# Patient Record
Sex: Female | Born: 1935 | Race: White | Hispanic: No | Marital: Married | Smoking: Never smoker
Health system: Southern US, Community
[De-identification: ages and names within clinical notes are randomized; demographics above are authoritative.]

## PROBLEM LIST (undated history)

## (undated) DIAGNOSIS — IMO0001 Reserved for inherently not codable concepts without codable children: Secondary | ICD-10-CM

## (undated) DIAGNOSIS — I10 Essential (primary) hypertension: Secondary | ICD-10-CM

## (undated) DIAGNOSIS — Z972 Presence of dental prosthetic device (complete) (partial): Secondary | ICD-10-CM

## (undated) DIAGNOSIS — E039 Hypothyroidism, unspecified: Secondary | ICD-10-CM

## (undated) DIAGNOSIS — Z8639 Personal history of other endocrine, nutritional and metabolic disease: Secondary | ICD-10-CM

## (undated) DIAGNOSIS — Z974 Presence of external hearing-aid: Secondary | ICD-10-CM

## (undated) DIAGNOSIS — E079 Disorder of thyroid, unspecified: Secondary | ICD-10-CM

## (undated) DIAGNOSIS — K2270 Barrett's esophagus without dysplasia: Secondary | ICD-10-CM

## (undated) DIAGNOSIS — L814 Other melanin hyperpigmentation: Secondary | ICD-10-CM

## (undated) DIAGNOSIS — K219 Gastro-esophageal reflux disease without esophagitis: Secondary | ICD-10-CM

## (undated) DIAGNOSIS — I499 Cardiac arrhythmia, unspecified: Secondary | ICD-10-CM

## (undated) DIAGNOSIS — G459 Transient cerebral ischemic attack, unspecified: Secondary | ICD-10-CM

## (undated) DIAGNOSIS — I4891 Unspecified atrial fibrillation: Secondary | ICD-10-CM

## (undated) HISTORY — DX: Transient cerebral ischemic attack, unspecified: G45.9

## (undated) HISTORY — DX: Barrett's esophagus without dysplasia: K22.70

## (undated) HISTORY — DX: Disorder of thyroid, unspecified: E07.9

## (undated) HISTORY — PX: THYROID SURGERY: SHX805

## (undated) HISTORY — DX: Gastro-esophageal reflux disease without esophagitis: K21.9

## (undated) HISTORY — DX: Reserved for inherently not codable concepts without codable children: IMO0001

## (undated) HISTORY — DX: Personal history of other endocrine, nutritional and metabolic disease: Z86.39

## (undated) HISTORY — DX: Other melanin hyperpigmentation: L81.4

## (undated) HISTORY — PX: ABDOMINAL HYSTERECTOMY: SHX81

---

## 2004-10-12 ENCOUNTER — Emergency Department: Payer: Self-pay | Admitting: Emergency Medicine

## 2005-03-18 ENCOUNTER — Ambulatory Visit: Payer: Self-pay | Admitting: Internal Medicine

## 2006-03-26 ENCOUNTER — Ambulatory Visit: Payer: Self-pay | Admitting: Infectious Diseases

## 2006-04-18 ENCOUNTER — Other Ambulatory Visit: Payer: Self-pay

## 2006-04-18 ENCOUNTER — Ambulatory Visit: Payer: Self-pay | Admitting: Infectious Diseases

## 2006-07-22 HISTORY — PX: HERNIA REPAIR: SHX51

## 2006-12-15 ENCOUNTER — Ambulatory Visit: Payer: Self-pay | Admitting: Surgery

## 2006-12-22 ENCOUNTER — Ambulatory Visit: Payer: Self-pay | Admitting: Surgery

## 2007-04-01 ENCOUNTER — Ambulatory Visit: Payer: Self-pay | Admitting: Internal Medicine

## 2008-04-22 ENCOUNTER — Ambulatory Visit: Payer: Self-pay | Admitting: Internal Medicine

## 2009-03-30 ENCOUNTER — Ambulatory Visit: Payer: Self-pay | Admitting: Family

## 2009-04-17 LAB — HM COLONOSCOPY: HM Colonoscopy: NORMAL

## 2009-04-20 ENCOUNTER — Ambulatory Visit: Payer: Self-pay | Admitting: Gastroenterology

## 2009-05-16 ENCOUNTER — Ambulatory Visit: Payer: Self-pay | Admitting: Internal Medicine

## 2009-10-11 ENCOUNTER — Ambulatory Visit: Payer: Self-pay | Admitting: Internal Medicine

## 2010-05-18 ENCOUNTER — Ambulatory Visit: Payer: Self-pay | Admitting: Internal Medicine

## 2010-05-28 ENCOUNTER — Ambulatory Visit: Payer: Self-pay | Admitting: Gastroenterology

## 2010-12-21 DIAGNOSIS — G459 Transient cerebral ischemic attack, unspecified: Secondary | ICD-10-CM

## 2010-12-21 HISTORY — DX: Transient cerebral ischemic attack, unspecified: G45.9

## 2011-01-08 ENCOUNTER — Ambulatory Visit: Payer: Self-pay | Admitting: Internal Medicine

## 2011-01-14 ENCOUNTER — Ambulatory Visit: Payer: Self-pay | Admitting: Vascular Surgery

## 2011-01-20 DIAGNOSIS — Z8639 Personal history of other endocrine, nutritional and metabolic disease: Secondary | ICD-10-CM

## 2011-01-20 HISTORY — DX: Personal history of other endocrine, nutritional and metabolic disease: Z86.39

## 2011-02-04 ENCOUNTER — Ambulatory Visit: Payer: Self-pay | Admitting: Internal Medicine

## 2011-02-19 ENCOUNTER — Ambulatory Visit: Payer: Self-pay | Admitting: Unknown Physician Specialty

## 2011-02-20 LAB — PATHOLOGY REPORT

## 2011-03-28 ENCOUNTER — Ambulatory Visit (INDEPENDENT_AMBULATORY_CARE_PROVIDER_SITE_OTHER): Payer: 59 | Admitting: Internal Medicine

## 2011-03-28 VITALS — BP 169/91 | HR 88

## 2011-03-28 DIAGNOSIS — I10 Essential (primary) hypertension: Secondary | ICD-10-CM

## 2011-03-28 NOTE — Progress Notes (Signed)
  Subjective:    Patient ID: Tiffany Grimes, female    DOB: [birth year], 77 y.o.   MRN: [id number]  HPI    Review of Systems     Objective:   Physical Exam        Assessment & Plan:

## 2011-03-29 DIAGNOSIS — I10 Essential (primary) hypertension: Secondary | ICD-10-CM | POA: Insufficient documentation

## 2011-04-01 ENCOUNTER — Encounter: Payer: Self-pay | Admitting: Internal Medicine

## 2011-04-01 ENCOUNTER — Ambulatory Visit (INDEPENDENT_AMBULATORY_CARE_PROVIDER_SITE_OTHER): Payer: 59 | Admitting: Internal Medicine

## 2011-04-01 DIAGNOSIS — E785 Hyperlipidemia, unspecified: Secondary | ICD-10-CM

## 2011-04-01 DIAGNOSIS — Z0000 Encounter for general adult medical examination without abnormal findings: Secondary | ICD-10-CM

## 2011-04-01 DIAGNOSIS — R5383 Other fatigue: Secondary | ICD-10-CM

## 2011-04-01 DIAGNOSIS — Z79899 Other long term (current) drug therapy: Secondary | ICD-10-CM

## 2011-04-01 LAB — COMPREHENSIVE METABOLIC PANEL
AST: 23 U/L (ref 0–37)
Alkaline Phosphatase: 48 U/L (ref 39–117)
BUN: 11 mg/dL (ref 6–23)
Creatinine, Ser: 0.9 mg/dL (ref 0.4–1.2)
Glucose, Bld: 107 mg/dL — ABNORMAL HIGH (ref 70–99)
Total Bilirubin: 0.8 mg/dL (ref 0.3–1.2)

## 2011-04-01 LAB — LIPID PANEL
Cholesterol: 172 mg/dL (ref 0–200)
HDL: 65.2 mg/dL (ref >39.00)
LDL Cholesterol: 88 mg/dL (ref 0–99)
Triglycerides: 92 mg/dL (ref 0.0–149.0)
VLDL: 18.4 mg/dL (ref 0.0–40.0)

## 2011-04-01 LAB — TSH: TSH: 6.53 u[IU]/mL — ABNORMAL HIGH (ref 0.35–5.50)

## 2011-04-01 NOTE — Patient Instructions (Addendum)
Consider a trial of compression knee highs to manage your swollen feet due to venous insufficiency.  They can be purchased at [place] or by a company [employer], clinical  .  They have an online store in [place].  [email or web address]  Your blood pressure is fine.  Continue all current medications.  We will call you with the results of your labs.

## 2011-04-01 NOTE — Progress Notes (Signed)
Subjective:    Patient ID: Tiffany Grimes, female    DOB: [birth year], 77 y.o.   MRN: [id number]  HPI    Review of Systems     Objective:   Physical Exam        Assessment & Plan:  [id number] Subjective:    Tiffany Grimes is a 77 y.o. female who presents for Medicare Annual/Subsequent preventive examination.  Preventive Screening-Counseling & Management  Tobacco History  Smoking status  . Never Smoker   Smokeless tobacco  . Never Used     Problems Prior to Visit 1. TIA May 2012  2.  Thyroid nodule, resected June 2012. 3. New onset Hypertension 4) Hyperlipidemia 5) Vaginal atrophy  Current Problems (verified) Patient Active Problem List  Diagnoses  . Hypertension    Medications Prior to Visit Current Outpatient Prescriptions on File Prior to Visit  Medication Sig Dispense Refill  . amLODipine-benazepril (LOTREL) 5-20 MG per capsule Take 1 capsule by mouth 2 (two) times daily.        Marland Kitchen aspirin 81 MG tablet Take 81 mg by mouth daily.        Marland Kitchen atenolol (TENORMIN) 25 MG tablet Take 25 mg by mouth daily.        Marland Kitchen atorvastatin (LIPITOR) 10 MG tablet Take 10 mg by mouth daily.        . brimonidine (ALPHAGAN P) 0.1 % SOLN Place 1 drop into both eyes 2 (two) times daily.        Marland Kitchen esomeprazole (NEXIUM) 40 MG capsule Take 40 mg by mouth daily before breakfast.        . latanoprost (XALATAN) 0.005 % ophthalmic solution Place 1 drop into both eyes 2 (two) times daily.        . sucralfate (CARAFATE) 1 G tablet Take 1 g by mouth 2 (two) times daily.          Current Medications (verified) Current Outpatient Prescriptions  Medication Sig Dispense Refill  . amLODipine-benazepril (LOTREL) 5-20 MG per capsule Take 1 capsule by mouth 2 (two) times daily.        Marland Kitchen aspirin 81 MG tablet Take 81 mg by mouth daily.        Marland Kitchen atenolol (TENORMIN) 25 MG tablet Take 25 mg by mouth daily.        Marland Kitchen atorvastatin (LIPITOR) 10 MG tablet Take 10 mg by mouth daily.        . brimonidine (ALPHAGAN P)  0.1 % SOLN Place 1 drop into both eyes 2 (two) times daily.        Marland Kitchen conjugated estrogens (PREMARIN) vaginal cream Place vaginally daily.        Marland Kitchen esomeprazole (NEXIUM) 40 MG capsule Take 40 mg by mouth daily before breakfast.        . latanoprost (XALATAN) 0.005 % ophthalmic solution Place 1 drop into both eyes 2 (two) times daily.        . sucralfate (CARAFATE) 1 G tablet Take 1 g by mouth 2 (two) times daily.           Allergies (verified) Neomycin   PAST HISTORY  Family History No family history on file.  Social History History  Substance Use Topics  . Smoking status: Never Smoker   . Smokeless tobacco: Never Used  . Alcohol Use: No     Are there smokers in your home (other than you)? No  Risk Factors Current exercise habits: Home exercise routine includes walking 1 hrs per day.  Dietary issues discussed: no deficiencies,  Balanced diet   Cardiac risk factors: advanced age (older than 29 for men, 49 for women), dyslipidemia and hypertension.  Depression Screen (Note: if answer to either of the following is "Yes", a more complete depression screening is indicated)   Over the past two weeks, have you felt down, depressed or hopeless? No  Over the past two weeks, have you felt little interest or pleasure in doing things? No  Have you lost interest or pleasure in daily life? No  Do you often feel hopeless? No  Do you cry easily over simple problems? No  Activities of Daily Living In your present state of health, do you have any difficulty performing the following activities?:  Driving? No Managing money?  No Feeding yourself? No Getting from bed to chair? No Climbing a flight of stairs? No Preparing food and eating?: No Bathing or showering? No Getting dressed: No Getting to the toilet? No Using the toilet:No Moving around from place to place: No In the past year have you fallen or had a near fall?:No   Are you sexually active?  No  Do you have more than one  partner?  No  Hearing Difficulties: Yes Do you often ask people to speak up or repeat themselves? No Do you experience ringing or noises in your ears? No Do you have difficulty understanding soft or whispered voices? Yes   Do you feel that you have a problem with memory? No  Do you often misplace items? No  Do you feel safe at home?  No  Cognitive Testing  Alert? Yes  Normal Appearance?Yes  Oriented to person? Yes  Place? Yes   Time? Yes  Recall of three objects?  Yes  Can perform simple calculations? Yes  Displays appropriate judgment?Yes  Can read the correct time from a watch face?Yes   Advanced Directives have been discussed with the patient? Yes  List the Names of Other Physician/Practitioners you currently use: 1.    Indicate any recent Medical Services you may have received from other than [facility] providers in the past year (date may be approximate).   There is no immunization history on file for this patient.  Screening Tests Health Maintenance  Topic Date Due  . Tetanus/tdap  11/01/1949  . Colonoscopy  11/01/1980  . Zostavax  11/02/1990  . Pneumococcal Polysaccharide Vaccine Age 85 And Over  11/02/1995  . Influenza Vaccine  04/22/2011    All answers were reviewed with the patient and necessary referrals were made:  Duncan Dull, MD   04/01/2011   History reviewed: allergies, current medications, past family history, past medical history, past social history, past surgical history and problem list  Review of Systems A comprehensive review of systems was negative.    Objective:     Vision by Snellen chart: right WGN:FAOZHYQ declines measurement, left eye:as she has an opthalmology appt tomorrow with Dr. Oren Bracket  Body mass index is 19.45 kg/(m^2). BP 135/77  Pulse 69  Temp(Src) 97.6 F (36.4 C) (Oral)  Resp 16  Ht 5\' 6"  (1.676 m)  Wt 120 lb 8 oz (54.658 kg)  BMI 19.45 kg/m2  SpO2 98%  BP 135/77  Pulse 69  Temp(Src) 97.6 F (36.4 C) (Oral)  Resp 16  Ht  5\' 6"  (1.676 m)  Wt 120 lb 8 oz (54.658 kg)  BMI 19.45 kg/m2  SpO2 98% General appearance: alert and cooperative Head: Normocephalic, without obvious abnormality, atraumatic Eyes: conjunctivae/corneas clear. PERRL, EOM's intact. Fundi benign. Ears:  normal TM's and external ear canals both ears Nose: Nares normal. Septum midline. Mucosa normal. No drainage or sinus tenderness. Throat: lips, mucosa, and tongue normal; teeth and gums normal Neck: no adenopathy, no carotid bruit, no JVD, supple, symmetrical, trachea midline and thyroid not enlarged, symmetric, no tenderness/mass/nodules Back: symmetric, no curvature. ROM normal. No CVA tenderness. Lungs: clear to auscultation bilaterally Breasts: normal appearance, no masses or tenderness Heart: regular rate and rhythm, S1, S2 normal, no murmur, click, rub or gallop Abdomen: soft, non-tender; bowel sounds normal; no masses,  no organomegaly Extremities: extremities normal, atraumatic, no cyanosis or edema Pulses: 2+ and symmetric Skin: Skin color, texture, turgor normal. No rashes or lesions Lymph nodes: Cervical, supraclavicular, and axillary nodes normal. Neurologic: Grossly normal     Assessment:     Screening for breast Ca:  Patient is up to date with annual mammograms. Screening for colon CA:  Patient is up to date with colonoscoopy.      Plan:     During the course of the visit the patient was educated and counseled about appropriate screening and preventive services including:    Pneumococcal vaccine   Influenza vaccine  Td vaccine  Bone densitometry screening  Colorectal cancer screening  Advanced directives: has NO advanced directive  - add't info requested. Referral to SW: no  Diet review for nutrition referral? Yes ____  Not Indicated _x___   Patient Instructions (the written plan) was given to the patient.  Medicare Attestation I have personally reviewed: The patient's medical and social history Their  use of alcohol, tobacco or illicit drugs Their current medications and supplements The patient's functional ability including ADLs,fall risks, home safety risks, cognitive, and hearing and visual impairment Diet and physical activities Evidence for depression or mood disorders  The patient's weight, height, BMI, and visual acuity have been recorded in the chart.  I have made referrals, counseling, and provided education to the patient based on review of the above and I have provided the patient with a written personalized care plan for preventive services.     Duncan Dull, MD   04/01/2011

## 2011-04-03 ENCOUNTER — Encounter: Payer: Self-pay | Admitting: Internal Medicine

## 2011-04-04 MED ORDER — LEVOTHYROXINE SODIUM 50 MCG PO TABS: 50.0000 ug | ORAL_TABLET | Freq: Every day | ORAL | Status: DC

## 2011-04-04 NOTE — Progress Notes (Signed)
Addended by: Darletta Moll A on: 04/04/2011 02:54 PM   Modules accepted: Orders

## 2011-04-05 ENCOUNTER — Other Ambulatory Visit: Payer: Self-pay | Admitting: Internal Medicine

## 2011-04-05 DIAGNOSIS — E039 Hypothyroidism, unspecified: Secondary | ICD-10-CM

## 2011-04-05 MED ORDER — LEVOTHYROXINE SODIUM 50 MCG PO TABS: 50.0000 ug | ORAL_TABLET | Freq: Every day | ORAL | Status: DC

## 2011-04-05 NOTE — Telephone Encounter (Signed)
Pt walked in  Asking to get a wrtitten thyroid rx .  She canceled her rx at [facility].  She wants to pick up the rx to mail.  Please call when ready

## 2011-04-05 NOTE — Telephone Encounter (Signed)
Patient wants to pick up rx so that she can mail it in for mail order pharmacy. She has cancelled her rx at [facility].

## 2011-04-09 ENCOUNTER — Other Ambulatory Visit: Payer: Self-pay | Admitting: Internal Medicine

## 2011-04-09 DIAGNOSIS — E039 Hypothyroidism, unspecified: Secondary | ICD-10-CM

## 2011-04-09 MED ORDER — LEVOTHYROXINE SODIUM 50 MCG PO TABS: 50.0000 ug | ORAL_TABLET | Freq: Every day | ORAL | Status: DC

## 2011-05-18 LAB — HM MAMMOGRAPHY: HM Mammogram: NORMAL

## 2011-05-20 ENCOUNTER — Ambulatory Visit: Payer: Self-pay | Admitting: Internal Medicine

## 2011-05-29 ENCOUNTER — Encounter: Payer: Self-pay | Admitting: Internal Medicine

## 2011-06-06 ENCOUNTER — Other Ambulatory Visit: Payer: Self-pay | Admitting: Internal Medicine

## 2011-06-06 MED ORDER — GLUCOSE BLOOD VI DISK: DISK | Status: DC

## 2011-07-05 ENCOUNTER — Telehealth: Payer: Self-pay | Admitting: *Deleted

## 2011-07-05 MED ORDER — ATORVASTATIN CALCIUM 10 MG PO TABS: 10.0000 mg | ORAL_TABLET | Freq: Every day | ORAL | Status: DC

## 2011-07-05 NOTE — Telephone Encounter (Signed)
Pt has ran out of her Lipitor, she states she normally uses mail order. [facility] [address] [facility] she gets her mail order prescription. Please Advise

## 2011-07-05 NOTE — Telephone Encounter (Signed)
Ok to refill #30lipitor locally  bc she will pay the same if i rx 10 or 30 and she might run out again.

## 2011-07-26 ENCOUNTER — Telehealth: Payer: Self-pay | Admitting: Internal Medicine

## 2011-07-26 DIAGNOSIS — Z79899 Other long term (current) drug therapy: Secondary | ICD-10-CM

## 2011-07-26 DIAGNOSIS — E039 Hypothyroidism, unspecified: Secondary | ICD-10-CM

## 2011-07-26 DIAGNOSIS — E785 Hyperlipidemia, unspecified: Secondary | ICD-10-CM

## 2011-07-26 MED ORDER — ATORVASTATIN CALCIUM 20 MG PO TABS: 20.0000 mg | ORAL_TABLET | Freq: Every day | ORAL | Status: DC

## 2011-07-26 NOTE — Telephone Encounter (Signed)
See note below.  I will add labs to EPIc

## 2011-07-26 NOTE — Telephone Encounter (Signed)
I spoke w/pt - She explained that she had TIA in the past and becomes concerned when she has any strange symptoms. One week ago pt was reading and describes to me that her heart "cut off" for a second. She took a deep breath and it "restarted". Last night she woke up w/her eye twitching and became very concerned due to previous TIA. Eye symptoms only lasted a brief moment and then stopped. She feels ok this am and has no complaints at the moment.   I explained the call a nurse service for after hours assistance/advice. I also advised her to monitor symptoms and if severe call 911, if mild to call office to discuss. She is scheduled for OV next week for eval from MD. She left office w/no further questions or concerns.

## 2011-07-26 NOTE — Telephone Encounter (Signed)
Pt came in today wanting to get a refill on her gernic liptor the last visit dr Darrick Huntsman to take 2 daily and her last rx said 1 day pt has about 15 days left.   She would like to pick up this rx so she can send her self.  She has questions of wheither she needs still take 2 daily or cut back to one.    She has an appointment on 08/07/11 but would like to come in sooner if possible  She said she is have heart flutters.  Had nurse talk to pt

## 2011-07-26 NOTE — Telephone Encounter (Signed)
Yes she needs to continue 20 mg lipitor ,  rx called to pharmacy,  Will need fasting lipids and CMET prior to next visit.

## 2011-07-26 NOTE — Telephone Encounter (Signed)
Addended by: Duncan Dull on: 07/26/2011 12:35 PM   Modules accepted: Orders

## 2011-07-29 ENCOUNTER — Other Ambulatory Visit: Payer: Self-pay | Admitting: *Deleted

## 2011-07-29 DIAGNOSIS — E785 Hyperlipidemia, unspecified: Secondary | ICD-10-CM

## 2011-07-29 NOTE — Telephone Encounter (Signed)
Patient notified. Lab appt scheduled for tomorrow

## 2011-07-29 NOTE — Telephone Encounter (Signed)
Pt would like to pick up written 90 days script for atorvastatin to mail to her pharmacy.  This was to have already been done, but it looks [facility].

## 2011-07-30 ENCOUNTER — Other Ambulatory Visit (INDEPENDENT_AMBULATORY_CARE_PROVIDER_SITE_OTHER): Payer: 59 | Admitting: *Deleted

## 2011-07-30 DIAGNOSIS — E785 Hyperlipidemia, unspecified: Secondary | ICD-10-CM

## 2011-07-30 DIAGNOSIS — Z79899 Other long term (current) drug therapy: Secondary | ICD-10-CM

## 2011-07-30 DIAGNOSIS — E039 Hypothyroidism, unspecified: Secondary | ICD-10-CM

## 2011-07-30 LAB — COMPREHENSIVE METABOLIC PANEL
ALT: 17 U/L (ref 0–35)
CO2: 31 mEq/L (ref 19–32)
Calcium: 9 mg/dL (ref 8.4–10.5)
Chloride: 103 mEq/L (ref 96–112)
GFR: 57.26 mL/min — ABNORMAL LOW (ref >60.00)
Potassium: 3.9 mEq/L (ref 3.5–5.1)
Sodium: 141 mEq/L (ref 135–145)
Total Protein: 6.5 g/dL (ref 6.0–8.3)

## 2011-07-30 LAB — LIPID PANEL: Total CHOL/HDL Ratio: 3

## 2011-07-30 MED ORDER — ATORVASTATIN CALCIUM 20 MG PO TABS: 20.0000 mg | ORAL_TABLET | Freq: Every day | ORAL | Status: DC

## 2011-07-31 ENCOUNTER — Ambulatory Visit (INDEPENDENT_AMBULATORY_CARE_PROVIDER_SITE_OTHER): Payer: 59 | Admitting: Internal Medicine

## 2011-07-31 ENCOUNTER — Encounter: Payer: Self-pay | Admitting: Internal Medicine

## 2011-07-31 ENCOUNTER — Ambulatory Visit: Payer: 59 | Admitting: Internal Medicine

## 2011-07-31 VITALS — BP 122/60 | HR 74 | Temp 98.3°F | Wt 124.0 lb

## 2011-07-31 DIAGNOSIS — I499 Cardiac arrhythmia, unspecified: Secondary | ICD-10-CM

## 2011-07-31 DIAGNOSIS — G459 Transient cerebral ischemic attack, unspecified: Secondary | ICD-10-CM | POA: Insufficient documentation

## 2011-07-31 DIAGNOSIS — R0789 Other chest pain: Secondary | ICD-10-CM

## 2011-07-31 DIAGNOSIS — I10 Essential (primary) hypertension: Secondary | ICD-10-CM

## 2011-07-31 DIAGNOSIS — E785 Hyperlipidemia, unspecified: Secondary | ICD-10-CM

## 2011-07-31 DIAGNOSIS — K2270 Barrett's esophagus without dysplasia: Secondary | ICD-10-CM | POA: Insufficient documentation

## 2011-07-31 MED ORDER — ATORVASTATIN CALCIUM 20 MG PO TABS: 20.0000 mg | ORAL_TABLET | Freq: Every day | ORAL | Status: DC

## 2011-07-31 NOTE — Patient Instructions (Signed)
Look in your insurance formulary for a cheaper alternative to Nexium, such as Aciphex, Protonix, Dexilzant or Prevacid   Labs today  EKG today.

## 2011-07-31 NOTE — Progress Notes (Signed)
Subjective:    Patient ID: Tiffany Grimes, female    DOB: 1929/02/26, 78 y.o.   MRN: [id number]  HPI  Tiffany Grimes is a healthy 78 yr old white female with a history of hypothyrodiism, hypetension, menopausal syndreome on HRT, and TIA in June who presents with several issues.  Since her TIA she has felt increased fatigue, which is slowly improving. She has been taking atenolol twice daily since her TIA.   She has no trouble sleeping, and is independent of all ADLs.  One month ago had an episode of her entire body feeling like she was wrapped in ice, and read about this symptom on some on line website that caused her to be concerned that she was having an AMI.  Epiodse occurred while standing at the kitchen counter.  No prior exercise or strain.  Feeling lasted less than 30 secons.  No reucrrence.  Not accompanied by chest or jaw  pain, dyspnea or nausea.  3rd issue is an episode  Last week of feeling that her heart stopped for a few seconds. She was checking her pulse at the tie.  No dizziness or presyncope.  Occurred while at rest.  No recurrence.   Past Medical History  Diagnosis Date  . Thyroid disease   . TIA (transient ischemic attack) June 2012  . TIA (transient ischemic attack) June 2012  . Barrett esophagus     due for EGD July 2012, [place]  . Reflux    Past Surgical History  Procedure Date  . Thyroid surgery june 18    nodule removed   Current Outpatient Prescriptions on File Prior to Visit  Medication Sig Dispense Refill  . amLODipine-benazepril (LOTREL) 5-20 MG per capsule Take 1 capsule by mouth 2 (two) times daily.        Marland Kitchen aspirin 81 MG tablet Take 81 mg by mouth daily.        Marland Kitchen atenolol (TENORMIN) 25 MG tablet Take 25 mg by mouth daily.        . brimonidine (ALPHAGAN P) 0.1 % SOLN Place 1 drop into both eyes 2 (two) times daily.        Marland Kitchen esomeprazole (NEXIUM) 40 MG capsule Take 40 mg by mouth daily before breakfast.        . Estrogens, Conjugated (PREMARIN VA) Place 1 g  vaginally 2 (two) times a week.      . Glucose Blood (BAYER BREEZE 2 TEST) DISK Patient test blood sugar daily.  100 each  3  . latanoprost (XALATAN) 0.005 % ophthalmic solution Place 1 drop into both eyes 2 (two) times daily.        Marland Kitchen levothyroxine (SYNTHROID) 50 MCG tablet Take 1 tablet (50 mcg total) by mouth daily.  90 tablet  3  . sucralfate (CARAFATE) 1 G tablet Take 1 g by mouth 2 (two) times daily.          Review of Systems  Constitutional: Negative for fever, chills, appetite change, fatigue and unexpected weight change.  HENT: Negative for ear pain, congestion, sore throat, trouble swallowing, neck pain, voice change and sinus pressure.   Eyes: Negative for visual disturbance.  Respiratory: Negative for cough, shortness of breath, wheezing and stridor.   Cardiovascular: Negative for chest pain, palpitations and leg swelling.  Gastrointestinal: Negative for nausea, vomiting, abdominal pain, diarrhea, constipation, blood in stool, abdominal distention and anal bleeding.  Genitourinary: Negative for dysuria and flank pain.  Musculoskeletal: Negative for myalgias, arthralgias and gait problem.  Skin: Negative for color change and rash.  Neurological: Negative for dizziness and headaches.  Hematological: Negative for adenopathy. Does not bruise/bleed easily.  Psychiatric/Behavioral: Negative for suicidal ideas, sleep disturbance and dysphoric mood. The patient is not nervous/anxious.    BP 122/60  Pulse 74  Temp(Src) 98.3 F (36.8 C) (Oral)  Wt 124 lb (56.246 kg)  SpO2 98%     Objective:   Physical Exam  Constitutional: She is oriented to person, place, and time. She appears well-developed and well-nourished.  HENT:  Mouth/Throat: Oropharynx is clear and moist.  Eyes: EOM are normal. Pupils are equal, round, and reactive to light. No scleral icterus.  Neck: Normal range of motion. Neck supple. No JVD present. No thyromegaly present.  Cardiovascular: Normal rate, regular  rhythm, normal heart sounds and intact distal pulses.   Pulmonary/Chest: Effort normal and breath sounds normal.  Abdominal: Soft. Bowel sounds are normal. She exhibits no mass. There is no tenderness.  Musculoskeletal: Normal range of motion. She exhibits no edema.  Lymphadenopathy:    She has no cervical adenopathy.  Neurological: She is alert and oriented to person, place, and time.  Skin: Skin is warm and dry.  Psychiatric: She has a normal mood and affect.       Assessment & Plan:

## 2011-08-01 ENCOUNTER — Telehealth: Payer: Self-pay | Admitting: *Deleted

## 2011-08-01 NOTE — Telephone Encounter (Signed)
Pt is asking if you want her to go back to taking atenolol once a day, she has been taking it twice a day since her stroke about 6 months ago.

## 2011-08-01 NOTE — Telephone Encounter (Signed)
If we are using it for blood pressure control, it needs to be twice daily.  So she should stay on it twice daily

## 2011-08-01 NOTE — Telephone Encounter (Signed)
Patient notified

## 2011-08-02 DIAGNOSIS — I4891 Unspecified atrial fibrillation: Secondary | ICD-10-CM | POA: Insufficient documentation

## 2011-08-02 DIAGNOSIS — R0789 Other chest pain: Secondary | ICD-10-CM | POA: Insufficient documentation

## 2011-08-02 NOTE — Assessment & Plan Note (Signed)
Well controlled on current regimen on Lotensin and atenolol which she is taking twice daily.  If she has another episode of sinus pause will need to stop her beta blocker.

## 2011-08-02 NOTE — Assessment & Plan Note (Addendum)
She is describing a sinus pause which I was unable to capture on EKG. The only abnormality was a slightly prolonged PR interval.  Her electrolytes are normal and thyroid function is now  normal.  She does not want to be see by a cardiologist, which I have recommended but has agreed to a Holter monitor or Cardionet if the symptoms occurs again.  Continue atenolol for now since it is low dose and she is not bradycardic/

## 2011-08-02 NOTE — Assessment & Plan Note (Signed)
Her episode of extreme coldness occurred while at rest and should be worked up for angina. However has has refused cardiology evaluation at this time.  Continue betablocer and ACE inhibitor and ASA. Lipids are not uncontrolled, LDL is 99 and HDL is 60

## 2011-08-07 ENCOUNTER — Other Ambulatory Visit: Payer: Self-pay | Admitting: *Deleted

## 2011-08-07 ENCOUNTER — Ambulatory Visit: Payer: 59 | Admitting: Internal Medicine

## 2011-08-07 MED ORDER — ESTROGENS, CONJUGATED 0.625 MG/GM VA CREA: TOPICAL_CREAM | Freq: Every day | VAGINAL | Status: DC

## 2011-08-07 NOTE — Telephone Encounter (Signed)
Patient is requesting refill on her premarin

## 2011-08-08 ENCOUNTER — Telehealth: Payer: Self-pay | Admitting: Internal Medicine

## 2011-08-08 MED ORDER — ESTROGENS, CONJUGATED 0.625 MG/GM VA CREA: TOPICAL_CREAM | VAGINAL | Status: DC

## 2011-08-08 MED ORDER — ESTROGENS, CONJUGATED 0.625 MG/GM VA CREA: TOPICAL_CREAM | VAGINAL | Status: AC

## 2011-08-08 MED ORDER — ESTROGENS, CONJUGATED 0.625 MG/GM VA CREA: TOPICAL_CREAM | Freq: Every day | VAGINAL | Status: DC

## 2011-08-08 NOTE — Telephone Encounter (Signed)
Will call patient when rx is ready.

## 2011-08-08 NOTE — Telephone Encounter (Signed)
PT CAME IN TO GET REFILL ON PREMARIN SHE NEEDS TO SEND TO MAIL ORDER SHE NEEDS 90 SUPPLY PLEASE CALL WHEN READY

## 2011-08-08 NOTE — Telephone Encounter (Signed)
Tried printing x 2, would not print. Jacki Cones reprinted it. Will call patient after Dr. Darrick Huntsman signs it.

## 2011-08-09 NOTE — Telephone Encounter (Signed)
Patient notified that rx is ready ° °

## 2011-08-13 ENCOUNTER — Ambulatory Visit: Payer: 59 | Admitting: Internal Medicine

## 2011-08-21 ENCOUNTER — Other Ambulatory Visit: Payer: Self-pay | Admitting: Internal Medicine

## 2011-09-03 ENCOUNTER — Telehealth: Payer: Self-pay | Admitting: Internal Medicine

## 2011-09-03 DIAGNOSIS — E785 Hyperlipidemia, unspecified: Secondary | ICD-10-CM

## 2011-09-03 MED ORDER — ATORVASTATIN CALCIUM 20 MG PO TABS: 20.0000 mg | ORAL_TABLET | Freq: Every day | ORAL | Status: DC

## 2011-09-03 NOTE — Telephone Encounter (Signed)
[phone] Pt came in and wants to get refill on atorvastatun tabs 10mg   Take 1 tablet daily Pt wants 90 supply [facility] Please advise pt when this is called in

## 2011-10-01 ENCOUNTER — Telehealth: Payer: Self-pay | Admitting: Internal Medicine

## 2011-10-01 NOTE — Telephone Encounter (Signed)
Patient has a sinus infection would like to be seen [facility] [address]

## 2011-10-01 NOTE — Telephone Encounter (Signed)
I called call a nurse to triage the call.

## 2011-10-01 NOTE — Telephone Encounter (Signed)
This kind of call is supposed to be triaged to call a nurse,  Please have patient speak to Call  A Nurse

## 2011-10-02 ENCOUNTER — Ambulatory Visit: Payer: 59 | Admitting: Internal Medicine

## 2011-10-02 ENCOUNTER — Telehealth: Payer: Self-pay | Admitting: *Deleted

## 2011-10-02 NOTE — Telephone Encounter (Signed)
Triage Record Num: [id number] Operator: Aundra Millet Patient Name: Tiffany Grimes Call Date & Time: 10/01/2011 2:41:06PM Patient Phone: [phone] PCP: Duncan Dull Patient Gender: Female PCP Fax : [phone] Patient DOB: 1929-02-14 Practice Name: [facility] Reason for Call: Caller: Alvina/Patient; PCP: Duncan Dull; CB#: [phone]; ; ; Call regarding Sinus Problem ... (dr. Darrick Huntsman s Office, [place] To Ask [place] To Triage, Pt . They Have No More Appt.s Today Unless You Think Pt. Needs To Be Seen Today); Pt started with sore throat 09/23/2011 and then since 09/26/2011 has had sinus drainage and blowing nose. No fever. Cough occasional and coughs white/ yellow colored sputum. Pt is requesting a Z pak . RN reached See in 24 hrs for productive cough per Upper Respiratory Infection and adv OV. No appts available today nor tomorrow. RN called office and spoke to "Robin" and appt opened for 1015 with Dr Darrick Huntsman 10/02/2011 . Pt declines scheduling appt b/c church conflict and wants to wait and will call back tomorrow if not better with continued care advice. Protocol(s) Used: Upper Respiratory Infection (URI) Recommended Outcome per Protocol: See Provider within 24 hours Reason for Outcome: Productive cough with colored sputum (other than clear or white sputum) Care Advice: ~ Use a cool mist humidifier to moisten air. Be sure to clean according to manufacturer's instructions. ~ May inhale steam from hot shower or heated water. Be careful to avoid burns. Increase fluids to 8-12 eight oz (1.6 to 2.4 liters) glasses per day, half of them to be water. Soups, popsicles, fruit juices, non-caffeinated sodas (unless restricting sodium intake), jello, broths, decaf teas, etc. are all okay. Warm fluids can be soothing. ~ ~ Warm fluids may help, or try a mixture of honey and lemon juice in warm tea. ~ SYMPTOM / CONDITION MANAGEMENT Coughing up mucus or phlegm helps to get rid of an  infection. A productive cough should not be stopped. A cough medicine with guaifenesin (Robitussin, Mucinex) can help loosen the mucus. Cough medicine with dextromethorphan (DM) should be avoided. Drinking lots of fluids can help loosen the mucus too, especially warm fluids. ~ ~ Go to the ED if having chest pain with breathing or breathing is becoming more difficult. Call provider if has a fever over 101.5 F (38.6 C) that has not responded to home care measures, having shaking chills or any fever in someone immunocompromised/frail elderly. ~ 10/01/2011 3:08:35PM Page 1 of 1 [email or web address]

## 2011-10-03 NOTE — Telephone Encounter (Signed)
Patient had appt scheduled to see Dr. Darrick Huntsman, but she called and canceled.

## 2011-10-07 ENCOUNTER — Encounter: Payer: Self-pay | Admitting: Internal Medicine

## 2011-10-07 ENCOUNTER — Ambulatory Visit (INDEPENDENT_AMBULATORY_CARE_PROVIDER_SITE_OTHER): Payer: 59 | Admitting: Internal Medicine

## 2011-10-07 VITALS — BP 116/62 | HR 75 | Temp 97.5°F | Resp 16 | Wt 127.0 lb

## 2011-10-07 DIAGNOSIS — H6690 Otitis media, unspecified, unspecified ear: Secondary | ICD-10-CM

## 2011-10-07 DIAGNOSIS — H6691 Otitis media, unspecified, right ear: Secondary | ICD-10-CM

## 2011-10-07 MED ORDER — AMOXICILLIN-POT CLAVULANATE 875-125 MG PO TABS: 1.0000 | ORAL_TABLET | Freq: Two times a day (BID) | ORAL | Status: AC

## 2011-10-07 NOTE — Assessment & Plan Note (Signed)
Symptoms and exam consistent with right otitis media and early sinusitis. Will treat with augmentin x 10 days. Pt will continue nasal saline washes.  She will avoid sudafed because of HTN. Follow up if symptoms not improving over next 48hr.

## 2011-10-07 NOTE — Progress Notes (Signed)
Subjective:    Patient ID: Tiffany Grimes, female    DOB: 1928-12-09, 78 y.o.   MRN: [id number]  HPI 78YO female with h/o sinusitis and hearing loss presents for acute visit c/o 1.5 week h/o sinus drainage, pressure and bilateral ear pain.  Also notes non-productive cough, no shortness of breath, no chest pain.  Denies fever or chills. Notes some sinus pressure.  Has been using nasal saline washes with minimal improvement.  Outpatient Encounter Prescriptions as of 10/07/2011  Medication Sig Dispense Refill  . amLODipine-benazepril (LOTREL) 5-20 MG per capsule Take 1 capsule by mouth 2 (two) times daily.        Marland Kitchen aspirin 81 MG tablet Take 81 mg by mouth daily.        Marland Kitchen atenolol (TENORMIN) 25 MG tablet TAKE 1 TABLET TWICE A DAY  180 tablet  2  . atorvastatin (LIPITOR) 20 MG tablet Take 1 tablet (20 mg total) by mouth daily.  90 tablet  3  . brimonidine (ALPHAGAN P) 0.1 % SOLN Place 1 drop into both eyes 2 (two) times daily.        Marland Kitchen conjugated estrogens (PREMARIN) vaginal cream Place vaginally 2 (two) times a week. Place vaginally, 1 gm 2 times weekly  127.5 g  3  . esomeprazole (NEXIUM) 40 MG capsule Take 40 mg by mouth daily before breakfast.        . Glucose Blood (BAYER BREEZE 2 TEST) DISK Patient test blood sugar daily.  100 each  3  . latanoprost (XALATAN) 0.005 % ophthalmic solution Place 1 drop into both eyes 2 (two) times daily.        Marland Kitchen levothyroxine (SYNTHROID) 50 MCG tablet Take 1 tablet (50 mcg total) by mouth daily.  90 tablet  3  . sucralfate (CARAFATE) 1 G tablet Take 1 g by mouth 2 (two) times daily.        Marland Kitchen amoxicillin-clavulanate (AUGMENTIN) 875-125 MG per tablet Take 1 tablet by mouth 2 (two) times daily.  20 tablet  0  . DISCONTD: conjugated estrogens (PREMARIN) vaginal cream Place vaginally daily. 1 gm 2 times weekly  128 g  3  . DISCONTD: Estrogens, Conjugated (PREMARIN VA) Place 1 g vaginally 2 (two) times a week.        Review of Systems  Constitutional: Negative for  fever, chills and unexpected weight change.  HENT: Positive for ear pain, congestion and sinus pressure. Negative for hearing loss, nosebleeds, sore throat, facial swelling, rhinorrhea, sneezing, mouth sores, trouble swallowing, neck pain, neck stiffness, voice change, postnasal drip, tinnitus and ear discharge.   Eyes: Negative for pain, discharge, redness and visual disturbance.  Respiratory: Positive for cough. Negative for chest tightness, shortness of breath, wheezing and stridor.   Cardiovascular: Negative for chest pain, palpitations and leg swelling.  Musculoskeletal: Negative for myalgias and arthralgias.  Skin: Negative for color change and rash.  Neurological: Negative for dizziness, weakness, light-headedness and headaches.  Hematological: Negative for adenopathy.   BP 116/62  Pulse 75  Temp(Src) 97.5 F (36.4 C) (Oral)  Resp 16  Wt 127 lb (57.607 kg)  SpO2 100%     Objective:   Physical Exam  Constitutional: She is oriented to person, place, and time. She appears well-developed and well-nourished. No distress.  HENT:  Head: Normocephalic and atraumatic.  Right Ear: External ear normal. Tympanic membrane is scarred and retracted. A middle ear effusion is present.  Left Ear: External ear normal. Tympanic membrane is not retracted and not bulging.  A middle ear effusion is present.  Nose: Nose normal.  Mouth/Throat: Oropharynx is clear and moist. No oropharyngeal exudate.  Eyes: Conjunctivae are normal. Pupils are equal, round, and reactive to light. Right eye exhibits no discharge. Left eye exhibits no discharge. No scleral icterus.  Neck: Normal range of motion. Neck supple. No tracheal deviation present. No thyromegaly present.  Cardiovascular: Normal rate, regular rhythm, normal heart sounds and intact distal pulses.  Exam reveals no gallop and no friction rub.   No murmur heard. Pulmonary/Chest: Effort normal and breath sounds normal. No respiratory distress. She has no  wheezes. She has no rales. She exhibits no tenderness.  Musculoskeletal: Normal range of motion. She exhibits no edema and no tenderness.  Lymphadenopathy:    She has no cervical adenopathy.  Neurological: She is alert and oriented to person, place, and time. No cranial nerve deficit. She exhibits normal muscle tone. Coordination normal.  Skin: Skin is warm and dry. No rash noted. She is not diaphoretic. No erythema. No pallor.  Psychiatric: She has a normal mood and affect. Her behavior is normal. Judgment and thought content normal.          Assessment & Plan:

## 2011-10-24 ENCOUNTER — Other Ambulatory Visit: Payer: Self-pay | Admitting: Internal Medicine

## 2011-10-24 MED ORDER — ESOMEPRAZOLE MAGNESIUM 40 MG PO CPDR: 40.0000 mg | DELAYED_RELEASE_CAPSULE | Freq: Every day | ORAL | Status: DC

## 2011-12-12 ENCOUNTER — Ambulatory Visit (INDEPENDENT_AMBULATORY_CARE_PROVIDER_SITE_OTHER): Payer: 59 | Admitting: Internal Medicine

## 2011-12-12 ENCOUNTER — Telehealth: Payer: Self-pay | Admitting: Internal Medicine

## 2011-12-12 ENCOUNTER — Encounter: Payer: Self-pay | Admitting: Internal Medicine

## 2011-12-12 VITALS — BP 130/66 | HR 69 | Temp 98.1°F | Resp 14 | Wt 125.5 lb

## 2011-12-12 DIAGNOSIS — K2270 Barrett's esophagus without dysplasia: Secondary | ICD-10-CM

## 2011-12-12 DIAGNOSIS — I10 Essential (primary) hypertension: Secondary | ICD-10-CM

## 2011-12-12 DIAGNOSIS — E785 Hyperlipidemia, unspecified: Secondary | ICD-10-CM

## 2011-12-12 DIAGNOSIS — K219 Gastro-esophageal reflux disease without esophagitis: Secondary | ICD-10-CM

## 2011-12-12 MED ORDER — ATORVASTATIN CALCIUM 20 MG PO TABS: 20.0000 mg | ORAL_TABLET | Freq: Every day | ORAL | Status: DC

## 2011-12-12 NOTE — Assessment & Plan Note (Signed)
With barretts Esopjhagus,  continue Nexium

## 2011-12-12 NOTE — Patient Instructions (Signed)
Increase your water to to 3 16 ounce servings daily  We wil call Dr. Orpah Cobb office to see you.

## 2011-12-12 NOTE — Telephone Encounter (Signed)
Pt checking on chol resutls

## 2011-12-12 NOTE — Progress Notes (Signed)
Patient ID: Tiffany Grimes, female   DOB: 1929-03-03, 78 y.o.   MRN: [id number]  Patient Active Problem List  Diagnoses  . Hypertension  . TIA (transient ischemic attack)  . Barrett esophagus  . Reflux  . Arrhythmia  . Chest pain, atypical  . Right otitis media  . Hx of thyroid nodule    Subjective:  CC:   Chief Complaint  Patient presents with  . Follow-up    HPI:   Tiffany Grimes a 78 y.o. female who presents  Past Medical History  Diagnosis Date  . Thyroid disease   . TIA (transient ischemic attack) June 2012  . TIA (transient ischemic attack) June 2012  . Barrett esophagus     due for EGD July 2012, [place]  . Reflux   . Hx of thyroid nodule July 2012    benign biopsy     Past Surgical History  Procedure Date  . Thyroid surgery june 18    nodule removed  . Hernia repair 2008    left inguinal         The following portions of the patient's history were reviewed and updated as appropriate: Allergies, current medications, and problem list.    Review of Systems:   12 Pt  review of systems was negative except those addressed in the HPI,     History   Social History  . Marital Status: Married    Spouse Name: N/A    Number of Children: N/A  . Years of Education: N/A   Occupational History  . Not on file.   Social History Main Topics  . Smoking status: Never Smoker   . Smokeless tobacco: Never Used  . Alcohol Use: No  . Drug Use: No  . Sexually Active: Not on file   Other Topics Concern  . Not on file   Social History Narrative  . No narrative on file    Objective:  BP 130/66  Pulse 69  Temp(Src) 98.1 F (36.7 C) (Oral)  Resp 14  Wt 125 lb 8 oz (56.926 kg)  SpO2 98%  General appearance: alert, cooperative and appears stated age Ears: normal TM's and external ear canals both ears Throat: lips, mucosa, and tongue normal; teeth and gums normal Neck: no adenopathy, no carotid bruit, supple, symmetrical, trachea midline and  thyroid not enlarged, symmetric, no tenderness/mass/nodules Back: symmetric, no curvature. ROM normal. No CVA tenderness. Lungs: clear to auscultation bilaterally Heart: regular rate and rhythm, S1, S2 normal, no murmur, click, rub or gallop Abdomen: soft, non-tender; bowel sounds normal; no masses,  no organomegaly Pulses: 2+ and symmetric Skin: Skin color, texture, turgor normal. No rashes or lesions Lymph nodes: Cervical, supraclavicular, and axillary nodes normal.  Assessment and Plan: Barrett esophagus By 2011 EGD by Marva Panda.  Continue Nexium daily,  Repeat  EGD 5 years  Reflux With barretts Esopjhagus,  continue Nexium   Hypertension Well controlled on current medications.  No changes today.  Other and unspecified hyperlipidemia Managed with statin therapy , gaol LDL70 given prior TIA.  Carotids were imaged by CT angiography last June and were normal.     Updated Medication List Outpatient Encounter Prescriptions as of 12/12/2011  Medication Sig Dispense Refill  . amLODipine-benazepril (LOTREL) 5-20 MG per capsule Take 1 capsule by mouth 2 (two) times daily.        Marland Kitchen aspirin 81 MG tablet Take 81 mg by mouth daily.        Marland Kitchen atenolol (TENORMIN) 25 MG tablet  TAKE 1 TABLET TWICE A DAY  180 tablet  2  . atorvastatin (LIPITOR) 20 MG tablet Take 1 tablet (20 mg total) by mouth daily.  90 tablet  3  . brimonidine (ALPHAGAN P) 0.1 % SOLN Place 1 drop into both eyes 2 (two) times daily.        Marland Kitchen conjugated estrogens (PREMARIN) vaginal cream Place vaginally 2 (two) times a week. Place vaginally, 1 gm 2 times weekly  127.5 g  3  . esomeprazole (NEXIUM) 40 MG capsule Take 1 capsule (40 mg total) by mouth daily before breakfast.  90 capsule  3  . Glucose Blood (BAYER BREEZE 2 TEST) DISK Patient test blood sugar daily.  100 each  3  . latanoprost (XALATAN) 0.005 % ophthalmic solution Place 1 drop into both eyes 2 (two) times daily.        Marland Kitchen levothyroxine (SYNTHROID) 50 MCG tablet Take 1  tablet (50 mcg total) by mouth daily.  90 tablet  3  . sucralfate (CARAFATE) 1 G tablet Take 1 g by mouth 2 (two) times daily.        Marland Kitchen DISCONTD: atorvastatin (LIPITOR) 20 MG tablet Take 1 tablet (20 mg total) by mouth daily.  90 tablet  3

## 2011-12-12 NOTE — Telephone Encounter (Signed)
Patient was seen in office today.  

## 2011-12-12 NOTE — Assessment & Plan Note (Addendum)
By 2011 EGD by Marva Panda.  Continue Nexium daily,  Repeat  EGD 5 years

## 2011-12-16 ENCOUNTER — Encounter: Payer: Self-pay | Admitting: Internal Medicine

## 2011-12-16 DIAGNOSIS — Z8639 Personal history of other endocrine, nutritional and metabolic disease: Secondary | ICD-10-CM | POA: Insufficient documentation

## 2011-12-16 DIAGNOSIS — E785 Hyperlipidemia, unspecified: Secondary | ICD-10-CM | POA: Insufficient documentation

## 2011-12-16 NOTE — Assessment & Plan Note (Signed)
Managed with statin therapy , gaol LDL70 given prior TIA.  Carotids were imaged by CT angiography last June and were normal.

## 2011-12-16 NOTE — Assessment & Plan Note (Signed)
Well controlled on current medications.  No changes today. 

## 2012-01-17 ENCOUNTER — Other Ambulatory Visit: Payer: Self-pay | Admitting: Internal Medicine

## 2012-01-17 MED ORDER — AMLODIPINE BESY-BENAZEPRIL HCL 5-20 MG PO CAPS: 1.0000 | ORAL_CAPSULE | Freq: Two times a day (BID) | ORAL | Status: DC

## 2012-02-11 ENCOUNTER — Ambulatory Visit (INDEPENDENT_AMBULATORY_CARE_PROVIDER_SITE_OTHER): Payer: 59 | Admitting: Internal Medicine

## 2012-02-11 ENCOUNTER — Encounter: Payer: Self-pay | Admitting: Internal Medicine

## 2012-02-11 VITALS — BP 112/68 | HR 72 | Temp 97.9°F | Resp 14 | Wt 121.5 lb

## 2012-02-11 DIAGNOSIS — M25551 Pain in right hip: Secondary | ICD-10-CM

## 2012-02-11 DIAGNOSIS — M79659 Pain in unspecified thigh: Secondary | ICD-10-CM

## 2012-02-11 DIAGNOSIS — M810 Age-related osteoporosis without current pathological fracture: Secondary | ICD-10-CM | POA: Insufficient documentation

## 2012-02-11 DIAGNOSIS — M5441 Lumbago with sciatica, right side: Secondary | ICD-10-CM | POA: Insufficient documentation

## 2012-02-11 DIAGNOSIS — K625 Hemorrhage of anus and rectum: Secondary | ICD-10-CM

## 2012-02-11 DIAGNOSIS — M79609 Pain in unspecified limb: Secondary | ICD-10-CM

## 2012-02-11 DIAGNOSIS — M542 Cervicalgia: Secondary | ICD-10-CM

## 2012-02-11 DIAGNOSIS — M25559 Pain in unspecified hip: Secondary | ICD-10-CM

## 2012-02-11 NOTE — Patient Instructions (Addendum)
Please try increasing the ibuprofen to 400 mg (2 capsules) every 8 hours,  And add one tylenol every 8 hours. For your neck pain    Use a heating  pad for 15 minutes twice a day on your neck   Consider getting a pillow with neck support ([employer] and [employer] have them )  Please go to the [facility] office  for xrays of hip and right thigh

## 2012-02-11 NOTE — Progress Notes (Signed)
Patient ID: RAELEE Grimes, female   DOB: 26-May-1929, 78 y.o.   MRN: [id number]  Patient Active Problem List  Diagnosis  . Hypertension  . TIA (transient ischemic attack)  . Barrett esophagus  . Reflux  . Arrhythmia  . Chest pain, atypical  . Right otitis media  . Hx of thyroid nodule  . Other and unspecified hyperlipidemia  . Osteoporosis, post-menopausal  . Hip pain, right  . Posterior neck pain  . Rectal bleeding    Subjective:  CC:   Chief Complaint  Patient presents with  . Leg Pain    HPI:   Tiffany Grimes a 78 y.o. female who presents Multiple complaints.  1) Right leg pain .  She has four episodes of sudden onset of excruciating right lateral femur/thigh pain, each episode lasted seconds .  Has occurred only while sitting on the commode. No residual pain.   Has severe osteoporosis but has deferred treatment due to Barrett's esophagus and no interest in using Prolia.  .  Hip has been hurting too,  No low back pain, numbness or tinging ,  2) right posterior neck pain with waking,  Stiff neck,  Decreased ROM.  X 4 or 5 days,  Has tried motrin 200 mg once daily,  With radiation to right shoulder and right upper arm.  But no numbness or tingling and no distal weakness or pain .  3) Chronic hemorrhoids bothering her from recent episodes of constipation,  recent bleeding of her large stool. She does not take a stool softener or fiber supplement on a regular basis. She has an appointment to see Dr. Marva Panda on the seventh.  Past Medical History  Diagnosis Date  . Thyroid disease   . TIA (transient ischemic attack) June 2012  . Barrett esophagus     due for EGD July 2012, [place]  . Reflux   . Hx of thyroid nodule July 2012    benign biopsy     Past Surgical History  Procedure Date  . Thyroid surgery june 18    nodule removed  . Hernia repair 2008    left inguinal     The following portions of the patient's history were reviewed and updated as appropriate: Allergies,  current medications, and problem list.    Review of Systems:   Review of Systems  Constitutional: Negative for weight loss and malaise/fatigue.  HENT: Positive for neck pain. Negative for nosebleeds.   Eyes: Negative.   Respiratory: Negative for hemoptysis.   Cardiovascular: Negative for chest pain.  Gastrointestinal: Positive for constipation and blood in stool. Negative for nausea.  Genitourinary: Negative.   Musculoskeletal: Positive for joint pain and falls.  Skin: Negative.   Neurological: Negative for dizziness, focal weakness and headaches.  Endo/Heme/Allergies: Does not bruise/bleed easily.  Psychiatric/Behavioral: Negative for depression. The patient does not have insomnia.        History   Social History  . Marital Status: Married    Spouse Name: N/A    Number of Children: N/A  . Years of Education: N/A   Occupational History  . Not on file.   Social History Main Topics  . Smoking status: Never Smoker   . Smokeless tobacco: Never Used  . Alcohol Use: No  . Drug Use: No  . Sexually Active: Not on file   Other Topics Concern  . Not on file   Social History Narrative  . No narrative on file    Objective:  BP 112/68  Pulse  72  Temp 97.9 F (36.6 C) (Oral)  Resp 14  Wt 121 lb 8 oz (55.112 kg)  SpO2 97%  General appearance: alert, cooperative and appears stated age Ears: normal TM's and external ear canals both ears Throat: lips, mucosa, and tongue normal; teeth and gums normal Neck: no adenopathy, no carotid bruit, supple, symmetrical, trachea midline and thyroid not enlarged, symmetric, no tenderness/mass/nodules Back: symmetric, no curvature. ROM normal. No CVA tenderness. Lungs: clear to auscultation bilaterally Heart: regular rate and rhythm, S1, S2 normal, no murmur, click, rub or gallop Abdomen: soft, non-tender; bowel sounds normal; no masses,  no organomegaly Pulses: 2+ and symmetric Skin: Skin color, texture, turgor normal. No rashes  or lesions Lymph nodes: Cervical, supraclavicular, and axillary nodes normal.  Assessment and Plan:  Hip pain, right I sent her for x-rays of the right hip and right femur to rule out severe degenerative joint disease and stress fracture versus an impending stress fracture. She has severe osteoporosis with no recent treatment.  Posterior neck pain Symptoms suggestive of simple arthritis, but no signs of radiation or weakness in the ipsilateral arm. Suggested that she try using a pillow with cervical support and trial of ibuprofen and Tylenol.  Rectal bleeding Secondary to irritated hemorrhoids. She does not want hemorrhoid surgery at this point in her life. Bleeding is minimal. I have recommended she start using a fiber supplement on a daily basis to prevent constipation and passage of of dry stools. She scheduled to see Dr. Filbert Schilder on the seventh.   Updated Medication List Outpatient Encounter Prescriptions as of 02/11/2012  Medication Sig Dispense Refill  . amLODipine-benazepril (LOTREL) 5-20 MG per capsule Take 1 capsule by mouth 2 (two) times daily.  180 capsule  3  . aspirin 81 MG tablet Take 81 mg by mouth daily.        Marland Kitchen atenolol (TENORMIN) 25 MG tablet TAKE 1 TABLET TWICE A DAY  180 tablet  2  . atorvastatin (LIPITOR) 20 MG tablet Take 1 tablet (20 mg total) by mouth daily.  90 tablet  3  . brimonidine (ALPHAGAN P) 0.1 % SOLN Place 1 drop into both eyes 2 (two) times daily.        Marland Kitchen conjugated estrogens (PREMARIN) vaginal cream Place vaginally 2 (two) times a week. Place vaginally, 1 gm 2 times weekly  127.5 g  3  . esomeprazole (NEXIUM) 40 MG capsule Take 1 capsule (40 mg total) by mouth daily before breakfast.  90 capsule  3  . latanoprost (XALATAN) 0.005 % ophthalmic solution Place 1 drop into both eyes 2 (two) times daily.        Marland Kitchen levothyroxine (SYNTHROID) 50 MCG tablet Take 1 tablet (50 mcg total) by mouth daily.  90 tablet  3  . sucralfate (CARAFATE) 1 G tablet Take 1 g by  mouth 2 (two) times daily.        Marland Kitchen DISCONTD: Glucose Blood (BAYER BREEZE 2 TEST) DISK Patient test blood sugar daily.  100 each  3     Orders Placed This Encounter  Procedures  . DG Hip Complete Right  . DG Femur Right    No Follow-up on file.

## 2012-02-11 NOTE — Assessment & Plan Note (Signed)
I sent her for x-rays of the right hip and right femur to rule out severe degenerative joint disease and stress fracture versus an impending stress fracture. She has severe osteoporosis with no recent treatment.

## 2012-02-11 NOTE — Assessment & Plan Note (Signed)
Symptoms suggestive of simple arthritis, but no signs of radiation or weakness in the ipsilateral arm. Suggested that she try using a pillow with cervical support and trial of ibuprofen and Tylenol.

## 2012-02-11 NOTE — Assessment & Plan Note (Signed)
Secondary to irritated hemorrhoids. She does not want hemorrhoid surgery at this point in her life. Bleeding is minimal. I have recommended she start using a fiber supplement on a daily basis to prevent constipation and passage of of dry stools. She scheduled to see Dr. Filbert Schilder on the seventh.

## 2012-02-12 ENCOUNTER — Ambulatory Visit (INDEPENDENT_AMBULATORY_CARE_PROVIDER_SITE_OTHER)
Admission: RE | Admit: 2012-02-12 | Discharge: 2012-02-12 | Disposition: A | Discharge status: Home / Self Care | Payer: 59 | Source: Ambulatory Visit | Attending: Internal Medicine | Admitting: Internal Medicine

## 2012-02-12 DIAGNOSIS — M25551 Pain in right hip: Secondary | ICD-10-CM

## 2012-02-12 DIAGNOSIS — M79609 Pain in unspecified limb: Secondary | ICD-10-CM

## 2012-02-12 DIAGNOSIS — M25559 Pain in unspecified hip: Secondary | ICD-10-CM

## 2012-02-12 DIAGNOSIS — M79659 Pain in unspecified thigh: Secondary | ICD-10-CM

## 2012-02-14 ENCOUNTER — Other Ambulatory Visit: Payer: Self-pay | Admitting: Internal Medicine

## 2012-02-14 MED ORDER — SUCRALFATE 1 G PO TABS: 1.0000 g | ORAL_TABLET | Freq: Two times a day (BID) | ORAL | Status: DC

## 2012-02-21 ENCOUNTER — Other Ambulatory Visit: Payer: Self-pay | Admitting: Internal Medicine

## 2012-04-17 ENCOUNTER — Telehealth: Payer: Self-pay | Admitting: Internal Medicine

## 2012-04-17 DIAGNOSIS — Z1239 Encounter for other screening for malignant neoplasm of breast: Secondary | ICD-10-CM

## 2012-04-17 NOTE — Telephone Encounter (Signed)
Last mammo was 05/13/2011. Pt is needing referral for Mammo.

## 2012-04-22 NOTE — Telephone Encounter (Signed)
Order on printer

## 2012-04-24 ENCOUNTER — Other Ambulatory Visit: Payer: Self-pay | Admitting: Internal Medicine

## 2012-04-29 ENCOUNTER — Encounter: Payer: Self-pay | Admitting: Internal Medicine

## 2012-04-29 ENCOUNTER — Ambulatory Visit (INDEPENDENT_AMBULATORY_CARE_PROVIDER_SITE_OTHER): Payer: 59 | Admitting: Internal Medicine

## 2012-04-29 ENCOUNTER — Ambulatory Visit (INDEPENDENT_AMBULATORY_CARE_PROVIDER_SITE_OTHER)
Admission: RE | Admit: 2012-04-29 | Discharge: 2012-04-29 | Disposition: A | Discharge status: Home / Self Care | Payer: 59 | Source: Ambulatory Visit | Attending: Internal Medicine | Admitting: Internal Medicine

## 2012-04-29 VITALS — BP 124/62 | HR 87 | Temp 98.0°F | Ht 65.5 in | Wt 122.2 lb

## 2012-04-29 DIAGNOSIS — M25551 Pain in right hip: Secondary | ICD-10-CM

## 2012-04-29 DIAGNOSIS — M25559 Pain in unspecified hip: Secondary | ICD-10-CM

## 2012-04-29 DIAGNOSIS — M5430 Sciatica, unspecified side: Secondary | ICD-10-CM

## 2012-04-29 DIAGNOSIS — M5431 Sciatica, right side: Secondary | ICD-10-CM

## 2012-04-29 DIAGNOSIS — M810 Age-related osteoporosis without current pathological fracture: Secondary | ICD-10-CM

## 2012-04-29 NOTE — Patient Instructions (Addendum)
You can combine 2 ibuprofen and 1 tylenol   Every 8 hours if needed for pain   Plain lumbar spine films at [facility]  As soon as convenient.    Depending on the x rays,  We may need an MRI

## 2012-04-29 NOTE — Progress Notes (Signed)
Patient ID: Tiffany Grimes, female   DOB: [birth year], 78 y.o.   MRN: [id number]  Patient Active Problem List  Diagnosis  . Hypertension  . TIA (transient ischemic attack)  . Barrett esophagus  . Reflux  . Arrhythmia  . Chest pain, atypical  . Right otitis media  . Hx of thyroid nodule  . Other and unspecified hyperlipidemia  . Osteoporosis, post-menopausal  . Hip pain, right  . Posterior neck pain  . Rectal bleeding    Subjective:  CC:   Chief Complaint  Patient presents with  . Leg Pain    HPI:   Tiffany Grimes a 78 y.o. female who presents Persistent right sided hip pain which radiates to lateral and anterior thigh and occasionally anterior shin. The pain is getting worse.  She has  Had at least 4 episode of severe "lightening" style pain .  Aggravated by walking, prolonged standing and some positions of stooping.  Has trouble straightening leg out when she first lies down.  No numbness or tingling of foot,  No loss of bladder control. No prior trauma and no history of falls. Previous hip films normal.    Past Medical History  Diagnosis Date  . Thyroid disease   . TIA (transient ischemic attack) June 2012  . TIA (transient ischemic attack) June 2012  . Barrett esophagus     due for EGD July 2012, [place]  . Reflux   . Hx of thyroid nodule July 2012    benign biopsy     Past Surgical History  Procedure Date  . Thyroid surgery june 18    nodule removed  . Hernia repair 2008    left inguinal         The following portions of the patient's history were reviewed and updated as appropriate: Allergies, current medications, and problem list.    Review of Systems:   12 Pt  review of systems was negative except those addressed in the HPI,     History   Social History  . Marital Status: Married    Spouse Name: N/A    Number of Children: N/A  . Years of Education: N/A   Occupational History  . Not on file.   Social History Main Topics  . Smoking  status: Never Smoker   . Smokeless tobacco: Never Used  . Alcohol Use: No  . Drug Use: No  . Sexually Active: Not on file   Other Topics Concern  . Not on file   Social History Narrative  . No narrative on file    Objective:  BP 124/62  Pulse 87  Temp 98 F (36.7 C) (Oral)  Ht 5' 5.5" (1.664 m)  Wt 122 lb 4 oz (55.452 kg)  BMI 20.03 kg/m2  SpO2 98%  General appearance: alert, cooperative and appears stated age Ears: normal TM's and external ear canals both ears Throat: lips, mucosa, and tongue normal; teeth and gums normal Neck: no adenopathy, no carotid bruit, supple, symmetrical, trachea midline and thyroid not enlarged, symmetric, no tenderness/mass/nodules Back: symmetric, no curvature. ROM normal. No CVA tenderness. Lungs: clear to auscultation bilaterally Heart: regular rate and rhythm, S1, S2 normal, no murmur, click, rub or gallop Abdomen: soft, non-tender; bowel sounds normal; no masses,  no organomegaly Pulses: 2+ and symmetric Skin: Skin color, texture, turgor normal. No rashes or lesions Lymph nodes: Cervical, supraclavicular, and axillary nodes normal. MSK: normal hip ROM normal strength .,  DTRS  Assessment and Plan:  Hip pain, right  Persistent, suggestive now of sciatica. Exam is normal.   Plain films showed mild degenerative changes more on the right. PT recommended, prior to MRI  Osteoporosis, post-menopausal Severe, with no history of fractures.  Discussed and recommended Prolia.    Updated Medication List Outpatient Encounter Prescriptions as of 04/29/2012  Medication Sig Dispense Refill  . amLODipine-benazepril (LOTREL) 5-20 MG per capsule Take 1 capsule by mouth 2 (two) times daily.  180 capsule  3  . aspirin 81 MG tablet Take 81 mg by mouth daily.        Marland Kitchen atenolol (TENORMIN) 25 MG tablet TAKE 1 TABLET TWICE A DAY  180 tablet  1  . atorvastatin (LIPITOR) 20 MG tablet Take 1 tablet (20 mg total) by mouth daily.  90 tablet  3  . brimonidine  (ALPHAGAN P) 0.1 % SOLN Place 1 drop into both eyes 2 (two) times daily.        Marland Kitchen conjugated estrogens (PREMARIN) vaginal cream Place vaginally 2 (two) times a week. Place vaginally, 1 gm 2 times weekly  127.5 g  3  . esomeprazole (NEXIUM) 40 MG capsule Take 1 capsule (40 mg total) by mouth daily before breakfast.  90 capsule  3  . latanoprost (XALATAN) 0.005 % ophthalmic solution Place 1 drop into both eyes 2 (two) times daily.        Marland Kitchen levothyroxine (SYNTHROID, LEVOTHROID) 50 MCG tablet TAKE 1 TABLET DAILY  90 tablet  2  . sucralfate (CARAFATE) 1 G tablet Take 1 tablet (1 g total) by mouth 2 (two) times daily.  180 tablet  3     Orders Placed This Encounter  Procedures  . DG Lumbar Spine Complete    No Follow-up on file.

## 2012-04-30 ENCOUNTER — Encounter: Payer: Self-pay | Admitting: Internal Medicine

## 2012-04-30 NOTE — Assessment & Plan Note (Signed)
Severe, with no history of fractures.  Discussed and recommended Prolia.    

## 2012-04-30 NOTE — Assessment & Plan Note (Signed)
Persistent, suggestive now of sciatica. Exam is normal.   Plain films showed mild degenerative changes more on the right. PT recommended, prior to MRI

## 2012-05-01 ENCOUNTER — Telehealth: Payer: Self-pay | Admitting: Internal Medicine

## 2012-05-01 NOTE — Telephone Encounter (Signed)
Received fax from [facility] with the benefits for patient and Prolia. Her insurance company will pay all but 20% which is roughly $[phone]. I tried to call patient to set up an appointment and had to leave a message.

## 2012-05-06 ENCOUNTER — Telehealth: Payer: Self-pay | Admitting: Internal Medicine

## 2012-05-06 NOTE — Telephone Encounter (Signed)
Pt wants to schedule her physical therapy Please call pt with appointment pt would like [place] area

## 2012-06-04 ENCOUNTER — Telehealth: Payer: Self-pay | Admitting: Internal Medicine

## 2012-06-04 DIAGNOSIS — M5431 Sciatica, right side: Secondary | ICD-10-CM

## 2012-06-04 NOTE — Telephone Encounter (Signed)
Pt left message she decided that she wants to have physical therapy Please call her with appointment

## 2012-06-05 NOTE — Telephone Encounter (Signed)
Patient informed. 

## 2012-06-05 NOTE — Telephone Encounter (Signed)
Pt ordered for [facility] location.

## 2012-06-15 ENCOUNTER — Telehealth: Payer: Self-pay | Admitting: Internal Medicine

## 2012-06-15 ENCOUNTER — Ambulatory Visit (INDEPENDENT_AMBULATORY_CARE_PROVIDER_SITE_OTHER): Payer: 59 | Admitting: Internal Medicine

## 2012-06-15 ENCOUNTER — Encounter: Payer: Self-pay | Admitting: Internal Medicine

## 2012-06-15 VITALS — BP 140/76 | HR 77 | Temp 97.6°F | Resp 12 | Ht 66.0 in | Wt 122.8 lb

## 2012-06-15 DIAGNOSIS — R413 Other amnesia: Secondary | ICD-10-CM

## 2012-06-15 DIAGNOSIS — Z79899 Other long term (current) drug therapy: Secondary | ICD-10-CM

## 2012-06-15 DIAGNOSIS — M5430 Sciatica, unspecified side: Secondary | ICD-10-CM

## 2012-06-15 DIAGNOSIS — Z0000 Encounter for general adult medical examination without abnormal findings: Secondary | ICD-10-CM | POA: Insufficient documentation

## 2012-06-15 DIAGNOSIS — R4182 Altered mental status, unspecified: Secondary | ICD-10-CM

## 2012-06-15 DIAGNOSIS — F0390 Unspecified dementia without behavioral disturbance: Secondary | ICD-10-CM | POA: Insufficient documentation

## 2012-06-15 DIAGNOSIS — E785 Hyperlipidemia, unspecified: Secondary | ICD-10-CM

## 2012-06-15 DIAGNOSIS — M5441 Lumbago with sciatica, right side: Secondary | ICD-10-CM

## 2012-06-15 DIAGNOSIS — I10 Essential (primary) hypertension: Secondary | ICD-10-CM

## 2012-06-15 DIAGNOSIS — F0392 Unspecified dementia, unspecified severity, with psychotic disturbance: Secondary | ICD-10-CM | POA: Insufficient documentation

## 2012-06-15 DIAGNOSIS — Z23 Encounter for immunization: Secondary | ICD-10-CM

## 2012-06-15 DIAGNOSIS — E039 Hypothyroidism, unspecified: Secondary | ICD-10-CM

## 2012-06-15 DIAGNOSIS — F22 Delusional disorders: Secondary | ICD-10-CM

## 2012-06-15 LAB — COMPREHENSIVE METABOLIC PANEL
AST: 23 U/L (ref 0–37)
Albumin: 4.2 g/dL (ref 3.5–5.2)
BUN: 15 mg/dL (ref 6–23)
Calcium: 9.1 mg/dL (ref 8.4–10.5)
Chloride: 100 mEq/L (ref 96–112)
Glucose, Bld: 107 mg/dL — ABNORMAL HIGH (ref 70–99)
Potassium: 3.9 mEq/L (ref 3.5–5.1)

## 2012-06-15 LAB — LIPID PANEL
Cholesterol: 160 mg/dL (ref 0–200)
LDL Cholesterol: 84 mg/dL (ref 0–99)

## 2012-06-15 NOTE — Assessment & Plan Note (Signed)
She has reduced her dose of statin by 50% due to concerns about side effects of statins. I reassured her that as long as she sitting regular liver function monitoring by me there is manageable risk with this medication. Fasting lipids will be done today.

## 2012-06-15 NOTE — Assessment & Plan Note (Signed)
Suspected, after discussing patient's bizarre behavior with her husband. He will return with her next week for 30 minute visit which time we will assess her mental attitude more closely and confront her with the observations that her husband is made. She will need a MRI of the brain., TSH B12 and RPR

## 2012-06-15 NOTE — Assessment & Plan Note (Signed)
She has degenerative changes and mild disc herniation suggested by lumbar spine films and recently. She is requesting PT referral and this has been done. She is having 4 episodes of back pain only one of which resulted with sciatica symptoms. Currently her symptoms are not radiculopathic.

## 2012-06-15 NOTE — Telephone Encounter (Signed)
I do not know anything about this nor have I talked to Mr. Tarte.
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
## 2012-06-15 NOTE — Assessment & Plan Note (Signed)
well-controlled on current medications. Renal function to be done today.

## 2012-06-15 NOTE — Assessment & Plan Note (Signed)
Breast and pelvic were done today

## 2012-06-15 NOTE — Patient Instructions (Addendum)
You received the pneumonia vaccine today  I will call you with your lab results.   I do recommend the TDaP vaccine.  Medicare will not pay for it but recommends it to all persons,  It is available at local pharmacies for around $65.00  Please return in 6 months for labs and OV

## 2012-06-17 ENCOUNTER — Encounter: Payer: Self-pay | Admitting: Internal Medicine

## 2012-06-17 ENCOUNTER — Ambulatory Visit: Payer: 59 | Admitting: Internal Medicine

## 2012-06-17 ENCOUNTER — Ambulatory Visit (INDEPENDENT_AMBULATORY_CARE_PROVIDER_SITE_OTHER): Payer: 59 | Admitting: Internal Medicine

## 2012-06-17 VITALS — BP 154/70 | HR 114 | Temp 97.8°F | Ht 66.0 in | Wt 122.8 lb

## 2012-06-17 DIAGNOSIS — R4182 Altered mental status, unspecified: Secondary | ICD-10-CM

## 2012-06-17 NOTE — Addendum Note (Signed)
Addended by: Montine Circle D on: 06/17/2012 03:16 PM   Modules accepted: Orders
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
## 2012-06-17 NOTE — Patient Instructions (Addendum)
I am ordering an MRI of your brain to make sure there is nothing affecting your ability to process information  If you change your mind about accepting medication to help manage your stress, let me know.    We will call you with the appt for the MRI.  I am also checking a few other labs to evaluate your memory.

## 2012-06-19 ENCOUNTER — Telehealth: Payer: Self-pay | Admitting: Internal Medicine

## 2012-06-19 DIAGNOSIS — R4182 Altered mental status, unspecified: Secondary | ICD-10-CM | POA: Insufficient documentation

## 2012-06-19 NOTE — Telephone Encounter (Signed)
Pt called and agrees to have MRI to keep peace in her family. She wanted me to let you know.
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
## 2012-06-23 NOTE — Telephone Encounter (Signed)
Tried calling patient however line was busy. She has an appointment for Dec. 9, 2013 at 12:45 she needs to arrive at the [facility] at [facility] at 12:15.

## 2012-06-23 NOTE — Telephone Encounter (Signed)
Patient is aware of the appointment. 

## 2012-06-25 ENCOUNTER — Telehealth: Payer: Self-pay | Admitting: Internal Medicine

## 2012-06-25 NOTE — Telephone Encounter (Signed)
Tiffany Grimes called stating she was going to call and cancel her mri  Appointment on 12/9 @ [facility]

## 2012-07-07 ENCOUNTER — Ambulatory Visit: Payer: Self-pay | Admitting: Internal Medicine

## 2012-07-10 ENCOUNTER — Ambulatory Visit: Payer: Self-pay | Admitting: Gastroenterology

## 2012-07-14 LAB — PATHOLOGY REPORT

## 2012-07-21 ENCOUNTER — Encounter: Payer: Self-pay | Admitting: Internal Medicine

## 2012-07-21 ENCOUNTER — Ambulatory Visit (INDEPENDENT_AMBULATORY_CARE_PROVIDER_SITE_OTHER): Payer: 59 | Admitting: Internal Medicine

## 2012-07-21 VITALS — BP 134/74 | HR 61 | Temp 97.5°F | Resp 12 | Ht 65.0 in | Wt 121.0 lb

## 2012-07-21 DIAGNOSIS — R51 Headache: Secondary | ICD-10-CM

## 2012-07-21 DIAGNOSIS — F0390 Unspecified dementia without behavioral disturbance: Secondary | ICD-10-CM

## 2012-07-21 MED ORDER — LEVOFLOXACIN 500 MG PO TABS: 500.0000 mg | ORAL_TABLET | Freq: Every day | ORAL | Status: DC

## 2012-07-21 MED ORDER — PREDNISONE (PAK) 10 MG PO TABS: ORAL_TABLET | ORAL | Status: DC
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
## 2012-07-21 NOTE — Patient Instructions (Addendum)
I am prescribing a prednisone taper for your sinus congestion and neck pain  Please also flush your sinuses twice daily with simply saline   If you develop green, bloody or brown nasal discharge,  Start the antibiotic.

## 2012-07-23 ENCOUNTER — Encounter: Payer: Self-pay | Admitting: Internal Medicine

## 2012-07-23 NOTE — Assessment & Plan Note (Signed)
HEENT exam is normal.  Trial of prednisone taper and Flonase nasal spray.  No indication for antibiotics.  If pain persists, will suggest the MRI again given the behavioral issue that is still unsolved.
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
## 2012-07-28 ENCOUNTER — Telehealth: Payer: Self-pay | Admitting: Internal Medicine

## 2012-07-28 DIAGNOSIS — Z630 Problems in relationship with spouse or partner: Secondary | ICD-10-CM

## 2012-07-28 NOTE — Telephone Encounter (Signed)
Pt came by and is wondering about a therapist they should go to. She said you recommended a name but she couldn't remember the name. She says she was going to try it out and see if she could get her husband to go.

## 2012-07-29 NOTE — Telephone Encounter (Signed)
Tiffany Grimes,  Will put referral in epic

## 2012-07-29 NOTE — Telephone Encounter (Signed)
Pt.notified

## 2012-07-30 ENCOUNTER — Encounter: Payer: Self-pay | Admitting: Internal Medicine

## 2012-07-30 ENCOUNTER — Telehealth: Payer: Self-pay | Admitting: Internal Medicine

## 2012-07-30 NOTE — Telephone Encounter (Signed)
FYI

## 2012-07-30 NOTE — Telephone Encounter (Signed)
Cindy from [facility] called to inform us that the patient did not come for her MRI appointment . The patient called Arline Asp to inform her that she could not reschedule her MRI per this office. Arline Asp called me this morning and I informed her that the patient had not spoken to any one in this office about her MRI being rescheduled . Arline Asp came to the conclusion that it was an old message that she had left the patient. She will inform the patient to call this office to reschedule her MRI.

## 2012-09-02 ENCOUNTER — Other Ambulatory Visit: Payer: Self-pay | Admitting: Internal Medicine

## 2012-09-16 ENCOUNTER — Ambulatory Visit: Payer: 59 | Admitting: Psychology

## 2012-10-06 ENCOUNTER — Ambulatory Visit (INDEPENDENT_AMBULATORY_CARE_PROVIDER_SITE_OTHER): Payer: 59 | Admitting: Psychology

## 2012-10-06 DIAGNOSIS — F4323 Adjustment disorder with mixed anxiety and depressed mood: Secondary | ICD-10-CM

## 2012-10-15 ENCOUNTER — Emergency Department: Payer: Self-pay | Admitting: Emergency Medicine

## 2012-10-15 ENCOUNTER — Telehealth: Payer: Self-pay | Admitting: Internal Medicine

## 2012-10-15 LAB — CBC
HCT: 38.7 % (ref 35.0–47.0)
HGB: 13.1 g/dL (ref 12.0–16.0)
MCH: 29.4 pg (ref 26.0–34.0)
MCHC: 33.7 g/dL (ref 32.0–36.0)
MCV: 87 fL (ref 80–100)
Platelet: 207 10*3/uL (ref 150–440)
RBC: 4.44 10*6/uL (ref 3.80–5.20)
RDW: 13.4 % (ref 11.5–14.5)
WBC: 4.1 10*3/uL (ref 3.6–11.0)

## 2012-10-15 LAB — BASIC METABOLIC PANEL
Anion Gap: 6 — ABNORMAL LOW (ref 7–16)
Chloride: 105 mmol/L (ref 98–107)
Co2: 28 mmol/L (ref 21–32)
Creatinine: 0.88 mg/dL (ref 0.60–1.30)
Glucose: 116 mg/dL — ABNORMAL HIGH (ref 65–99)
Sodium: 139 mmol/L (ref 136–145)

## 2012-10-15 LAB — URINALYSIS, COMPLETE
Bilirubin,UR: NEGATIVE
Glucose,UR: NEGATIVE mg/dL (ref 0–75)
Ketone: NEGATIVE
Leukocyte Esterase: NEGATIVE
Nitrite: NEGATIVE
Ph: 6 (ref 4.5–8.0)
Squamous Epithelial: 2
WBC UR: 4 /HPF (ref 0–5)

## 2012-10-15 NOTE — Telephone Encounter (Addendum)
Patient has a follow up appointment set for 4.9.14 from ER visit on 3.27.14.

## 2012-10-15 NOTE — Telephone Encounter (Signed)
Pt was called back stated she had been in the hospital due to a syncopal episode today. They ran some tests on pt and stated that they all were normal. Pt states she feels weak and has been laying down all day today.

## 2012-10-20 ENCOUNTER — Ambulatory Visit (INDEPENDENT_AMBULATORY_CARE_PROVIDER_SITE_OTHER): Payer: 59 | Admitting: Psychology

## 2012-10-20 DIAGNOSIS — F4323 Adjustment disorder with mixed anxiety and depressed mood: Secondary | ICD-10-CM

## 2012-10-22 ENCOUNTER — Telehealth: Payer: Self-pay | Admitting: Internal Medicine

## 2012-10-22 MED ORDER — ESCITALOPRAM OXALATE 10 MG PO TABS: 10.0000 mg | ORAL_TABLET | Freq: Every day | ORAL | Status: DC

## 2012-10-22 NOTE — Telephone Encounter (Signed)
Patient wanting medication for anxiety called into [facility] [address].

## 2012-10-22 NOTE — Telephone Encounter (Signed)
Pt.notified

## 2012-10-22 NOTE — Telephone Encounter (Signed)
Pt states that she has been seeing the counselor recommended by Dr. Darrick Huntsman. The counselor and pt's family agreed for pt to try medication to help calm her down due to issues in the home.

## 2012-10-22 NOTE — Telephone Encounter (Signed)
Ok. Will call in lexapro.  It is NOT  A sedative but helps people manage anxiety and stress ,  Over time.  Takes about 2 weeks before she will notice an improvement.  Ver low side effect profile and very effective.  Start with 1/2 tablet daily with a meal for the first 4 days,  Then full tablet daily.  Follow up with me in a month.

## 2012-10-25 ENCOUNTER — Other Ambulatory Visit: Payer: Self-pay | Admitting: Internal Medicine

## 2012-10-28 ENCOUNTER — Encounter: Payer: Self-pay | Admitting: Internal Medicine

## 2012-10-28 ENCOUNTER — Ambulatory Visit (INDEPENDENT_AMBULATORY_CARE_PROVIDER_SITE_OTHER): Payer: 59 | Admitting: Internal Medicine

## 2012-10-28 VITALS — BP 104/60 | HR 102 | Temp 97.8°F | Resp 16 | Wt 124.8 lb

## 2012-10-28 DIAGNOSIS — F411 Generalized anxiety disorder: Secondary | ICD-10-CM

## 2012-10-28 DIAGNOSIS — I4891 Unspecified atrial fibrillation: Secondary | ICD-10-CM

## 2012-10-28 DIAGNOSIS — I499 Cardiac arrhythmia, unspecified: Secondary | ICD-10-CM

## 2012-10-28 MED ORDER — ATENOLOL 50 MG PO TABS: ORAL_TABLET | ORAL | Status: DC

## 2012-10-28 NOTE — Patient Instructions (Addendum)
You heart rate is irregular and fast (atrial fibrillation).  This is why you fainted .  I am increasing your atenolol to 50 mg twice daily and stopping your lotrel so we can control your heart rate better and not drop your blood pressure too much  I am referring you to [facility] for a cardiology evaluation .

## 2012-10-28 NOTE — Assessment & Plan Note (Signed)
She has entered into counselling at my advice due to persistent suspicions centering around her husband and his presumed infidelity .  She has grudgingly agreed to therapy and I have prescribed lexapro at low dose.  Discussion of the medication , its MOA and the expected effect.  Recommended a trial of at least a month before deciding whether it is effective.

## 2012-10-28 NOTE — Progress Notes (Signed)
Patient ID: Tiffany Grimes, female   DOB: 1929-04-18, 79 y.o.   MRN: [id number]  Patient Active Problem List  Diagnosis  . Hypertension  . TIA (transient ischemic attack)  . Barrett esophagus  . Reflux  . Atrial fibrillation, new onset  . Chest pain, atypical  . Right otitis media  . Hx of thyroid nodule  . Other and unspecified hyperlipidemia  . Osteoporosis, post-menopausal  . Lumbago with sciatica of right side  . Posterior neck pain  . Rectal bleeding  . Routine general medical examination at a health care facility  . Presenile dementia with paranoia  . Altered mental status  . Sinus headache    Subjective:  CC:   Chief Complaint  Patient presents with  . Follow-up    Hospital    HPI:   Tiffany Grimes a 79 y.o. female who presents for follow up on syncope.  Patient had an unwitnessed syncopal episode on March 27 while combing her hair.  Episode occurred in the bathroom. She had not recently voided and had not had any Gi illness,  Headache or chest pain.  She reports having had some warning and was able to sit down on the closed toilet seat and brace herself before losing consciousness.  When she woke up she called 911 .  EMT records not available..  In ED BP was 156/77,  No orthostatics done. Lytes normal, Troponin negative,  CT head showed atrophy, no change compared to prior,  EKG showed  pac's.    Has not had any syncopal episodes since release.  In office today her pulse is irregular and rapid (rate low 100's) .  She has noticed an irregular pulse  before at home and recently  Attributes it to "stress"  due to ongoing conflict with husband (See prior notes).  She recently started Lexapro at 5 mg daily for management of anxiety.     Past Medical History  Diagnosis Date  . Thyroid disease   . TIA (transient ischemic attack) June 2012  . TIA (transient ischemic attack) June 2012  . Barrett esophagus     due for EGD July 2012, [place]  . Reflux   . Hx of thyroid  nodule July 2012    benign biopsy     Past Surgical History  Procedure Laterality Date  . Thyroid surgery  june 18    nodule removed  . Hernia repair  2008    left inguinal     The following portions of the patient's history were reviewed and updated as appropriate: Allergies, current medications, and problem list.    Review of Systems:  Patient denies headache, fevers, malaise, unintentional weight loss, skin rash, eye pain, sinus congestion and sinus pain, sore throat, dysphagia,  hemoptysis , cough, dyspnea, wheezing, chest pain, palpitations, orthopnea, edema, abdominal pain, nausea, melena, diarrhea, constipation, flank pain, dysuria, hematuria, urinary  Frequency, nocturia, numbness, tingling, seizures,  Focal weakness, Loss of consciousness,  Tremor, insomnia, depression, anxiety, and suicidal ideation.     History   Social History  . Marital Status: Married    Spouse Name: N/A    Number of Children: N/A  . Years of Education: N/A   Occupational History  . Not on file.   Social History Main Topics  . Smoking status: Never Smoker   . Smokeless tobacco: Never Used  . Alcohol Use: No  . Drug Use: No  . Sexually Active: Not on file   Other Topics Concern  . Not  on file   Social History Narrative  . No narrative on file    Objective:  BP 104/60  Pulse 102  Temp(Src) 97.8 F (36.6 C) (Oral)  Resp 16  Wt 124 lb 12 oz (56.586 kg)  BMI 20.76 kg/m2  SpO2 96%  General appearance: calm, alert, cooperative and appears stated age Ears: normal TM's and external ear canals both ears Throat: lips, mucosa, and tongue normal; teeth and gums normal Neck: no adenopathy, no carotid bruit, supple, symmetrical, trachea midline and thyroid not enlarged, symmetric, no tenderness/mass/nodules Back: symmetric, no curvature. ROM normal. No CVA tenderness. Lungs: clear to auscultation bilaterally Heart: irregularly irregular, tachycardic , S1 and  S2 normal, no murmur, click,  rub or gallop Abdomen: soft, non-tender; bowel sounds normal; no masses,  no organomegaly Pulses: 2+ and symmetric Skin: Skin color, texture, turgor normal. No rashes or lesions Lymph nodes: Cervical, supraclavicular, and axillary nodes normal. Psych:  Flat affect, answers appropriately  Assessment and Plan:  Atrial fibrillation, new onset She has had prior episodes which sounded like a sinus pause which i was unable to capture on EKG and she had deferred cardiology evaluation but agreed to have Holter monitor or Cardionet if symptoms recurred. Today her rhythm appears to be atrial fibrillation; therefore  I am referring to [facility] for evaluaiton with ECHO.  Will need anticoagulation as her CHADS2 score is over 2.  I am increasing her atenolol to 50 mg bid for rate control and have suspended her lotrel to prevent hypotension   Anxiety state, unspecified She has entered into counselling at my advice due to persistent suspicions centering around her husband and his presumed infidelity .  She has grudgingly agreed to therapy and I have prescribed lexapro at low dose.  Discussion of the medication , its MOA and the expected effect.  Recommended a trial of at least a month before deciding whether it is effective.    A total of 40 minutes was spent with patient more than half of which was spent in counseling, reviewing records from other prviders and coordination of care.  Updated Medication List Outpatient Encounter Prescriptions as of 10/28/2012  Medication Sig Dispense Refill  . aspirin 81 MG tablet Take 81 mg by mouth daily.        Marland Kitchen atenolol (TENORMIN) 50 MG tablet TAKE 1 TABLET TWICE A DAY  180 tablet  1  . atorvastatin (LIPITOR) 20 MG tablet Take 1 tablet (20 mg total) by mouth daily.  90 tablet  3  . brimonidine (ALPHAGAN P) 0.1 % SOLN Place 1 drop into both eyes 2 (two) times daily.        Marland Kitchen latanoprost (XALATAN) 0.005 % ophthalmic solution Place 1 drop into both eyes 2 (two) times  daily.        Marland Kitchen levothyroxine (SYNTHROID, LEVOTHROID) 50 MCG tablet TAKE 1 TABLET DAILY  90 tablet  1  . NEXIUM 40 MG capsule TAKE 1 CAPSULE DAILY BEFORE BREAKFAST  90 capsule  1  . sucralfate (CARAFATE) 1 G tablet Take 1 tablet (1 g total) by mouth 2 (two) times daily.  180 tablet  3  . [DISCONTINUED] amLODipine-benazepril (LOTREL) 5-20 MG per capsule Take 1 capsule by mouth 2 (two) times daily.  180 capsule  3  . [DISCONTINUED] atenolol (TENORMIN) 25 MG tablet TAKE 1 TABLET TWICE A DAY  180 tablet  1  . escitalopram (LEXAPRO) 10 MG tablet Take 1 tablet (10 mg total) by mouth daily.  30 tablet  1  . levofloxacin (LEVAQUIN) 500 MG tablet Take 1 tablet (500 mg total) by mouth daily.  7 tablet  0  . predniSONE (STERAPRED UNI-PAK) 10 MG tablet 6 tablets on Day 1 , then reduce by 1 tablet daily until gone  21 tablet  0   No facility-administered encounter medications on file as of 10/28/2012.

## 2012-10-28 NOTE — Assessment & Plan Note (Addendum)
She has had prior episodes which sounded like a sinus pause which i was unable to capture on EKG and she had deferred cardiology evaluation but agreed to have Holter monitor or Cardionet if symptoms recurred. Today her rhythm appears to be atrial fibrillation; therefore  I am referring to [facility] for evaluaiton with ECHO.  Will need anticoagulation as her CHADS2 score is over 2.  I am increasing her atenolol to 50 mg bid for rate control and have suspended her lotrel to prevent hypotension

## 2012-11-02 ENCOUNTER — Telehealth: Payer: Self-pay | Admitting: *Deleted

## 2012-11-02 NOTE — Telephone Encounter (Signed)
Patient called to verify dosage Atenolol 50 MG ! Twice aday verified as on MAR.

## 2012-11-02 NOTE — Telephone Encounter (Signed)
Yes twice daily 50 mg  ,  Until seen by cardiology

## 2012-11-26 ENCOUNTER — Encounter: Payer: Self-pay | Admitting: Internal Medicine

## 2012-12-28 ENCOUNTER — Other Ambulatory Visit: Payer: Self-pay | Admitting: Internal Medicine

## 2013-01-20 ENCOUNTER — Observation Stay: Payer: Self-pay | Admitting: Internal Medicine

## 2013-01-20 LAB — CBC WITH DIFFERENTIAL/PLATELET
Basophil %: 0.7 %
Eosinophil %: 0.6 %
HCT: 42.6 % (ref 35.0–47.0)
HGB: 14.1 g/dL (ref 12.0–16.0)
Lymphocyte #: 0.9 10*3/uL — ABNORMAL LOW (ref 1.0–3.6)
Lymphocyte %: 13.1 %
MCH: 27.8 pg (ref 26.0–34.0)
MCHC: 33 g/dL (ref 32.0–36.0)
MCV: 84 fL (ref 80–100)
Monocyte #: 0.5 x10 3/mm (ref 0.2–0.9)
Platelet: 208 10*3/uL (ref 150–440)
RBC: 5.06 10*6/uL (ref 3.80–5.20)

## 2013-01-20 LAB — COMPREHENSIVE METABOLIC PANEL
Albumin: 3.9 g/dL (ref 3.4–5.0)
Alkaline Phosphatase: 82 U/L (ref 50–136)
Bilirubin,Total: 0.5 mg/dL (ref 0.2–1.0)
Calcium, Total: 9.3 mg/dL (ref 8.5–10.1)
Chloride: 106 mmol/L (ref 98–107)
EGFR (African American): >60
EGFR (Non-African Amer.): 58 — ABNORMAL LOW
Osmolality: 280 (ref 275–301)
Potassium: 4 mmol/L (ref 3.5–5.1)
SGOT(AST): 27 U/L (ref 15–37)
Sodium: 139 mmol/L (ref 136–145)
Total Protein: 6.8 g/dL (ref 6.4–8.2)

## 2013-01-20 LAB — TROPONIN I
Troponin-I: <0.02 ng/mL
Troponin-I: <0.02 ng/mL

## 2013-01-20 LAB — URINALYSIS, COMPLETE
Bacteria: NONE SEEN
Bilirubin,UR: NEGATIVE
Glucose,UR: NEGATIVE mg/dL (ref 0–75)
Ketone: NEGATIVE
Ph: 7 (ref 4.5–8.0)
RBC,UR: 12 /HPF (ref 0–5)
Specific Gravity: 1.014 (ref 1.003–1.030)
Squamous Epithelial: 1

## 2013-01-20 LAB — CK TOTAL AND CKMB (NOT AT ARMC)
CK, Total: 275 U/L — ABNORMAL HIGH (ref 21–215)
CK, Total: 220 U/L — ABNORMAL HIGH (ref 21–215)

## 2013-01-20 LAB — LIPASE, BLOOD: Lipase: 88 U/L (ref 73–393)

## 2013-01-20 LAB — TSH: Thyroid Stimulating Horm: 1.94 u[IU]/mL

## 2013-01-21 ENCOUNTER — Telehealth: Payer: Self-pay | Admitting: Internal Medicine

## 2013-01-21 LAB — LIPID PANEL
Cholesterol: 139 mg/dL (ref 0–200)
HDL Cholesterol: 60 mg/dL (ref 40–60)
Ldl Cholesterol, Calc: 64 mg/dL (ref 0–100)

## 2013-01-21 LAB — CK TOTAL AND CKMB (NOT AT ARMC)
CK, Total: 186 U/L (ref 21–215)
CK, Total: 187 U/L (ref 21–215)

## 2013-01-21 LAB — TROPONIN I: Troponin-I: <0.02 ng/mL

## 2013-01-21 NOTE — Telephone Encounter (Signed)
Hospital follow for 7/10 pt discharged 7/3

## 2013-01-25 NOTE — Telephone Encounter (Signed)
Faxed for discharge summary sent, FYI

## 2013-01-28 ENCOUNTER — Encounter: Payer: Self-pay | Admitting: Internal Medicine

## 2013-01-28 ENCOUNTER — Ambulatory Visit (INDEPENDENT_AMBULATORY_CARE_PROVIDER_SITE_OTHER): Payer: 59 | Admitting: Internal Medicine

## 2013-01-28 VITALS — BP 140/84 | HR 70 | Temp 97.4°F | Resp 14 | Wt 118.8 lb

## 2013-01-28 DIAGNOSIS — R0789 Other chest pain: Secondary | ICD-10-CM

## 2013-01-28 DIAGNOSIS — I10 Essential (primary) hypertension: Secondary | ICD-10-CM

## 2013-01-28 DIAGNOSIS — K2270 Barrett's esophagus without dysplasia: Secondary | ICD-10-CM

## 2013-01-28 DIAGNOSIS — I4891 Unspecified atrial fibrillation: Secondary | ICD-10-CM

## 2013-01-28 DIAGNOSIS — Z79899 Other long term (current) drug therapy: Secondary | ICD-10-CM

## 2013-01-28 LAB — BASIC METABOLIC PANEL
BUN: 13 mg/dL (ref 6–23)
Calcium: 8.7 mg/dL (ref 8.4–10.5)
Creatinine, Ser: 1 mg/dL (ref 0.4–1.2)
GFR: 57.04 mL/min — ABNORMAL LOW (ref >60.00)
Glucose, Bld: 106 mg/dL — ABNORMAL HIGH (ref 70–99)
Potassium: 4.4 mEq/L (ref 3.5–5.1)

## 2013-01-28 NOTE — Patient Instructions (Addendum)
Your recent episode of abdominal pain may have been due to constipation  Constipation is best relieved by drinking ample amounts of water  ( 3 16 ounce servings daily) and increasing your dietary fiber (or taking a fiber laxative like metamucil, citrucel,  Or miralax)  You need 25 to 35 g fiber daily according to GI doctors   You can get this by eating All Bran combined with  Pumpkin Raisin Crunch ([employer]'s Path)   OR  Try using [employer] low carb whole wheat tortilla for bread replacement ( 26 g fiber)    Continue the lisinopril for now,  For blood pressure .  You need blood work today   The Xarelto is taken daily to prevent strokes   You will need it forever.

## 2013-01-31 ENCOUNTER — Encounter: Payer: Self-pay | Admitting: Internal Medicine

## 2013-01-31 NOTE — Assessment & Plan Note (Signed)
Recent admission ruled out ischemia, cholelithiasis and PE.  Workup reviewed with patient today  She is being treated for gastritis and constipation.

## 2013-01-31 NOTE — Assessment & Plan Note (Signed)
Managed with atenolol for rate control.  Xarelto for risk of stroke mitigation .  Use reviewed with patient.

## 2013-01-31 NOTE — Progress Notes (Signed)
Patient ID: Tiffany Grimes, female   DOB: 29-May-1929, 79 y.o.   MRN: [id number]  Patient Active Problem List   Diagnosis Date Noted  . Anxiety state, unspecified 10/28/2012  . Routine general medical examination at a health care facility 06/15/2012  . 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 Osteoporosis, post-menopausal 02/11/2012  . Lumbago with sciatica of right side 02/11/2012  . Posterior neck pain 02/11/2012  . Other and unspecified hyperlipidemia 12/16/2011  . Hx of thyroid nodule   . Atrial fibrillation, new onset 08/02/2011  . Chest pain, atypical 08/02/2011  . TIA (transient ischemic attack)   . Barrett esophagus   . Hypertension 03/29/2011    Subjective:  CC:   Chief Complaint  Patient presents with  . Follow-up    hospital follow up    HPI:   Tiffany Grimes a 79 y.o. female who presents for hospital follow up .  She was admitted to [facility] on July 2nd and discharged on July 3rd.  Her cc was chest pain.  She ruled out for AMI  With serial cardiac enzymes and a negative myoview stress test.  She underwent a CT angiogram of chest which was negative for PE, and an abdominal ultrasoudn negative for gallstones.  She has not had chest pain since discharge.  She has a history of Barrett's esophagus and recurrent constipation that may have been the cause of the "chest pain" that during hospital follow up visit today she describes as epigastric and accompanied by mild nausea.    Past Medical History  Diagnosis Date  . Thyroid disease   . TIA (transient ischemic attack) June 2012  . TIA (transient ischemic attack) June 2012  . Barrett esophagus     due for EGD July 2012, [place]  . Reflux   . Hx of thyroid nodule July 2012    benign biopsy     Past Surgical History  Procedure Laterality Date  . Thyroid surgery  june 18    nodule removed  . Hernia repair  2008    left inguinal       The following portions of the patient's history were reviewed and updated as  appropriate: Allergies, current medications, and problem list.    Review of Systems:   12 Pt  review of systems was negative except those addressed in the HPI,     History   Social History  . Marital Status: Married    Spouse Name: N/A    Number of Children: N/A  . Years of Education: N/A   Occupational History  . Not on file.   Social History Main Topics  . Smoking status: Never Smoker   . Smokeless tobacco: Never Used  . Alcohol Use: No  . Drug Use: No  . Sexually Active: Not on file   Other Topics Concern  . Not on file   Social History Narrative  . No narrative on file    Objective:  BP 140/84  Pulse 70  Temp(Src) 97.4 F (36.3 C) (Oral)  Resp 14  Wt 118 lb 12 oz (53.865 kg)  BMI 19.76 kg/m2  SpO2 98%  General appearance: alert, cooperative and appears stated age Ears: normal TM's and external ear canals both ears Throat: lips, mucosa, and tongue normal; teeth and gums normal Neck: no adenopathy, no carotid bruit, supple, symmetrical, trachea midline and thyroid not enlarged, symmetric, no tenderness/mass/nodules Back: symmetric, no curvature. ROM normal. No CVA tenderness. Lungs: clear to auscultation bilaterally Heart: regular rate and  rhythm, S1, S2 normal, no murmur, click, rub or gallop Abdomen: soft, non-tender; bowel sounds normal; no masses,  no organomegaly Pulses: 2+ and symmetric Skin: Skin color, texture, turgor normal. No rashes or lesions Lymph nodes: Cervical, supraclavicular, and axillary nodes normal.  Assessment and Plan:  Chest pain, atypical Recent admission ruled out ischemia, cholelithiasis and PE.  Workup reviewed with patient today  She is being treated for gastritis and constipation.  Atrial fibrillation, new onset Managed with atenolol for rate control.  Xarelto for risk of stroke mitigation .  Use reviewed with patient.   Barrett esophagus reminded to continue PPI   Hypertension Uncontrolled during admission and  lisinopril was added.  Well controlled on current regimen. Renal function stable, no changes today.   Updated Medication List Outpatient Encounter Prescriptions as of 01/28/2013  Medication Sig Dispense Refill  . aspirin 325 MG tablet Take 325 mg by mouth daily.      Marland Kitchen atenolol (TENORMIN) 50 MG tablet TAKE 1 TABLET TWICE A DAY  180 tablet  1  . atorvastatin (LIPITOR) 20 MG tablet TAKE 1 TABLET DAILY  90 tablet  2  . brimonidine (ALPHAGAN P) 0.1 % SOLN Place 1 drop into both eyes 2 (two) times daily.        Marland Kitchen latanoprost (XALATAN) 0.005 % ophthalmic solution Place 1 drop into both eyes 2 (two) times daily.        Marland Kitchen levothyroxine (SYNTHROID, LEVOTHROID) 50 MCG tablet TAKE 1 TABLET DAILY  90 tablet  1  . lisinopril (PRINIVIL,ZESTRIL) 5 MG tablet Take 5 mg by mouth daily.      Marland Kitchen NEXIUM 40 MG capsule TAKE 1 CAPSULE DAILY BEFORE BREAKFAST  90 capsule  1  . Rivaroxaban (XARELTO) 15 MG TABS tablet Take 15 mg by mouth daily.      . sucralfate (CARAFATE) 1 G tablet Take 1 tablet (1 g total) by mouth 2 (two) times daily.  180 tablet  3  . escitalopram (LEXAPRO) 10 MG tablet Take 1 tablet (10 mg total) by mouth daily.  30 tablet  1  . [DISCONTINUED] aspirin 81 MG tablet Take 81 mg by mouth daily.        . [DISCONTINUED] levofloxacin (LEVAQUIN) 500 MG tablet Take 1 tablet (500 mg total) by mouth daily.  7 tablet  0  . [DISCONTINUED] predniSONE (STERAPRED UNI-PAK) 10 MG tablet 6 tablets on Day 1 , then reduce by 1 tablet daily until gone  21 tablet  0   No facility-administered encounter medications on file as of 01/28/2013.     Orders Placed This Encounter  Procedures  . Basic metabolic panel    Return in about 3 months (around 04/30/2013).

## 2013-01-31 NOTE — Assessment & Plan Note (Addendum)
Uncontrolled during admission and lisinopril was added.  Well controlled on current regimen. Renal function stable, no changes today.

## 2013-01-31 NOTE — Assessment & Plan Note (Signed)
reminded to continue PPI

## 2013-02-01 ENCOUNTER — Encounter: Payer: Self-pay | Admitting: Internal Medicine

## 2013-02-07 ENCOUNTER — Telehealth: Payer: Self-pay | Admitting: Internal Medicine

## 2013-02-07 DIAGNOSIS — G459 Transient cerebral ischemic attack, unspecified: Secondary | ICD-10-CM

## 2013-02-07 NOTE — Telephone Encounter (Signed)
Continue aspirin and atorvastatin ,  Carotid ultrasound showed no  Progression of mild stenosis seen previosuly

## 2013-02-07 NOTE — Assessment & Plan Note (Addendum)
Carotid ultrasounds were unchanged,  No significant stenosis,  Repeat in 2 years.  Continue asa and statin

## 2013-02-09 NOTE — Telephone Encounter (Signed)
Advised patient as instructed.  She asks if this is in agreement with cardiologist, per Dr Darrick Huntsman advised yes.

## 2013-02-15 ENCOUNTER — Telehealth: Payer: Self-pay | Admitting: Internal Medicine

## 2013-02-15 MED ORDER — LISINOPRIL 5 MG PO TABS: 5.0000 mg | ORAL_TABLET | Freq: Every day | ORAL | Status: DC

## 2013-02-15 NOTE — Telephone Encounter (Signed)
Pt came in today she needs a refill on lisinopril 5mg  [facility] [address] Pt has 5 pill left Please advise when this is called in

## 2013-02-15 NOTE — Telephone Encounter (Signed)
Refill sent patient notified  

## 2013-03-13 ENCOUNTER — Other Ambulatory Visit: Payer: Self-pay | Admitting: Internal Medicine

## 2013-03-29 ENCOUNTER — Other Ambulatory Visit: Payer: Self-pay | Admitting: Internal Medicine

## 2013-04-08 ENCOUNTER — Other Ambulatory Visit: Payer: Self-pay | Admitting: Internal Medicine

## 2013-04-08 ENCOUNTER — Telehealth: Payer: Self-pay | Admitting: Internal Medicine

## 2013-04-08 DIAGNOSIS — I499 Cardiac arrhythmia, unspecified: Secondary | ICD-10-CM

## 2013-04-08 MED ORDER — ATENOLOL 50 MG PO TABS: ORAL_TABLET | ORAL | Status: DC

## 2013-04-08 NOTE — Telephone Encounter (Signed)
Pt needing 10 day refill on Atenolol Tab 25 mg.

## 2013-04-08 NOTE — Telephone Encounter (Signed)
Refill sent.

## 2013-04-08 NOTE — Telephone Encounter (Signed)
Eprescribed.

## 2013-05-04 ENCOUNTER — Ambulatory Visit: Payer: Self-pay | Admitting: Internal Medicine

## 2013-05-10 ENCOUNTER — Encounter: Payer: Self-pay | Admitting: Adult Health

## 2013-05-10 ENCOUNTER — Ambulatory Visit (INDEPENDENT_AMBULATORY_CARE_PROVIDER_SITE_OTHER): Payer: 59 | Admitting: Adult Health

## 2013-05-10 VITALS — BP 146/78 | HR 67 | Temp 97.7°F | Resp 12 | Wt 117.0 lb

## 2013-05-10 DIAGNOSIS — R58 Hemorrhage, not elsewhere classified: Secondary | ICD-10-CM | POA: Insufficient documentation

## 2013-05-10 DIAGNOSIS — J3489 Other specified disorders of nose and nasal sinuses: Secondary | ICD-10-CM

## 2013-05-10 DIAGNOSIS — I998 Other disorder of circulatory system: Secondary | ICD-10-CM

## 2013-05-10 MED ORDER — FLUTICASONE PROPIONATE 50 MCG/ACT NA SUSP: NASAL | Status: DC

## 2013-05-10 NOTE — Progress Notes (Signed)
Subjective:    Patient ID: Tiffany Grimes, female    DOB: Dec 06, 1928, 79 y.o.   MRN: [id number]  HPI  Patient presents status post hitting the right side of her forehead against the corner of her kitchen cabinet as she leaned over to clean the counter. She did break the skin but notes that it was very little blood. She denies loss of consciousness, lightheadedness or dizziness, headache. She is on xarelto 15 mg which was started by Dr. Gwen Pounds for afib. She contacted his office the day after and spoke with Lupita Leash, NP. She was advised to keep an eye on it and if anything changed or if she developed symptoms to call back. She has not had any other symptoms. She has continued the xarelto daily.  She also would like to report that she recently saw a dermatologist and was diagnosed with lentigo. She would like this added to her medical history. She is concerned that these pigmented areas make her "appear dirty". She would not like anyone to think that she does not bathe. She is embarrassed about these area.  The patient is also reporting thick secretions draining from her sinuses mainly in the morning. She does not have any other symptoms - no fever, chills, cough, shortness of breath. Reports that this started several weeks ago. She has used a nasal spray in the past but does not remember the name.   Current Outpatient Prescriptions on File Prior to Visit  Medication Sig Dispense Refill  . aspirin 325 MG tablet Take 325 mg by mouth daily.      Marland Kitchen atenolol (TENORMIN) 25 MG tablet TAKE 1 TABLET TWICE A DAY  180 tablet  1  . atorvastatin (LIPITOR) 20 MG tablet TAKE 1 TABLET DAILY  90 tablet  2  . brimonidine (ALPHAGAN P) 0.1 % SOLN Place 1 drop into both eyes 2 (two) times daily.        Marland Kitchen escitalopram (LEXAPRO) 10 MG tablet Take 1 tablet (10 mg total) by mouth daily.  30 tablet  1  . latanoprost (XALATAN) 0.005 % ophthalmic solution Place 1 drop into both eyes 2 (two) times daily.        Marland Kitchen levothyroxine  (SYNTHROID, LEVOTHROID) 50 MCG tablet TAKE 1 TABLET DAILY  90 tablet  1  . lisinopril (PRINIVIL,ZESTRIL) 5 MG tablet TAKE 1 TABLET BY MOUTH EVERY DAY  30 tablet  0  . NEXIUM 40 MG capsule TAKE 1 CAPSULE DAILY BEFORE BREAKFAST  90 capsule  1  . Rivaroxaban (XARELTO) 15 MG TABS tablet Take 15 mg by mouth daily.      . sucralfate (CARAFATE) 1 G tablet TAKE 1 TABLET (1 GM TOTAL) TWICE A DAY  180 tablet  3   No current facility-administered medications on file prior to visit.    Review of Systems  Constitutional: Negative.   HENT: Positive for postnasal drip. Negative for rhinorrhea, sinus pressure and sore throat.   Eyes: Negative.   Respiratory: Negative.   Cardiovascular: Negative.   Neurological: Negative for dizziness, speech difficulty, light-headedness, numbness and headaches.  Hematological: Bruises/bleeds easily.       On xarelto  Psychiatric/Behavioral: Negative.   All other systems reviewed and are negative.       Objective:   Physical Exam  Constitutional: She is oriented to person, place, and time. She appears well-developed and well-nourished. No distress.  HENT:  Head: Normocephalic.  Ecchymosis right side of forehead  Eyes: Conjunctivae and EOM are normal. Pupils are equal,  round, and reactive to light.  Neck: Normal range of motion.  Cardiovascular: Normal rate and regular rhythm.   Pulmonary/Chest: Effort normal. No respiratory distress.  Musculoskeletal: Normal range of motion.  Lymphadenopathy:    She has no cervical adenopathy.  Neurological: She is alert and oriented to person, place, and time. No cranial nerve deficit. Coordination normal.  Skin:  Ecchymosis right side of forehead  Psychiatric: She has a normal mood and affect. Her behavior is normal. Judgment and thought content normal.       Assessment & Plan:

## 2013-05-10 NOTE — Patient Instructions (Signed)
  Please report any sudden onset headaches, dizziness or lightheadedness.  Use caution to avoid injury. Please report injury immediately since you are on Xarelto.  I have sent in a prescription for Flonase nasal spray. Use 2 sprays into each nostril daily. This is safe to use it year round.  Please call with any questions or concerns.

## 2013-05-10 NOTE — Assessment & Plan Note (Signed)
Start Flonase 2 sprays into each nostril daily. Prescription [facility]

## 2013-05-10 NOTE — Assessment & Plan Note (Signed)
Patient is 10 days s/p injury to the right side of forehead by hitting head against the corner of her kitchen cabinet. Ecchymosis subsiding. She does not have any neuro deficits. Did not report any at the time. She called her cardiologist office and was advised to "keep and eye on it". Continue with the xarelto. Patient to call with any questions or concerns. Advised to report injury immediately since she is on a blood thinner. Pt agreed.

## 2013-05-21 ENCOUNTER — Encounter: Payer: Self-pay | Admitting: Adult Health

## 2013-05-21 ENCOUNTER — Ambulatory Visit (INDEPENDENT_AMBULATORY_CARE_PROVIDER_SITE_OTHER): Payer: 59 | Admitting: Adult Health

## 2013-05-21 VITALS — BP 110/78 | HR 78 | Temp 97.7°F | Resp 12 | Wt 118.5 lb

## 2013-05-21 DIAGNOSIS — M79609 Pain in unspecified limb: Secondary | ICD-10-CM

## 2013-05-21 DIAGNOSIS — M79604 Pain in right leg: Secondary | ICD-10-CM | POA: Insufficient documentation

## 2013-05-21 NOTE — Assessment & Plan Note (Signed)
Pain in anterior lower portion of right leg. Only occurs in the evening after she has been on it all day. ? Circulation issues. Pedal pulse faint. Posterior tibial pulse not palpable. Right foot cool. Suggested doppler study. She refuses at this time. Suggested xray to see if possibility of injury to lower tibia. She refuses at this time. Not sure how she would want me to treat (?) She will call if symptoms do not improve within 2 weeks.

## 2013-05-21 NOTE — Progress Notes (Signed)
  Subjective:    Patient ID: Tiffany Grimes, female    DOB: 01-Apr-1929, 79 y.o.   MRN: [id number]  HPI  Patient is a pleasant 79 y/o female who presents to clinic with c/o right anterior lower extremity pain. Denies injury to the area. Denies swelling, redness or irritation. She is not sure if she may have bumped the dishwasher while the door was opened.  Current Outpatient Prescriptions on File Prior to Visit  Medication Sig Dispense Refill  . aspirin 325 MG tablet Take 325 mg by mouth daily.      Marland Kitchen atenolol (TENORMIN) 25 MG tablet TAKE 1 TABLET TWICE A DAY  180 tablet  1  . atorvastatin (LIPITOR) 20 MG tablet TAKE 1 TABLET DAILY  90 tablet  2  . brimonidine (ALPHAGAN P) 0.1 % SOLN Place 1 drop into both eyes 2 (two) times daily.        Marland Kitchen escitalopram (LEXAPRO) 10 MG tablet Take 1 tablet (10 mg total) by mouth daily.  30 tablet  1  . fluticasone (FLONASE) 50 MCG/ACT nasal spray 2 sprays into each nostril daily  16 g  0  . latanoprost (XALATAN) 0.005 % ophthalmic solution Place 1 drop into both eyes 2 (two) times daily.        Marland Kitchen levothyroxine (SYNTHROID, LEVOTHROID) 50 MCG tablet TAKE 1 TABLET DAILY  90 tablet  1  . lisinopril (PRINIVIL,ZESTRIL) 5 MG tablet TAKE 1 TABLET BY MOUTH EVERY DAY  30 tablet  0  . NEXIUM 40 MG capsule TAKE 1 CAPSULE DAILY BEFORE BREAKFAST  90 capsule  1  . Rivaroxaban (XARELTO) 15 MG TABS tablet Take 15 mg by mouth daily.      . sucralfate (CARAFATE) 1 G tablet TAKE 1 TABLET (1 GM TOTAL) TWICE A DAY  180 tablet  3   No current facility-administered medications on file prior to visit.     Review of Systems  Musculoskeletal:       Right leg pain mainly in the evening. Causes her to limp at times.       Objective:   Physical Exam  Constitutional: She is oriented to person, place, and time. She appears well-developed and well-nourished. No distress.  Cardiovascular:  Posterior tibial pulse right not palpable. Pedal pulse right foot faint.  Musculoskeletal:  Normal range of motion. She exhibits no edema and no tenderness.  RLE with no s/s injury. No bruising. No edema, erythema, broken skin.   Neurological: She is alert and oriented to person, place, and time.  Skin:  Right foot cool to touch  Psychiatric: She has a normal mood and affect. Her behavior is normal.          Assessment & Plan:

## 2013-06-03 ENCOUNTER — Emergency Department: Payer: Self-pay | Admitting: Emergency Medicine

## 2013-06-03 ENCOUNTER — Telehealth: Payer: Self-pay | Admitting: Internal Medicine

## 2013-06-03 LAB — URINALYSIS, COMPLETE
Bilirubin,UR: NEGATIVE
Leukocyte Esterase: NEGATIVE
Nitrite: NEGATIVE
RBC,UR: 12 /HPF (ref 0–5)
Squamous Epithelial: <1

## 2013-06-03 NOTE — Telephone Encounter (Signed)
Patient Information:  Caller Name: Vanisha  Phone: [phone]  Patient: Marlei, Glomski  Gender: Female  DOB: 03/17/1929  Age: 79 Years  PCP: Orville Govern  Office Follow Up:  Does the office need to follow up with this patient?: No  Instructions For The Office: N/A  RN Note:  Since office is now closed, advised patient to go to [facility] regarding her blood pressure and symptoms. Patient verbalized understanding.  Symptoms  Reason For Call & Symptoms: Mild dizziness/lightheadedness when walking, but went away quickly. BP 171/113  Reviewed Health History In EMR: Yes  Reviewed Medications In EMR: Yes  Reviewed Allergies In EMR: Yes  Reviewed Surgeries / Procedures: Yes  Date of Onset of Symptoms: 06/03/2013  Treatments Tried: Taking BP meds as prescribed.  Treatments Tried Worked: No  Guideline(s) Used:  High Blood Pressure  Disposition Per Guideline:   See Today in Office  Reason For Disposition Reached:   BP > 180/110  Advice Given:  N/A  Patient Will Follow Care Advice:  YES

## 2013-06-04 ENCOUNTER — Ambulatory Visit (INDEPENDENT_AMBULATORY_CARE_PROVIDER_SITE_OTHER): Payer: 59 | Admitting: Internal Medicine

## 2013-06-04 ENCOUNTER — Encounter: Payer: Self-pay | Admitting: Internal Medicine

## 2013-06-04 VITALS — BP 126/76 | HR 85 | Temp 97.5°F | Resp 12 | Ht 66.0 in | Wt 116.8 lb

## 2013-06-04 DIAGNOSIS — F0390 Unspecified dementia without behavioral disturbance: Secondary | ICD-10-CM

## 2013-06-04 DIAGNOSIS — I4891 Unspecified atrial fibrillation: Secondary | ICD-10-CM

## 2013-06-04 DIAGNOSIS — I10 Essential (primary) hypertension: Secondary | ICD-10-CM

## 2013-06-04 DIAGNOSIS — F0392 Unspecified dementia, unspecified severity, with psychotic disturbance: Secondary | ICD-10-CM

## 2013-06-04 MED ORDER — LISINOPRIL 20 MG PO TABS: ORAL_TABLET | ORAL | Status: DC

## 2013-06-04 NOTE — Assessment & Plan Note (Addendum)
yesterday's ER visit for uncontrolled symptomatic hypertensive urgency was treated with iv labetalol and clonidine. Today's bp is normal and I do not think she will tolerate the additon of daily clonidine, and amlodipine.  Therefore I am  increasing lisinopril to 20 mg daily.    return in one week for bp check.

## 2013-06-04 NOTE — Progress Notes (Signed)
Patient ID: Tiffany Grimes, female   DOB: 02-14-1929, 79 y.o.   MRN: [id number]  Patient Active Problem List   Diagnosis Date Noted  . Pain in inferior right lower extremity 05/21/2013  . Ecchymosis 05/10/2013  . Anxiety state, unspecified 10/28/2012  . Routine general medical examination at a health care facility 06/15/2012  . 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 Osteoporosis, post-menopausal 02/11/2012  . Lumbago with sciatica of right side 02/11/2012  . Posterior neck pain 02/11/2012  . Other and unspecified hyperlipidemia 12/16/2011  . Hx of thyroid nodule   . Atrial fibrillation, new onset 08/02/2011  . Chest pain, atypical 08/02/2011  . TIA (transient ischemic attack)   . Barrett esophagus   . Hypertension 03/29/2011    Subjective:  CC:   Chief Complaint  Patient presents with  . Acute Visit    Patient staed she went to ER for BP but does not remember how  high Bp was.    HPI:   Tiffany Grimes a 79 y.o. female who presents for follow up on ER visit yesterday for uncontrolled hypertension presenting with suddon onset of loss of balance, falling backwards.  Symptom occurred yesterday afternoon while grocery shopping.  She did not fall. She returned home and her BP was > 170 so [facility], was not treated but sen timmediately  to ER and was seen within 30 minutes. Review of ER records indicate that a head CT and EKG were done.  She was treated by Dr. Margarita Grizzle.  Was given labetalol iv,  lisinopril 20 mg  And clonidine 0.1 mg at 8 pm last evening . She was discharged with 3 rxs which she has not filled: lisinopril 20 mg,  clonidine 0.1 mg and amlodipine 5 mg   She feels fine today,  Recalls that there were no inciting events yesterday, including conflict with husband,  Use of OTC NSAIDS or decongestants,  Or missed medications.  She states that she takes her lisinopril 5 mg daily in the evenings,  And does not think she has  missed any doses.  Dementia:   After refusing to have an MRI this summer, she now states that "I think I have Alzheimers dementia. " She reports discussing this with her daughter who is spreading rumors about her dementia to others.  CT scan reviewed with patient today.     Past Medical History  Diagnosis Date  . Thyroid disease   . TIA (transient ischemic attack) June 2012  . TIA (transient ischemic attack) June 2012  . Barrett esophagus     due for EGD July 2012, [place]  . Reflux   . Hx of thyroid nodule July 2012    benign biopsy   . Lentigo     Past Surgical History  Procedure Laterality Date  . Thyroid surgery  june 18    nodule removed  . Hernia repair  2008    left inguinal       The following portions of the patient's history were reviewed and updated as appropriate: Allergies, current medications, and problem list.    Review of Systems:   12 Pt  review of systems was negative except those addressed in the HPI,     History   Social History  . Marital Status: Married    Spouse Name: N/A    Number of Children: N/A  . Years of Education: N/A   Occupational History  . Not on file.   Social  History Main Topics  . Smoking status: Never Smoker   . Smokeless tobacco: Never Used  . Alcohol Use: No  . Drug Use: No  . Sexual Activity: Not on file   Other Topics Concern  . Not on file   Social History Narrative  . No narrative on file    Objective:  Filed Vitals:   06/04/13 1336  BP: 126/76  Pulse:   Temp:   Resp:      General appearance: alert, cooperative and appears stated age Ears: normal TM's and external ear canals both ears Throat: lips, mucosa, and tongue normal; teeth and gums normal Neck: no adenopathy, no carotid bruit, supple, symmetrical, trachea midline and thyroid not enlarged, symmetric, no tenderness/mass/nodules Back: symmetric, no curvature. ROM normal. No CVA tenderness. Lungs: clear to auscultation bilaterally Heart: regular rate and rhythm, S1, S2  normal, no murmur, click, rub or gallop Abdomen: soft, non-tender; bowel sounds normal; no masses,  no organomegaly Pulses: 2+ and symmetric Skin: Skin color, texture, turgor normal. No rashes or lesions Lymph nodes: Cervical, supraclavicular, and axillary nodes normal.  Assessment and Plan:  Presenile dementia with paranoia Patient has accepted her diagnosis . Head CT was done in the ER and small vessel disease and corticol loss noted   Hypertension yesterday's ER visit for uncontrolled symptomatic hypertensive urgency was treated with iv labetalol and clonidine. Today's bp is normal and I do not think she will tolerate the additon of daily clonidine, and amlodipine.  Therefore I am  increasing lisinopril to 20 mg daily.    return in one week for bp check.   Atrial fibrillation, new onset On xarelto for reduction of embolic stroke risk given age and CHADS score. . Rate controlled on atenolol bid.   A total of 40 minutes was spent with patient more than half of which was spent in counseling, reviewing records from other prviders and coordination of care.   Updated Medication List Outpatient Encounter Prescriptions as of 06/04/2013  Medication Sig  . aspirin 325 MG tablet Take 325 mg by mouth daily.  Marland Kitchen atenolol (TENORMIN) 25 MG tablet TAKE 1 TABLET TWICE A DAY  . atorvastatin (LIPITOR) 20 MG tablet TAKE 1 TABLET DAILY  . brimonidine (ALPHAGAN P) 0.1 % SOLN Place 1 drop into both eyes 2 (two) times daily.    Marland Kitchen escitalopram (LEXAPRO) 10 MG tablet Take 1 tablet (10 mg total) by mouth daily.  . fluticasone (FLONASE) 50 MCG/ACT nasal spray 2 sprays into each nostril daily  . latanoprost (XALATAN) 0.005 % ophthalmic solution Place 1 drop into both eyes 2 (two) times daily.    Marland Kitchen levothyroxine (SYNTHROID, LEVOTHROID) 50 MCG tablet TAKE 1 TABLET DAILY  . lisinopril (PRINIVIL,ZESTRIL) 20 MG tablet TAKE 1 TABLET BY MOUTH EVERY DAY  . NEXIUM 40 MG capsule TAKE 1 CAPSULE DAILY BEFORE BREAKFAST  .  Rivaroxaban (XARELTO) 15 MG TABS tablet Take 15 mg by mouth daily.  . sucralfate (CARAFATE) 1 G tablet TAKE 1 TABLET (1 GM TOTAL) TWICE A DAY  . [DISCONTINUED] lisinopril (PRINIVIL,ZESTRIL) 5 MG tablet TAKE 1 TABLET BY MOUTH EVERY DAY     No orders of the defined types were placed in this encounter.    No Follow-up on file.

## 2013-06-04 NOTE — Progress Notes (Signed)
Pre-visit discussion using our clinic review tool. No additional management support is needed unless otherwise documented below in the visit note.  

## 2013-06-04 NOTE — Assessment & Plan Note (Addendum)
Patient has accepted her diagnosis . Head CT was done in the ER and small vessel disease and corticol loss noted

## 2013-06-04 NOTE — Assessment & Plan Note (Addendum)
On xarelto for reduction of embolic stroke risk given age and CHADS score. . Rate controlled on atenolol bid.

## 2013-06-04 NOTE — Patient Instructions (Addendum)
Your blood pressure is well controlled today on the higher dose of lisinopril  I  am increasing your dose of lisinopril to 20 mg daily .     You can return  in one week  for a bp check here   PLEASE BRING YOUR BLOOD PRESSURE READINGS WITH YOU

## 2013-06-04 NOTE — Telephone Encounter (Signed)
FYI

## 2013-06-08 ENCOUNTER — Telehealth: Payer: Self-pay | Admitting: Internal Medicine

## 2013-06-08 NOTE — Telephone Encounter (Signed)
The patient is aware of her appointment at [facility] on 12.18.14 @ 10:30.

## 2013-06-18 ENCOUNTER — Other Ambulatory Visit: Payer: Self-pay | Admitting: Internal Medicine

## 2013-06-30 ENCOUNTER — Other Ambulatory Visit: Payer: Self-pay | Admitting: Internal Medicine

## 2013-06-30 ENCOUNTER — Ambulatory Visit (INDEPENDENT_AMBULATORY_CARE_PROVIDER_SITE_OTHER): Payer: 59 | Admitting: Internal Medicine

## 2013-06-30 ENCOUNTER — Encounter: Payer: Self-pay | Admitting: Internal Medicine

## 2013-06-30 ENCOUNTER — Ambulatory Visit: Payer: 59

## 2013-06-30 VITALS — BP 136/66 | HR 72 | Temp 98.0°F | Resp 12 | Ht 66.0 in | Wt 118.5 lb

## 2013-06-30 DIAGNOSIS — I4891 Unspecified atrial fibrillation: Secondary | ICD-10-CM

## 2013-06-30 DIAGNOSIS — R238 Other skin changes: Secondary | ICD-10-CM

## 2013-06-30 DIAGNOSIS — I998 Other disorder of circulatory system: Secondary | ICD-10-CM

## 2013-06-30 DIAGNOSIS — R58 Hemorrhage, not elsewhere classified: Secondary | ICD-10-CM

## 2013-06-30 DIAGNOSIS — G459 Transient cerebral ischemic attack, unspecified: Secondary | ICD-10-CM

## 2013-06-30 DIAGNOSIS — Z79899 Other long term (current) drug therapy: Secondary | ICD-10-CM

## 2013-06-30 DIAGNOSIS — I10 Essential (primary) hypertension: Secondary | ICD-10-CM

## 2013-06-30 LAB — PROTIME-INR: Prothrombin Time: 23.4 seconds — ABNORMAL HIGH (ref 11.6–15.2)

## 2013-06-30 LAB — CBC WITH DIFFERENTIAL/PLATELET
Basophils Absolute: 0.1 10*3/uL (ref 0.0–0.1)
Eosinophils Relative: 2.2 % (ref 0.0–5.0)
HCT: 40.9 % (ref 36.0–46.0)
Hemoglobin: 13.5 g/dL (ref 12.0–15.0)
Lymphocytes Relative: 18.3 % (ref 12.0–46.0)
Monocytes Relative: 9.9 % (ref 3.0–12.0)
Neutro Abs: 4.9 10*3/uL (ref 1.4–7.7)
Platelets: 208 10*3/uL (ref 150.0–400.0)
RDW: 14.5 % (ref 11.5–14.6)
WBC: 7.1 10*3/uL (ref 4.5–10.5)

## 2013-06-30 LAB — BASIC METABOLIC PANEL
CO2: 27 mEq/L (ref 19–32)
Chloride: 103 mEq/L (ref 96–112)
Creatinine, Ser: 1 mg/dL (ref 0.4–1.2)
Glucose, Bld: 83 mg/dL (ref 70–99)
Sodium: 141 mEq/L (ref 135–145)

## 2013-06-30 MED ORDER — AMLODIPINE BESYLATE 5 MG PO TABS: 5.0000 mg | ORAL_TABLET | Freq: Every day | ORAL | Status: DC

## 2013-06-30 MED ORDER — LEVOTHYROXINE SODIUM 50 MCG PO TABS: ORAL_TABLET | ORAL | Status: DC

## 2013-06-30 MED ORDER — ATENOLOL 25 MG PO TABS: ORAL_TABLET | ORAL | Status: DC

## 2013-06-30 MED ORDER — RIVAROXABAN 15 MG PO TABS: 15.0000 mg | ORAL_TABLET | Freq: Every day | ORAL | Status: DC

## 2013-06-30 MED ORDER — LISINOPRIL 20 MG PO TABS: ORAL_TABLET | ORAL | Status: DC

## 2013-06-30 NOTE — Progress Notes (Signed)
Pre visit review using our clinic review tool, if applicable. No additional management support is needed unless otherwise documented below in the visit note. 

## 2013-06-30 NOTE — Patient Instructions (Signed)
i am prescribing a medicine called amlodipine to take IF your blood pressure is greater than 160/90  Please continue the lisinopril 20 mg daily and the atenolol 25 mg twice daily as you are doing   I am checking some labs because of your easy bruising

## 2013-06-30 NOTE — Progress Notes (Signed)
Patient ID: Tiffany Grimes, female   DOB: 1929-01-18, 79 y.o.   MRN: [id number]   Patient Active Problem List   Diagnosis Date Noted  . Pain in inferior right lower extremity 05/21/2013  . Ecchymosis 05/10/2013  . Anxiety state, unspecified 10/28/2012  . Routine general medical examination at a health care facility 06/15/2012  . 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 Osteoporosis, post-menopausal 02/11/2012  . Lumbago with sciatica of right side 02/11/2012  . Posterior neck pain 02/11/2012  . Other and unspecified hyperlipidemia 12/16/2011  . Hx of thyroid nodule   . Atrial fibrillation, new onset 08/02/2011  . Chest pain, atypical 08/02/2011  . TIA (transient ischemic attack)   . Barrett esophagus   . Hypertension 03/29/2011    Subjective:  CC:   Chief Complaint  Patient presents with  . Follow-up    HPI:   Tiffany Grimes a 79 y.o. female who presents for one month follow up on on labile hypertension. She continues to report labile blood pressures when she measures it  at home. However she did not bring her log with her. She states that she is taking her medications as directed. She has considered going to the ER when she finds her blood pressure above 160. She denies any recurrent headaches or visual changes in her blood pressure is elevated. She believes that her elevations occurring during times of emotional or physical stress.  She brings up a new Concern that she has some type of bleeding disorder because of concern raised by her hair dresser 4 months ago after seeing her with a black eye that occurred when she bumped her lower forehead on a cabinet at home. The Bruise has since resolved.  Denies gums bleeding,  But has a large resolving ecchymosis covering the entire dorsal surface of her right foot that occurred with no inciting trauma . She's taking Xarelto for newly discovered atrial fibrillation history of TIA.      Past Medical History  Diagnosis Date  .  Thyroid disease   . TIA (transient ischemic attack) June 2012  . TIA (transient ischemic attack) June 2012  . Barrett esophagus     due for EGD July 2012, [place]  . Reflux   . Hx of thyroid nodule July 2012    benign biopsy   . Lentigo     Past Surgical History  Procedure Laterality Date  . Thyroid surgery  june 18    nodule removed  . Hernia repair  2008    left inguinal       The following portions of the patient's history were reviewed and updated as appropriate: Allergies, current medications, and problem list.    Review of Systems:   12 Pt  review of systems was negative except those addressed in the HPI,     History   Social History  . Marital Status: Married    Spouse Name: N/A    Number of Children: N/A  . Years of Education: N/A   Occupational History  . Not on file.   Social History Main Topics  . Smoking status: Never Smoker   . Smokeless tobacco: Never Used  . Alcohol Use: No  . Drug Use: No  . Sexual Activity: Not on file   Other Topics Concern  . Not on file   Social History Narrative  . No narrative on file    Objective:  Filed Vitals:   06/30/13 0853  BP: 136/66  Pulse: 72  Temp:  98 F (36.7 C)  Resp: 12     General appearance: alert, cooperative and appears stated age Ears: normal TM's and external ear canals both ears Throat: lips, mucosa, and tongue normal; teeth and gums normal Neck: no adenopathy, no carotid bruit, supple, symmetrical, trachea midline and thyroid not enlarged, symmetric, no tenderness/mass/nodules Back: symmetric, no curvature. ROM normal. No CVA tenderness. Lungs: clear to auscultation bilaterally Heart: regular rate and rhythm, S1, S2 normal, no murmur, click, rub or gallop Abdomen: soft, non-tender; bowel sounds normal; no masses,  no organomegaly Pulses: 2+ and symmetric Skin: Skin color, texture, turgor normal. No rashes or lesions Lymph nodes: Cervical, supraclavicular, and axillary nodes  normal.  Assessment and Plan:  Hypertension Explained to her that I can adjust her blood pressure medications I do not know what her blood pressure is running at home given his lability. We will continue lisinopril 20 mg daily and atenolol twice daily 25 mg.. I have given her a when necessary dose of amlodipine to use since she has a inclination to go to the ER where her blood pressure is elevated. I asked her to use the amlodipine for blood pressures of 160 or greater systolic  TIA (transient ischemic attack) Patient has been taking aspirin and statin since her TIA in July 2014. Carotid ultrasounds were unchanged,  No significant stenosis,  Repeat in 2 years.     Atrial fibrillation, new onset On xarelto for reduction of embolic stroke risk given age and CHADS score. . Rate controlled on atenolol bid.     Ecchymosis Checking platelet count,  pro time and PTT given recurrent ecchymosis and concurrent use of Xarelto   Updated Medication List Outpatient Encounter Prescriptions as of 06/30/2013  Medication Sig  . aspirin 325 MG tablet Take 325 mg by mouth daily.  Marland Kitchen atenolol (TENORMIN) 25 MG tablet TAKE 1 TABLET TWICE A DAY  . atorvastatin (LIPITOR) 20 MG tablet TAKE 1 TABLET DAILY  . brimonidine (ALPHAGAN P) 0.1 % SOLN Place 1 drop into both eyes 2 (two) times daily.    . fluticasone (FLONASE) 50 MCG/ACT nasal spray 2 sprays into each nostril daily  . latanoprost (XALATAN) 0.005 % ophthalmic solution Place 1 drop into both eyes 2 (two) times daily.    Marland Kitchen levothyroxine (SYNTHROID, LEVOTHROID) 50 MCG tablet TAKE 1 TABLET DAILY  . lisinopril (PRINIVIL,ZESTRIL) 20 MG tablet TAKE 1 TABLET BY MOUTH EVERY DAY  . sucralfate (CARAFATE) 1 G tablet TAKE 1 TABLET (1 GM TOTAL) TWICE A DAY  . [DISCONTINUED] atenolol (TENORMIN) 25 MG tablet TAKE 1 TABLET TWICE A DAY  . [DISCONTINUED] levothyroxine (SYNTHROID, LEVOTHROID) 50 MCG tablet TAKE 1 TABLET DAILY  . [DISCONTINUED] lisinopril (PRINIVIL,ZESTRIL)  20 MG tablet TAKE 1 TABLET BY MOUTH EVERY DAY  . [DISCONTINUED] NEXIUM 40 MG capsule TAKE 1 CAPSULE DAILY BEFORE BREAKFAST  . [DISCONTINUED] Rivaroxaban (XARELTO) 15 MG TABS tablet Take 15 mg by mouth daily.  Marland Kitchen amLODipine (NORVASC) 5 MG tablet Take 1 tablet (5 mg total) by mouth daily. As needed for bp > 160  . [DISCONTINUED] escitalopram (LEXAPRO) 10 MG tablet Take 1 tablet (10 mg total) by mouth daily.     Orders Placed This Encounter  Procedures  . CBC with Differential  . Protime-INR  . PTT  . Basic metabolic panel    No Follow-up on file.

## 2013-07-01 ENCOUNTER — Encounter: Payer: Self-pay | Admitting: Internal Medicine

## 2013-07-01 NOTE — Assessment & Plan Note (Addendum)
Checking platelet count,  pro time and PTT given recurrent ecchymosis and concurrent use of Xarelto

## 2013-07-01 NOTE — Assessment & Plan Note (Addendum)
Patient has been taking aspirin and statin since her TIA in July 2014. Carotid ultrasounds were unchanged,  No significant stenosis,  Repeat in 2 years.

## 2013-07-01 NOTE — Assessment & Plan Note (Signed)
On xarelto for reduction of embolic stroke risk given age and CHADS score. . Rate controlled on atenolol bid.  

## 2013-07-01 NOTE — Assessment & Plan Note (Addendum)
Explained to her that I can adjust her blood pressure medications I do not know what her blood pressure is running at home given his lability. We will continue lisinopril 20 mg daily and atenolol twice daily 25 mg.. I have given her a when necessary dose of amlodipine to use since she has a inclination to go to the ER where her blood pressure is elevated. I asked her to use the amlodipine for blood pressures of 160 or greater systolic

## 2013-07-02 ENCOUNTER — Encounter: Payer: Self-pay | Admitting: *Deleted

## 2013-07-08 ENCOUNTER — Ambulatory Visit: Payer: Self-pay | Admitting: Internal Medicine

## 2013-07-26 ENCOUNTER — Ambulatory Visit (INDEPENDENT_AMBULATORY_CARE_PROVIDER_SITE_OTHER): Payer: 59 | Admitting: Adult Health

## 2013-07-26 ENCOUNTER — Encounter (INDEPENDENT_AMBULATORY_CARE_PROVIDER_SITE_OTHER): Payer: Self-pay

## 2013-07-26 ENCOUNTER — Encounter: Payer: Self-pay | Admitting: Adult Health

## 2013-07-26 VITALS — BP 108/68 | HR 81 | Resp 12 | Ht 66.0 in | Wt 120.5 lb

## 2013-07-26 DIAGNOSIS — M79604 Pain in right leg: Secondary | ICD-10-CM

## 2013-07-26 DIAGNOSIS — M79609 Pain in unspecified limb: Secondary | ICD-10-CM

## 2013-07-26 NOTE — Progress Notes (Signed)
Subjective:    Patient ID: Tiffany Grimes, female    DOB: 06-17-1929, 80 y.o.   MRN: [id number]  HPI  Patient is a pleasant 80 year old female who presents to clinic with complaints of right anterior lower extremity pain. She was seen 05/21/2013 for the same problem. She continues to deny injury or swelling in the area. During previous visit, recommendation for Doppler study was made; however, patient did not want to have this done. She now has some discoloration on right toes up toward mid foot. She is agreeable to have testing done at this time. Continues to report pain worse in the evening. She is also having mild symptoms of her left LE. She denies pain in the LLE but has coolness.   Current Outpatient Prescriptions on File Prior to Visit  Medication Sig Dispense Refill  . amLODipine (NORVASC) 5 MG tablet Take 1 tablet (5 mg total) by mouth daily. As needed for bp > 160  30 tablet  0  . aspirin 325 MG tablet Take 325 mg by mouth daily.      Marland Kitchen atenolol (TENORMIN) 25 MG tablet TAKE 1 TABLET TWICE A DAY  180 tablet  1  . atorvastatin (LIPITOR) 20 MG tablet TAKE 1 TABLET DAILY  90 tablet  2  . brimonidine (ALPHAGAN P) 0.1 % SOLN Place 1 drop into both eyes 2 (two) times daily.        . fluticasone (FLONASE) 50 MCG/ACT nasal spray 2 sprays into each nostril daily  16 g  0  . latanoprost (XALATAN) 0.005 % ophthalmic solution Place 1 drop into both eyes 2 (two) times daily.        Marland Kitchen levothyroxine (SYNTHROID, LEVOTHROID) 50 MCG tablet TAKE 1 TABLET DAILY  90 tablet  0  . lisinopril (PRINIVIL,ZESTRIL) 20 MG tablet TAKE 1 TABLET BY MOUTH EVERY DAY  90 tablet  3  . NEXIUM 40 MG capsule TAKE 1 CAPSULE DAILY BEFORE BREAKFAST  90 capsule  3  . Rivaroxaban (XARELTO) 15 MG TABS tablet Take 1 tablet (15 mg total) by mouth daily.  90 tablet  2  . sucralfate (CARAFATE) 1 G tablet TAKE 1 TABLET (1 GM TOTAL) TWICE A DAY  180 tablet  3   No current facility-administered medications on file prior to visit.     Review of Systems  Constitutional: Negative.   Respiratory: Negative.   Cardiovascular: Negative for leg swelling.  Musculoskeletal:       Pain right LE. No swelling, injury to area. She has noticed discoloration of her foot and toes.  Neurological: Negative for numbness.       Objective:   Physical Exam  Constitutional: She is oriented to person, place, and time. She appears well-developed and well-nourished. No distress.  Cardiovascular: Normal rate and regular rhythm.   Right posterior tibial pulse non palpable. Right pedal pulse faint.  Pulmonary/Chest: Effort normal. No respiratory distress.  Musculoskeletal: Normal range of motion. She exhibits no edema.  Right toes to mid foot with mild edema. There is a bluish discoloration noted in this area. There is no s/s of injury. No broken skin or puncture wounds. Skin in between toes are intact.  Neurological: She is alert and oriented to person, place, and time.  Skin:  Right toes to mid foot cool to touch.   Psychiatric: She has a normal mood and affect. Her behavior is normal. Judgment and thought content normal.    BP 108/68  Pulse 81  Resp 12  Ht  5\' 6"  (1.676 m)  Wt 120 lb 8 oz (54.658 kg)  BMI 19.46 kg/m2  SpO2 98%       Assessment & Plan:

## 2013-07-26 NOTE — Patient Instructions (Signed)
  I am sending you for a lower extremity doppler to check for circulation.  Amber will schedule this for you.  Once we have the results we will contact you.

## 2013-07-26 NOTE — Progress Notes (Signed)
Pre visit review using our clinic review tool, if applicable. No additional management support is needed unless otherwise documented below in the visit note. 

## 2013-07-26 NOTE — Assessment & Plan Note (Signed)
Pt was seen with same problem 05/21/13 with recommendation for doppler but she refused at that time. She is agreeable to have this done now. Discoloration of right toes up to mid foot. No injury. Suspect circulatory etiology. Send for arterial doppler bilateral LE. Pt also having coolness and mild symptoms of the LLE.

## 2013-07-30 ENCOUNTER — Ambulatory Visit: Payer: Self-pay | Admitting: Internal Medicine

## 2013-08-02 ENCOUNTER — Ambulatory Visit (INDEPENDENT_AMBULATORY_CARE_PROVIDER_SITE_OTHER): Payer: 59 | Admitting: Adult Health

## 2013-08-02 ENCOUNTER — Encounter: Payer: Self-pay | Admitting: Adult Health

## 2013-08-02 VITALS — BP 146/78 | HR 96 | Temp 97.7°F | Resp 12 | Wt 121.0 lb

## 2013-08-02 DIAGNOSIS — K644 Residual hemorrhoidal skin tags: Secondary | ICD-10-CM

## 2013-08-02 DIAGNOSIS — K602 Anal fissure, unspecified: Secondary | ICD-10-CM

## 2013-08-02 MED ORDER — POLYETHYLENE GLYCOL 3350 17 GM/SCOOP PO POWD: 17.0000 g | Freq: Every day | ORAL | Status: DC

## 2013-08-02 MED ORDER — HYDROCORTISONE 2.5 % RE CREA: 1.0000 "application " | TOPICAL_CREAM | Freq: Two times a day (BID) | RECTAL | Status: DC

## 2013-08-02 NOTE — Progress Notes (Signed)
   Subjective:    Patient ID: Tiffany Grimes, female    DOB: [birth year], 80 y.o.   MRN: [id number]  HPI  Pt is a pleasant 80 y/o female who presents to clinic with c/o rectal bleeding following bowel movements x 1 month. She reports hx of hemorrhoids x 33 years. She denies constipation but then describes her stool as "hard little balls". Pt is also on xarelto. CBC done 06/30/13 showed no anemia.    Current Outpatient Prescriptions on File Prior to Visit  Medication Sig Dispense Refill  . amLODipine (NORVASC) 5 MG tablet Take 1 tablet (5 mg total) by mouth daily. As needed for bp > 160  30 tablet  0  . aspirin 325 MG tablet Take 325 mg by mouth daily.      Marland Kitchen. atenolol (TENORMIN) 25 MG tablet TAKE 1 TABLET TWICE A DAY  180 tablet  1  . atorvastatin (LIPITOR) 20 MG tablet TAKE 1 TABLET DAILY  90 tablet  2  . brimonidine (ALPHAGAN P) 0.1 % SOLN Place 1 drop into both eyes 2 (two) times daily.        . fluticasone (FLONASE) 50 MCG/ACT nasal spray 2 sprays into each nostril daily  16 g  0  . latanoprost (XALATAN) 0.005 % ophthalmic solution Place 1 drop into both eyes 2 (two) times daily.        Marland Kitchen. levothyroxine (SYNTHROID, LEVOTHROID) 50 MCG tablet TAKE 1 TABLET DAILY  90 tablet  0  . lisinopril (PRINIVIL,ZESTRIL) 20 MG tablet TAKE 1 TABLET BY MOUTH EVERY DAY  90 tablet  3  . NEXIUM 40 MG capsule TAKE 1 CAPSULE DAILY BEFORE BREAKFAST  90 capsule  3  . Rivaroxaban (XARELTO) 15 MG TABS tablet Take 1 tablet (15 mg total) by mouth daily.  90 tablet  2  . sucralfate (CARAFATE) 1 G tablet TAKE 1 TABLET (1 GM TOTAL) TWICE A DAY  180 tablet  3   No current facility-administered medications on file prior to visit.     Review of Systems  Constitutional: Negative for fatigue.  Respiratory: Negative.   Cardiovascular: Negative.   Gastrointestinal: Positive for blood in stool. Negative for nausea, vomiting, abdominal pain, diarrhea and constipation.  Pain on rectum with BM     Objective:   Physical  Exam  Genitourinary: Guaiac negative stool.             Assessment & Plan:

## 2013-08-02 NOTE — Assessment & Plan Note (Addendum)
Very small, irritated annal fissure noted on rectum right beneath the external hemorrhoid. Start Miralax daily. Conservative treatment consisting of a high-fiber diet, adequate fluid intake, sitz baths, and topical analgesia. RTC in 4 weeks for follow up. Note greater than 30 minutes were spent in face-to-face communication with patient in the education, assessment, evaluation, planning and implementation of care pertaining to this problem

## 2013-08-02 NOTE — Progress Notes (Signed)
Pre visit review using our clinic review tool, if applicable. No additional management support is needed unless otherwise documented below in the visit note. 

## 2013-08-02 NOTE — Assessment & Plan Note (Signed)
Proctosol cream to the rectum twice a day. Start Miralax daily.

## 2013-08-02 NOTE — Patient Instructions (Addendum)
  You have a small external hemorrhoid and a small anal fissure.  Recommend high-fiber diet, adequate fluid intake, sitz baths.  Take Miralax daily as directed on the container. If your stools become too loose you can decrease the dose by taking it every other day or every 2 days.  I am also sending in a prescription for your hemorrhoid  Apply Proctosol external cream to your rectum twice daily.  Return for follow up in 1 month.

## 2013-08-06 ENCOUNTER — Encounter: Payer: Self-pay | Admitting: Internal Medicine

## 2013-08-08 ENCOUNTER — Telehealth: Payer: Self-pay | Admitting: Internal Medicine

## 2013-08-08 DIAGNOSIS — I739 Peripheral vascular disease, unspecified: Secondary | ICD-10-CM

## 2013-08-08 NOTE — Telephone Encounter (Signed)
And recent vascular studies of the  lower extremities showed atherosclerosis on both sides but not significant enough to cause a blockage.

## 2013-08-12 NOTE — Telephone Encounter (Signed)
Patient notified of results.

## 2013-08-25 ENCOUNTER — Other Ambulatory Visit: Payer: Self-pay | Admitting: *Deleted

## 2013-08-25 MED ORDER — AMLODIPINE BESYLATE 5 MG PO TABS
5.0000 mg | ORAL_TABLET | Freq: Every day | ORAL | Status: DC
Start: 2013-08-25 — End: 2013-11-30

## 2013-08-28 ENCOUNTER — Emergency Department: Payer: Self-pay | Admitting: Internal Medicine

## 2013-08-28 LAB — COMPREHENSIVE METABOLIC PANEL
ALBUMIN: 3.8 g/dL (ref 3.4–5.0)
ALK PHOS: 79 U/L
ALT: 37 U/L (ref 12–78)
ANION GAP: 2 — AB (ref 7–16)
BUN: 16 mg/dL (ref 7–18)
Bilirubin,Total: 0.6 mg/dL (ref 0.2–1.0)
Calcium, Total: 8.7 mg/dL (ref 8.5–10.1)
Chloride: 103 mmol/L (ref 98–107)
Co2: 31 mmol/L (ref 21–32)
Creatinine: 0.98 mg/dL (ref 0.60–1.30)
EGFR (African American): >60
EGFR (Non-African Amer.): 54 — ABNORMAL LOW
Glucose: 117 mg/dL — ABNORMAL HIGH (ref 65–99)
OSMOLALITY: 274 (ref 275–301)
Potassium: 4.4 mmol/L (ref 3.5–5.1)
SGOT(AST): 45 U/L — ABNORMAL HIGH (ref 15–37)
SODIUM: 136 mmol/L (ref 136–145)
Total Protein: 6.8 g/dL (ref 6.4–8.2)

## 2013-08-28 LAB — URINALYSIS, COMPLETE
Bilirubin,UR: NEGATIVE
GLUCOSE, UR: NEGATIVE mg/dL (ref 0–75)
KETONE: NEGATIVE
LEUKOCYTE ESTERASE: NEGATIVE
Nitrite: NEGATIVE
Ph: 6 (ref 4.5–8.0)
Protein: NEGATIVE
SPECIFIC GRAVITY: 1.013 (ref 1.003–1.030)

## 2013-08-28 LAB — CBC
HCT: 40.1 % (ref 35.0–47.0)
HGB: 13.2 g/dL (ref 12.0–16.0)
MCH: 28.1 pg (ref 26.0–34.0)
MCHC: 33 g/dL (ref 32.0–36.0)
MCV: 85 fL (ref 80–100)
Platelet: 182 10*3/uL (ref 150–440)
RBC: 4.71 10*6/uL (ref 3.80–5.20)
RDW: 15.2 % — ABNORMAL HIGH (ref 11.5–14.5)
WBC: 6.2 10*3/uL (ref 3.6–11.0)

## 2013-08-28 LAB — TROPONIN I: Troponin-I: <0.02 ng/mL

## 2013-08-30 ENCOUNTER — Ambulatory Visit: Payer: Medicare Other | Admitting: *Deleted

## 2013-08-30 VITALS — BP 154/88 | HR 69 | Temp 97.9°F | Resp 18

## 2013-08-30 DIAGNOSIS — I10 Essential (primary) hypertension: Secondary | ICD-10-CM

## 2013-08-30 LAB — MEASURE BLOOD PRESSURE

## 2013-08-30 NOTE — Progress Notes (Signed)
Patient seen 08/26/13 at [facility] ER for BP increase came in for BP check .

## 2013-08-31 ENCOUNTER — Telehealth: Payer: Self-pay | Admitting: Internal Medicine

## 2013-08-31 ENCOUNTER — Encounter: Payer: Self-pay | Admitting: Internal Medicine

## 2013-08-31 ENCOUNTER — Ambulatory Visit (INDEPENDENT_AMBULATORY_CARE_PROVIDER_SITE_OTHER): Payer: 59 | Admitting: Internal Medicine

## 2013-08-31 VITALS — BP 146/82 | HR 82 | Temp 97.4°F | Resp 16 | Wt 122.8 lb

## 2013-08-31 DIAGNOSIS — R0789 Other chest pain: Secondary | ICD-10-CM

## 2013-08-31 DIAGNOSIS — Z862 Personal history of diseases of the blood and blood-forming organs and certain disorders involving the immune mechanism: Secondary | ICD-10-CM

## 2013-08-31 DIAGNOSIS — I10 Essential (primary) hypertension: Secondary | ICD-10-CM

## 2013-08-31 DIAGNOSIS — E042 Nontoxic multinodular goiter: Secondary | ICD-10-CM

## 2013-08-31 DIAGNOSIS — Z8639 Personal history of other endocrine, nutritional and metabolic disease: Secondary | ICD-10-CM

## 2013-08-31 DIAGNOSIS — R918 Other nonspecific abnormal finding of lung field: Secondary | ICD-10-CM

## 2013-08-31 LAB — TSH: TSH: 2.33 u[IU]/mL (ref 0.35–5.50)

## 2013-08-31 NOTE — Assessment & Plan Note (Signed)
Concern about thyroid CA give thyroid nodules on CT.  Ultrasound ordered.

## 2013-08-31 NOTE — Assessment & Plan Note (Signed)
Recent episode and ER evaluation ruled out AMI.  Follow up with Dr Gwen Pounds next wee,

## 2013-08-31 NOTE — Progress Notes (Signed)
Pre-visit discussion using our clinic review tool. No additional management support is needed unless otherwise documented below in the visit note.  

## 2013-08-31 NOTE — Patient Instructions (Addendum)
Go ahead and start taking the amlodipine 5 mg daily instead of " as needed" Continue the atenolol and the lisinopril .  All can be taken in the morning ,  The atenolol must be taken twice daily  Your chest CT showed some spots on your lung and on your thyroid  We will start the evaluation with an ultrasound of your thyroid .  If there is a nodule on it,  We will need to get it biopsied.

## 2013-08-31 NOTE — Progress Notes (Signed)
Patient ID: Tiffany Grimes, female   DOB: 1929-06-27, 80 y.o.   MRN: [id number]  Patient Active Problem List   Diagnosis Date Noted  . Pulmonary nodules/lesions, multiple 08/31/2013  . PAD (peripheral artery disease) 08/08/2013  . External hemorrhoid 08/02/2013  . Anal fissure 08/02/2013  . Pain in inferior right lower extremity 05/21/2013  . Ecchymosis 05/10/2013  . Anxiety state, unspecified 10/28/2012  . Routine general medical examination at a health care facility 06/15/2012  . 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 Osteoporosis, post-menopausal 02/11/2012  . Lumbago with sciatica of right side 02/11/2012  . Posterior neck pain 02/11/2012  . Other and unspecified hyperlipidemia 12/16/2011  . Hx of thyroid nodule   . Atrial fibrillation, new onset 08/02/2011  . Chest pain, atypical 08/02/2011  . TIA (transient ischemic attack)   . Barrett esophagus   . Hypertension 03/29/2011    Subjective:  CC:   Chief Complaint  Patient presents with  . Follow-up    From ER visit for BP    HPI:   Tiffany Grimes is a 80 y.o. female who presents for  ER visit on Saturday for chest pressure which woke her  sleep.  Had  chest CT which ruled out  PE and aneurysm but found multiple subcentimeter.   pulmonary nodules and thyroid nodules.  She is a lifelong nonsmoker, but father and husband were both smokers.   no occupational exposure to solvents or particulate matter.  Has a history of thyroid nodule with prior biopsy done remotely  (over 10 years ago ) Careers adviser unknown.  HTN: patient was also given a patch to wear for blood pressure  "for a week"  but not sure of the name. NO rx given.   per RN  It was clonidine patch.    Sees Arnoldo Hooker next Friday for cardiac evaluation    Past Medical History  Diagnosis Date  . Thyroid disease   . TIA (transient ischemic attack) June 2012  . TIA (transient ischemic attack) June 2012  . Barrett esophagus     due for EGD July 2012, [place]  .  Reflux   . Hx of thyroid nodule July 2012    benign biopsy   . Lentigo     Past Surgical History  Procedure Laterality Date  . Thyroid surgery  june 18    nodule removed  . Hernia repair  2008    left inguinal       The following portions of the patient's history were reviewed and updated as appropriate: Allergies, current medications, and problem list.    Review of Systems:   Patient denies headache, fevers, malaise, unintentional weight loss, skin rash, eye pain, sinus congestion and sinus pain, sore throat, dysphagia,  hemoptysis , cough, dyspnea, wheezing, chest pain, palpitations, orthopnea, edema, abdominal pain, nausea, melena, diarrhea, constipation, flank pain, dysuria, hematuria, urinary  Frequency, nocturia, numbness, tingling, seizures,  Focal weakness, Loss of consciousness,  Tremor, insomnia, depression, anxiety, and suicidal ideation.     History   Social History  . Marital Status: Married    Spouse Name: N/A    Number of Children: N/A  . Years of Education: N/A   Occupational History  . Not on file.   Social History Main Topics  . Smoking status: Never Smoker   . Smokeless tobacco: Never Used  . Alcohol Use: No  . Drug Use: No  . Sexual Activity: Not on file   Other Topics Concern  . Not on  file   Social History Narrative  . No narrative on file    Objective:  Filed Vitals:   08/31/13 1356  BP: 146/82  Pulse: 82  Temp: 97.4 F (36.3 C)  Resp: 16     General appearance: alert, cooperative and appears stated age Ears: normal TM's and external ear canals both ears Throat: lips, mucosa, and tongue normal; teeth and gums normal Neck: no adenopathy, no carotid bruit, supple, symmetrical, trachea midline and thyroid not enlarged, symmetric, no tenderness/mass/nodules Back: symmetric, no curvature. ROM normal. No CVA tenderness. Lungs: clear to auscultation bilaterally Heart: regular rate and rhythm, S1, S2 normal, no murmur, click, rub or  gallop Abdomen: soft, non-tender; bowel sounds normal; no masses,  no organomegaly Pulses: 2+ and symmetric Skin: Skin color, texture, turgor normal. No rashes or lesions Lymph nodes: Cervical, supraclavicular, and axillary nodes normal.  Assessment and Plan:  Hypertension Uncontrolled.  Adding amlodipine 5 mg daily  Hx of thyroid nodule Needs thyroid ultrasound evaluation for recurrent pulmonary nodules.   Chest pain, atypical Recent episode and ER evaluation ruled out AMI.  Follow up with Dr Gwen PoundsKowalski next wee,  Pulmonary nodules/lesions, multiple Concern about thyroid CA give thyroid nodules on CT.  Ultrasound ordered.   Updated Medication List Outpatient Encounter Prescriptions as of 08/31/2013  Medication Sig  . amLODipine (NORVASC) 5 MG tablet Take 1 tablet (5 mg total) by mouth daily. As needed for bp > 160  . aspirin 325 MG tablet Take 325 mg by mouth daily.  Marland Kitchen. atenolol (TENORMIN) 25 MG tablet TAKE 1 TABLET TWICE A DAY  . atorvastatin (LIPITOR) 20 MG tablet TAKE 1 TABLET DAILY  . brimonidine (ALPHAGAN P) 0.1 % SOLN Place 1 drop into both eyes 2 (two) times daily.    . fluticasone (FLONASE) 50 MCG/ACT nasal spray 2 sprays into each nostril daily  . hydrocortisone (PROCTOSOL HC) 2.5 % rectal cream Place 1 application rectally 2 (two) times daily.  Marland Kitchen. latanoprost (XALATAN) 0.005 % ophthalmic solution Place 1 drop into both eyes 2 (two) times daily.    Marland Kitchen. levothyroxine (SYNTHROID, LEVOTHROID) 50 MCG tablet TAKE 1 TABLET DAILY  . lisinopril (PRINIVIL,ZESTRIL) 20 MG tablet TAKE 1 TABLET BY MOUTH EVERY DAY  . NEXIUM 40 MG capsule TAKE 1 CAPSULE DAILY BEFORE BREAKFAST  . polyethylene glycol powder (GLYCOLAX/MIRALAX) powder Take 17 g by mouth daily.  . Rivaroxaban (XARELTO) 15 MG TABS tablet Take 1 tablet (15 mg total) by mouth daily.  . sucralfate (CARAFATE) 1 G tablet TAKE 1 TABLET (1 GM TOTAL) TWICE A DAY     Orders Placed This Encounter  Procedures  . US Soft Tissue  Head/Neck  . TSH    Return in about 2 weeks (around 09/14/2013).

## 2013-08-31 NOTE — Telephone Encounter (Signed)
[name] is not new patient she is acute for neck stiffness not able to turn head, in pain. She is patterson slot, please advise if you would like changed, or advise as to [name]. I apologize with neck stiffness and pain I used slot.

## 2013-08-31 NOTE — Telephone Encounter (Signed)
She should be put in the 2:15 slot opened up by [name] today and the new patient Tiffany Grimes needs to be moved.

## 2013-08-31 NOTE — Assessment & Plan Note (Signed)
Uncontrolled.  Adding amlodipine 5 mg daily

## 2013-08-31 NOTE — Telephone Encounter (Signed)
Ct scan findings noted.  Make patient appt with me to discuss,  Regarding the blood pressure, at last visit she was given rx for amlodipie 5 mg to take as needed,  Have her start taking it daily and dc the clonidine patch

## 2013-08-31 NOTE — Telephone Encounter (Signed)
Quick question before calling patient I had patient schedule an appointment with you while here for nurse visit 09/14/13. But I also noticed patient has an appointment with Raquel on 09/06/13 does patient need to keep both appointments?

## 2013-08-31 NOTE — Assessment & Plan Note (Signed)
Needs thyroid ultrasound evaluation for recurrent pulmonary nodules.

## 2013-09-02 ENCOUNTER — Ambulatory Visit: Payer: Self-pay | Admitting: Adult Health

## 2013-09-03 ENCOUNTER — Other Ambulatory Visit: Payer: Self-pay | Admitting: Internal Medicine

## 2013-09-06 ENCOUNTER — Ambulatory Visit: Payer: Self-pay | Admitting: Adult Health

## 2013-09-06 ENCOUNTER — Ambulatory Visit: Payer: Self-pay | Admitting: Internal Medicine

## 2013-09-07 ENCOUNTER — Telehealth: Payer: Self-pay | Admitting: Internal Medicine

## 2013-09-07 DIAGNOSIS — Z8639 Personal history of other endocrine, nutritional and metabolic disease: Secondary | ICD-10-CM

## 2013-09-07 NOTE — Telephone Encounter (Signed)
Thyroid ultrasound showed several tiny nodules that do not meet criteria for needing biopsy,  Will repeat ultrasound in one year per recommendations from radiology

## 2013-09-08 NOTE — Telephone Encounter (Signed)
Left message for patient to call office.  

## 2013-09-08 NOTE — Telephone Encounter (Signed)
Patient returned your call.

## 2013-09-09 ENCOUNTER — Other Ambulatory Visit: Payer: Self-pay | Admitting: *Deleted

## 2013-09-10 NOTE — Telephone Encounter (Signed)
Patient notified as requested. 

## 2013-09-14 ENCOUNTER — Ambulatory Visit: Payer: Self-pay | Admitting: Internal Medicine

## 2013-09-16 ENCOUNTER — Ambulatory Visit: Payer: Self-pay | Admitting: Internal Medicine

## 2013-09-21 ENCOUNTER — Telehealth: Payer: Self-pay | Admitting: Internal Medicine

## 2013-09-21 NOTE — Telephone Encounter (Signed)
Dr. Gwen PoundsKowalski D/C Lisinopril and D/C aspirin: patient is wanting to know if you agree with his decision. Patient has an appointment to discuss on 3/16 please advise.

## 2013-09-21 NOTE — Telephone Encounter (Signed)
Pt called to see if she can move appt sooner than 3/16.  No 30 min available sooner than 3/16 at this time.  States she saw Dr. Gwen Pounds on Friday 2/27 and he made some medication changes.  States she is continuing on the meds Dr. Darrick Huntsman has prescribed as well.  Feels that she needs to talk with Dr. Darrick Huntsman about this.

## 2013-09-22 NOTE — Telephone Encounter (Signed)
Pt notified and verbalized understanding.

## 2013-09-22 NOTE — Telephone Encounter (Signed)
Dr Donna ChristenKowalksi's note has not been received yet.but I trust him so if he doesn't think you need the lisinopril or the aspirin that is ok with me. ,

## 2013-10-04 ENCOUNTER — Ambulatory Visit (INDEPENDENT_AMBULATORY_CARE_PROVIDER_SITE_OTHER): Payer: 59 | Admitting: Internal Medicine

## 2013-10-04 ENCOUNTER — Encounter: Payer: Self-pay | Admitting: Internal Medicine

## 2013-10-04 VITALS — BP 122/76 | HR 74 | Temp 97.9°F | Resp 18 | Wt 122.2 lb

## 2013-10-04 DIAGNOSIS — I4891 Unspecified atrial fibrillation: Secondary | ICD-10-CM

## 2013-10-04 DIAGNOSIS — R918 Other nonspecific abnormal finding of lung field: Secondary | ICD-10-CM

## 2013-10-04 DIAGNOSIS — Z23 Encounter for immunization: Secondary | ICD-10-CM

## 2013-10-04 DIAGNOSIS — M810 Age-related osteoporosis without current pathological fracture: Secondary | ICD-10-CM

## 2013-10-04 MED ORDER — TETANUS-DIPHTH-ACELL PERTUSSIS 5-2.5-18.5 LF-MCG/0.5 IM SUSP: 0.5000 mL | Freq: Once | INTRAMUSCULAR | Status: DC

## 2013-10-04 NOTE — Patient Instructions (Addendum)
Please ask Dr Gwen PoundsKowalski to clarify what medications he wants  You on.    You do not need to resume lisinopril because your blood pressure is perfect on amlodipine and atenolol.  I am setting you up with Dr Kendrick FriesMcQuaid, our pulmonologist, to decide what to do about the pulmlnary nodules that were seen on the CT   We will ask your insurance about Prolia,  The twice annual injection to strenghthen your bone from osteoporosis   Osteoporosis Throughout your life, your body breaks down old bone and replaces it with new bone. As you get older, your body does not replace bone as quickly as it breaks it down. By the age of 30 years, most people begin to gradually lose bone because of the imbalance between bone loss and replacement. Some people lose more bone than others. Bone loss beyond a specified normal degree is considered osteoporosis.  Osteoporosis affects the strength and durability of your bones. The inside of the ends of your bones and your flat bones, like the bones of your pelvis, look like honeycomb, filled with tiny open spaces. As bone loss occurs, your bones become less dense. This means that the open spaces inside your bones become bigger and the walls between these spaces become thinner. This makes your bones weaker. Bones of a person with osteoporosis can become so weak that they can break (fracture) during minor accidents, such as a simple fall. CAUSES  The following factors have been associated with the development of osteoporosis:  Smoking.  Drinking more than 2 alcoholic drinks several days per week.  Long-term use of certain medicines:  Corticosteroids.  Chemotherapy medicines.  Thyroid medicines.  Antiepileptic medicines.  Gonadal hormone suppression medicine.  Immunosuppression medicine.  Being underweight.  Lack of physical activity.  Lack of exposure to the sun. This can lead to vitamin D deficiency.  Certain medical conditions:  Certain inflammatory bowel  diseases, such as Crohn disease and ulcerative colitis.  Diabetes.  Hyperthyroidism.  Hyperparathyroidism. RISK FACTORS Anyone can develop osteoporosis. However, the following factors can increase your risk of developing osteoporosis:  Gender Women are at higher risk than men.  Age Being older than 50 years increases your risk.  Ethnicity White and Asian people have an increased risk.  Weight Being extremely underweight can increase your risk of osteoporosis.  Family history of osteoporosis Having a family member who has developed osteoporosis can increase your risk. SYMPTOMS  Usually, people with osteoporosis have no symptoms.  DIAGNOSIS  Signs during a physical exam that may prompt your caregiver to suspect osteoporosis include:  Decreased height. This is usually caused by the compression of the bones that form your spine (vertebrae) because they have weakened and become fractured.  A curving or rounding of the upper back (kyphosis). To confirm signs of osteoporosis, your caregiver may request a procedure that uses 2 low-dose X-ray beams with different levels of energy to measure your bone mineral density (dual-energy X-ray absorptiometry [DXA]). Also, your caregiver may check your level of vitamin D. TREATMENT  The goal of osteoporosis treatment is to strengthen bones in order to decrease the risk of bone fractures. There are different types of medicines available to help achieve this goal. Some of these medicines work by slowing the processes of bone loss. Some medicines work by increasing bone density. Treatment also involves making sure that your levels of calcium and vitamin D are adequate. PREVENTION  There are things you can do to help prevent osteoporosis. Adequate intake of calcium  and vitamin D can help you achieve optimal bone mineral density. Regular exercise can also help, especially resistance and weight-bearing activities. If you smoke, quitting smoking is an important  part of osteoporosis prevention. MAKE SURE YOU:  Understand these instructions.  Will watch your condition.  Will get help right away if you are not doing well or get worse. FOR MORE INFORMATION www.osteo.org and [employer] Document Released: 04/17/2005 Document Revised: 11/02/2012 Document Reviewed: 06/22/2011 [facility] Patient Information 2014 [place], [place].

## 2013-10-04 NOTE — Progress Notes (Signed)
Pre-visit discussion using our clinic review tool. No additional management support is needed unless otherwise documented below in the visit note.  

## 2013-10-05 ENCOUNTER — Encounter: Payer: Self-pay | Admitting: Internal Medicine

## 2013-10-05 DIAGNOSIS — R918 Other nonspecific abnormal finding of lung field: Secondary | ICD-10-CM | POA: Insufficient documentation

## 2013-10-05 NOTE — Assessment & Plan Note (Signed)
Severe, with no history of fractures.  Discussed and recommended Prolia.

## 2013-10-05 NOTE — Assessment & Plan Note (Signed)
Rate controlled.  On xarelto for anticoagulation

## 2013-10-05 NOTE — Progress Notes (Signed)
Patient ID: Tiffany Grimes, female   DOB: [birth year], 80 y.o.   MRN: [id number]  Patient Active Problem List   Diagnosis Date Noted  . Multiple pulmonary nodules 10/05/2013  . Pulmonary nodules/lesions, multiple 08/31/2013  . PAD (peripheral artery disease) 08/08/2013  . External hemorrhoid 08/02/2013  . Anal fissure 08/02/2013  . Pain in inferior right lower extremity 05/21/2013  . Ecchymosis 05/10/2013  . Anxiety state, unspecified 10/28/2012  . Routine general medical examination at a health care facility 06/15/2012  . 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 Osteoporosis, post-menopausal 02/11/2012  . Lumbago with sciatica of right side 02/11/2012  . Posterior neck pain 02/11/2012  . Other and unspecified hyperlipidemia 12/16/2011  . Hx of thyroid nodule   . Atrial fibrillation, new onset 08/02/2011  . Chest pain, atypical 08/02/2011  . TIA (transient ischemic attack)   . Barrett esophagus   . Hypertension 03/29/2011    Subjective:  CC:   Chief Complaint  Patient presents with  . Follow-up    HPI:   Tiffany Grimes is a 80 y.o. female who presents for  Follow up on recent incidental discovery of thyroid nodules and pulmonary nodules during recent admission for rapid atrial fibrillation presenting with chest pain.   Thyroid ultrasound showed several tiny nodules to small to biopsy.  One year follow up ultrasound recommended.   Pulmonary nodules on CT have not been worked up.  Gwen Pounds stopped her lisinopril because "he was concerned about my bleedin grisk."  Still taking Xarelto and aspirin.  .    Past Medical History  Diagnosis Date  . Thyroid disease   . TIA (transient ischemic attack) June 2012  . TIA (transient ischemic attack) June 2012  . Barrett esophagus     due for EGD July 2012, [place]  . Reflux   . Hx of thyroid nodule July 2012    benign biopsy   . Lentigo     Past Surgical History  Procedure Laterality Date  . Thyroid surgery  june 18   nodule removed  . Hernia repair  2008    left inguinal       The following portions of the patient's history were reviewed and updated as appropriate: Allergies, current medications, and problem list.    Review of Systems:   Patient denies headache, fevers, malaise, unintentional weight loss, skin rash, eye pain, sinus congestion and sinus pain, sore throat, dysphagia,  hemoptysis , cough, dyspnea, wheezing, chest pain, palpitations, orthopnea, edema, abdominal pain, nausea, melena, diarrhea, constipation, flank pain, dysuria, hematuria, urinary  Frequency, nocturia, numbness, tingling, seizures,  Focal weakness, Loss of consciousness,  Tremor, insomnia, depression, anxiety, and suicidal ideation.     History   Social History  . Marital Status: Married    Spouse Name: N/A    Number of Children: N/A  . Years of Education: N/A   Occupational History  . Not on file.   Social History Main Topics  . Smoking status: Never Smoker   . Smokeless tobacco: Never Used  . Alcohol Use: No  . Drug Use: No  . Sexual Activity: Not on file   Other Topics Concern  . Not on file   Social History Narrative  . No narrative on file    Objective:  Filed Vitals:   10/04/13 1412  BP: 122/76  Pulse: 74  Temp: 97.9 F (36.6 C)  Resp: 18     General appearance: alert, cooperative and appears stated age Ears: normal TM's and external  ear canals both ears Throat: lips, mucosa, and tongue normal; teeth and gums normal Neck: no adenopathy, no carotid bruit, supple, symmetrical, trachea midline and thyroid not enlarged, symmetric, no tenderness/mass/nodules Back: symmetric, no curvature. ROM normal. No CVA tenderness. Lungs: clear to auscultation bilaterally Heart: regular rate and rhythm, S1, S2 normal, no murmur, click, rub or gallop Abdomen: soft, non-tender; bowel sounds normal; no masses,  no organomegaly Pulses: 2+ and symmetric Skin: Skin color, texture, turgor normal. No rashes  or lesions Lymph nodes: Cervical, supraclavicular, and axillary nodes normal.  Assessment and Plan:  Atrial fibrillation, new onset Rate controlled.  On xarelto for anticoagulation  Multiple pulmonary nodules Etiology unclear,  Referral to Dr Kendrick FriesMcQuaid for evaluation  Osteoporosis, post-menopausal Severe, with no history of fractures.  Discussed and recommended Prolia.      Updated Medication List Outpatient Encounter Prescriptions as of 10/04/2013  Medication Sig  . amLODipine (NORVASC) 5 MG tablet Take 1 tablet (5 mg total) by mouth daily. As needed for bp > 160  . atenolol (TENORMIN) 25 MG tablet TAKE 1 TABLET TWICE A DAY  . atorvastatin (LIPITOR) 20 MG tablet TAKE 1 TABLET DAILY  . brimonidine (ALPHAGAN P) 0.1 % SOLN Place 1 drop into both eyes 2 (two) times daily.    . fluticasone (FLONASE) 50 MCG/ACT nasal spray 2 sprays into each nostril daily  . hydrocortisone (PROCTOSOL HC) 2.5 % rectal cream Place 1 application rectally 2 (two) times daily.  Marland Kitchen. latanoprost (XALATAN) 0.005 % ophthalmic solution Place 1 drop into both eyes 2 (two) times daily.    Marland Kitchen. levothyroxine (SYNTHROID, LEVOTHROID) 50 MCG tablet TAKE 1 TABLET DAILY  . NEXIUM 40 MG capsule TAKE 1 CAPSULE DAILY BEFORE BREAKFAST  . polyethylene glycol powder (GLYCOLAX/MIRALAX) powder Take 17 g by mouth daily.  . Rivaroxaban (XARELTO) 15 MG TABS tablet Take 1 tablet (15 mg total) by mouth daily.  . sucralfate (CARAFATE) 1 G tablet TAKE 1 TABLET (1 GM TOTAL) TWICE A DAY  . aspirin 325 MG tablet Take 325 mg by mouth daily.  Marland Kitchen. lisinopril (PRINIVIL,ZESTRIL) 20 MG tablet TAKE 1 TABLET BY MOUTH EVERY DAY  . Tdap (BOOSTRIX) 5-2.5-18.5 LF-MCG/0.5 injection Inject 0.5 mLs into the muscle once.     Orders Placed This Encounter  Procedures  . Pneumococcal conjugate vaccine 13-valent    No Follow-up on file.

## 2013-10-05 NOTE — Assessment & Plan Note (Signed)
Etiology unclear,  Referral to Dr Kendrick Fries for evaluation

## 2013-11-15 ENCOUNTER — Telehealth: Payer: Self-pay | Admitting: Internal Medicine

## 2013-11-15 MED ORDER — FLUTICASONE PROPIONATE 50 MCG/ACT NA SUSP: NASAL | Status: DC

## 2013-11-15 NOTE — Telephone Encounter (Signed)
Pt left vm.  Needs refill on nasal spray.  [facility] [address]

## 2013-11-15 NOTE — Telephone Encounter (Signed)
Refill sent as requested. 

## 2013-11-30 ENCOUNTER — Other Ambulatory Visit: Payer: Self-pay | Admitting: Internal Medicine

## 2013-12-18 ENCOUNTER — Emergency Department: Payer: Self-pay | Admitting: Emergency Medicine

## 2013-12-18 LAB — COMPREHENSIVE METABOLIC PANEL
ALBUMIN: 3.9 g/dL (ref 3.4–5.0)
ALK PHOS: 72 U/L
ANION GAP: 9 (ref 7–16)
BILIRUBIN TOTAL: 0.7 mg/dL (ref 0.2–1.0)
BUN: 17 mg/dL (ref 7–18)
CO2: 27 mmol/L (ref 21–32)
Calcium, Total: 8.7 mg/dL (ref 8.5–10.1)
Chloride: 103 mmol/L (ref 98–107)
Creatinine: 0.66 mg/dL (ref 0.60–1.30)
EGFR (Non-African Amer.): >60
Glucose: 120 mg/dL — ABNORMAL HIGH (ref 65–99)
OSMOLALITY: 280 (ref 275–301)
Potassium: 4.2 mmol/L (ref 3.5–5.1)
SGOT(AST): 28 U/L (ref 15–37)
SGPT (ALT): 27 U/L (ref 12–78)
Sodium: 139 mmol/L (ref 136–145)
Total Protein: 6.5 g/dL (ref 6.4–8.2)

## 2013-12-18 LAB — CBC
HCT: 40 % (ref 35.0–47.0)
HGB: 13.2 g/dL (ref 12.0–16.0)
MCH: 28.7 pg (ref 26.0–34.0)
MCHC: 33.1 g/dL (ref 32.0–36.0)
MCV: 87 fL (ref 80–100)
Platelet: 162 10*3/uL (ref 150–440)
RBC: 4.62 10*6/uL (ref 3.80–5.20)
RDW: 13.6 % (ref 11.5–14.5)
WBC: 5.5 10*3/uL (ref 3.6–11.0)

## 2013-12-18 LAB — PRO B NATRIURETIC PEPTIDE: B-Type Natriuretic Peptide: 1027 pg/mL — ABNORMAL HIGH (ref 0–450)

## 2013-12-18 LAB — PROTIME-INR
INR: 2.5
PROTHROMBIN TIME: 26.2 s — AB (ref 11.5–14.7)

## 2013-12-18 LAB — TROPONIN I: Troponin-I: <0.02 ng/mL

## 2013-12-22 IMAGING — CR DG FEMUR 2+V*R*
4 series · 4 of 4 positions shown · non-contrast
Comparison: None.

CLINICAL DATA: Acute thigh pain, episodic, history of osteoporosis.

RIGHT FEMUR - 2 VIEW

[view not recorded (1 of 4)]
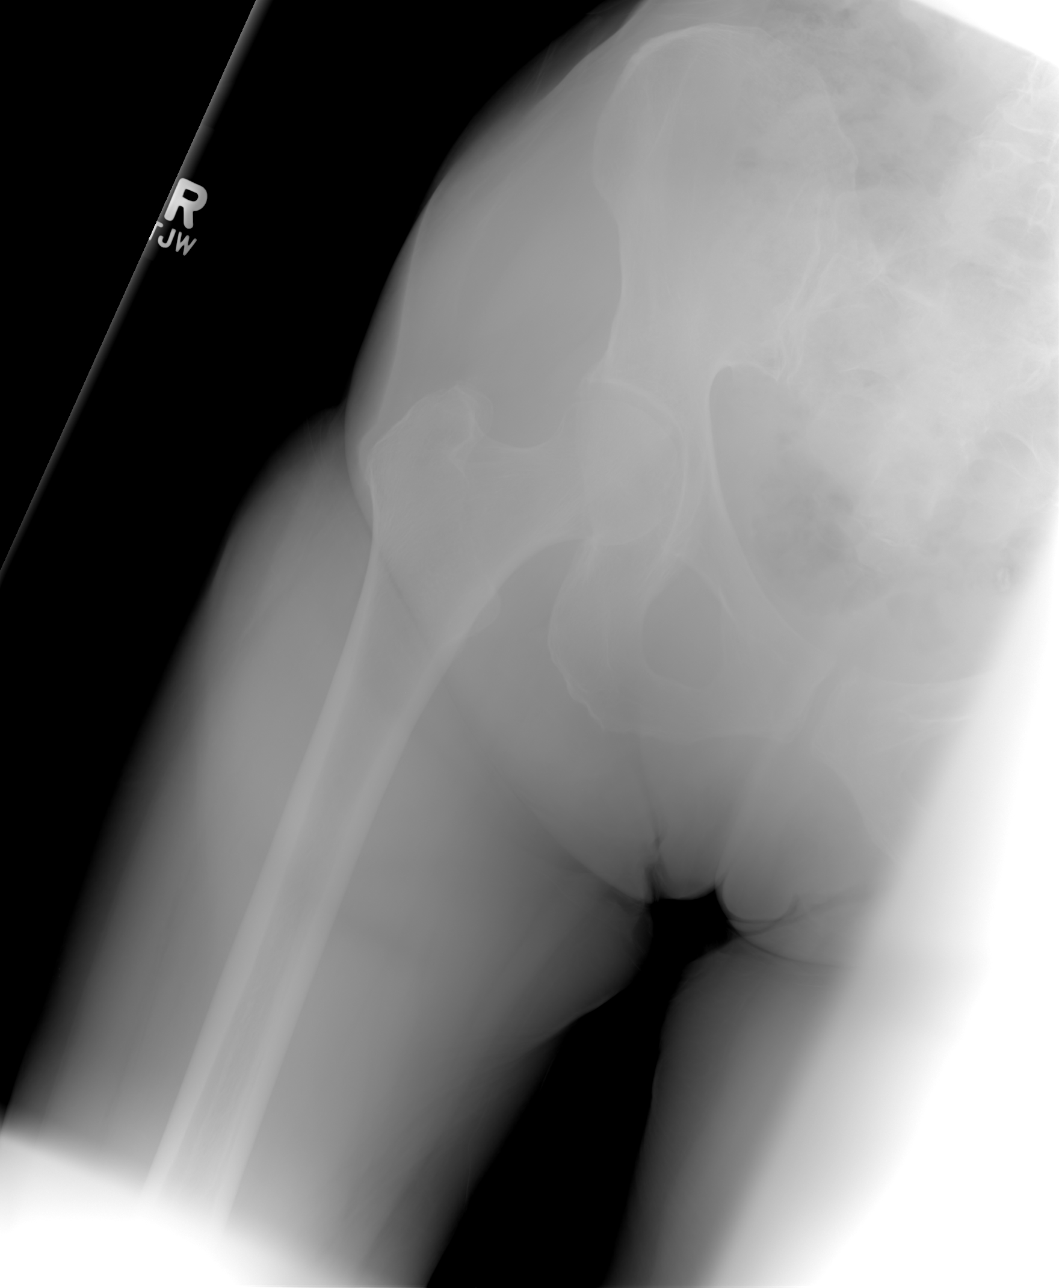

[view not recorded (2 of 4)]
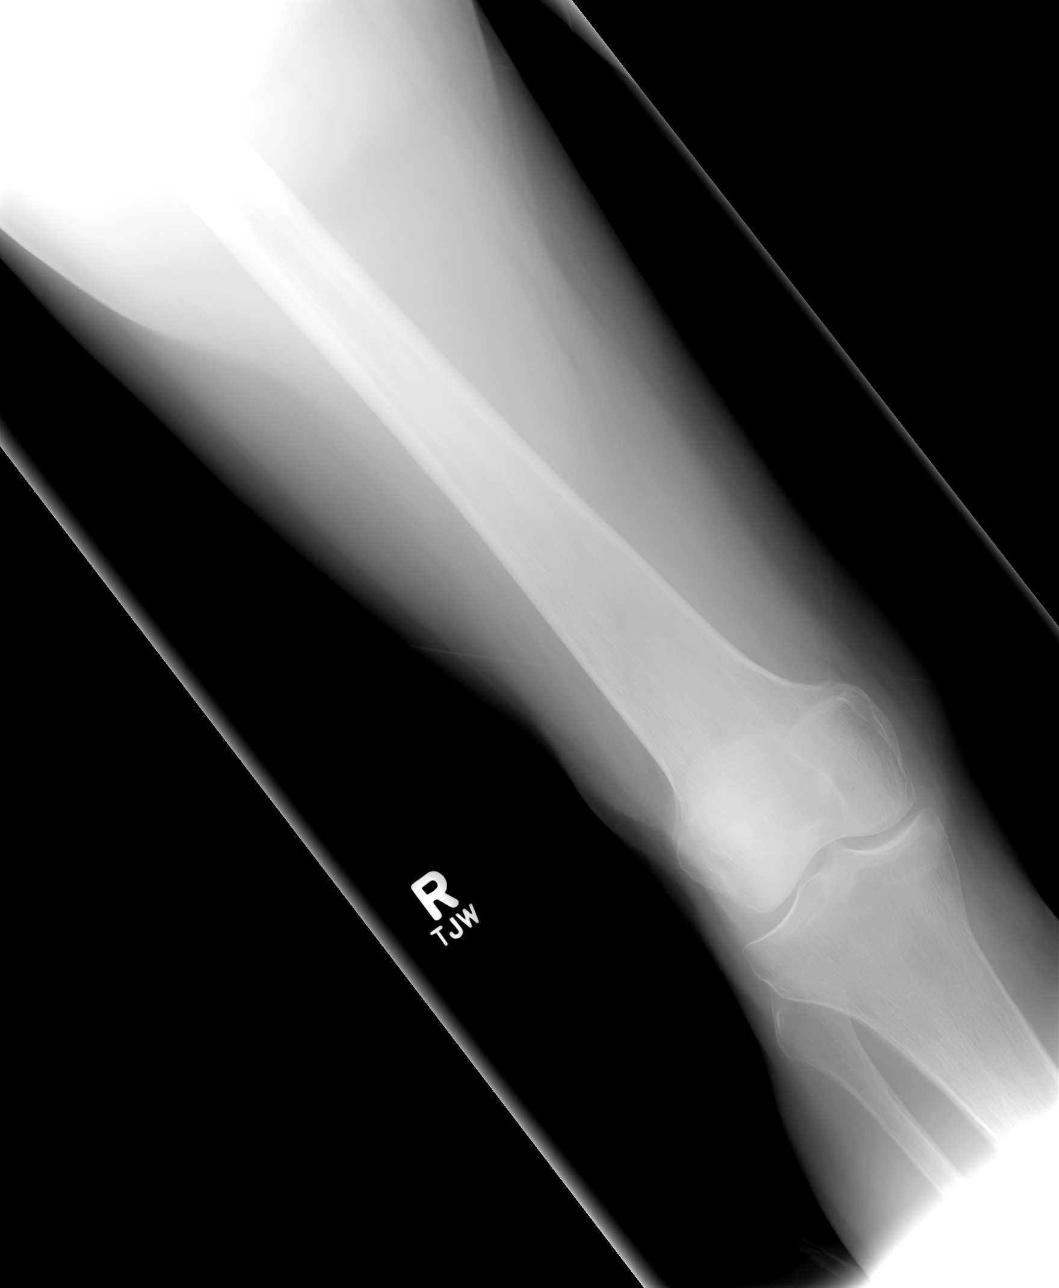

[view not recorded (3 of 4)]
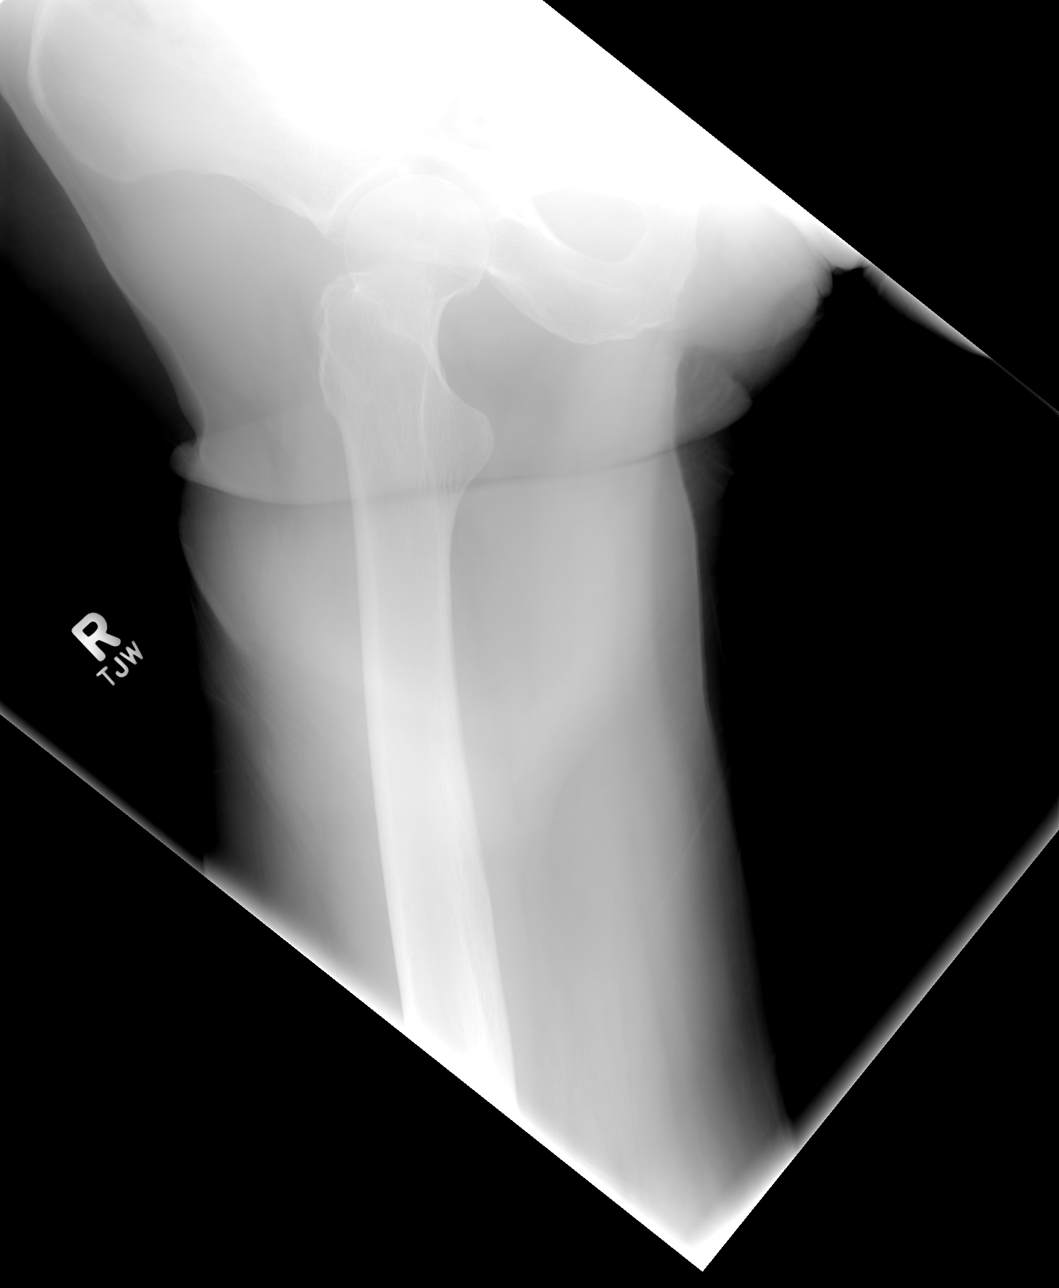

[view not recorded (4 of 4)]
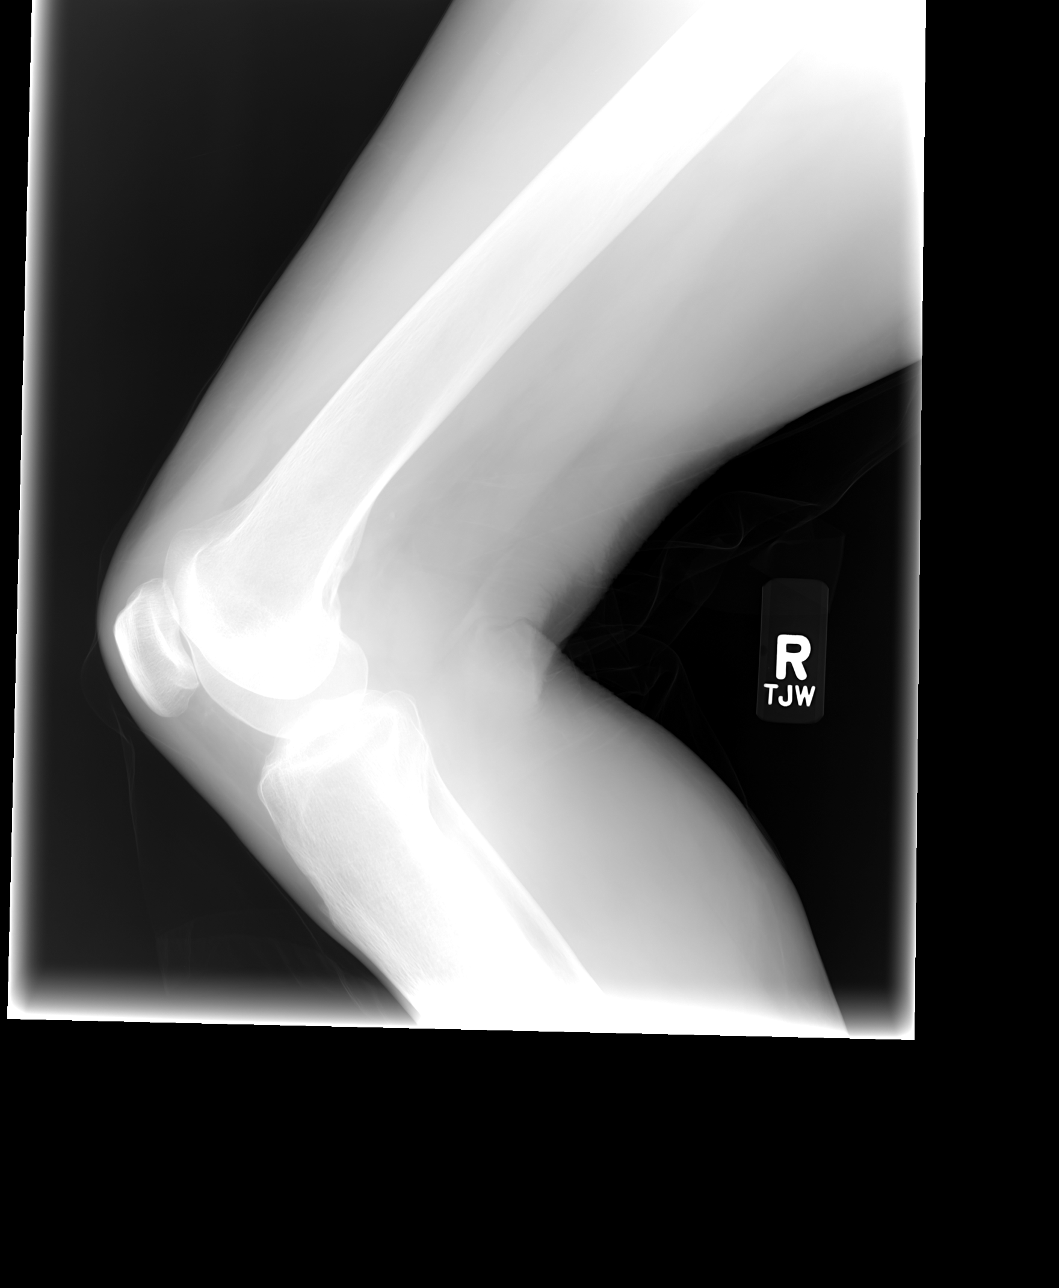

[4 of 4 positions shown; findings below may reference images not displayed]

FINDINGS: There is no evidence of fracture or other focal bone
lesions.  Soft tissues are unremarkable.
IMPRESSION: Negative.

## 2013-12-22 IMAGING — CR DG HIP COMPLETE 2+V*R*
3 series · 3 of 3 positions shown · non-contrast
Comparison: None.

CLINICAL DATA: Right thigh pain.  Hip pain.  Osteoporosis.

RIGHT HIP - COMPLETE 2+ VIEW

[view not recorded (1 of 3)]
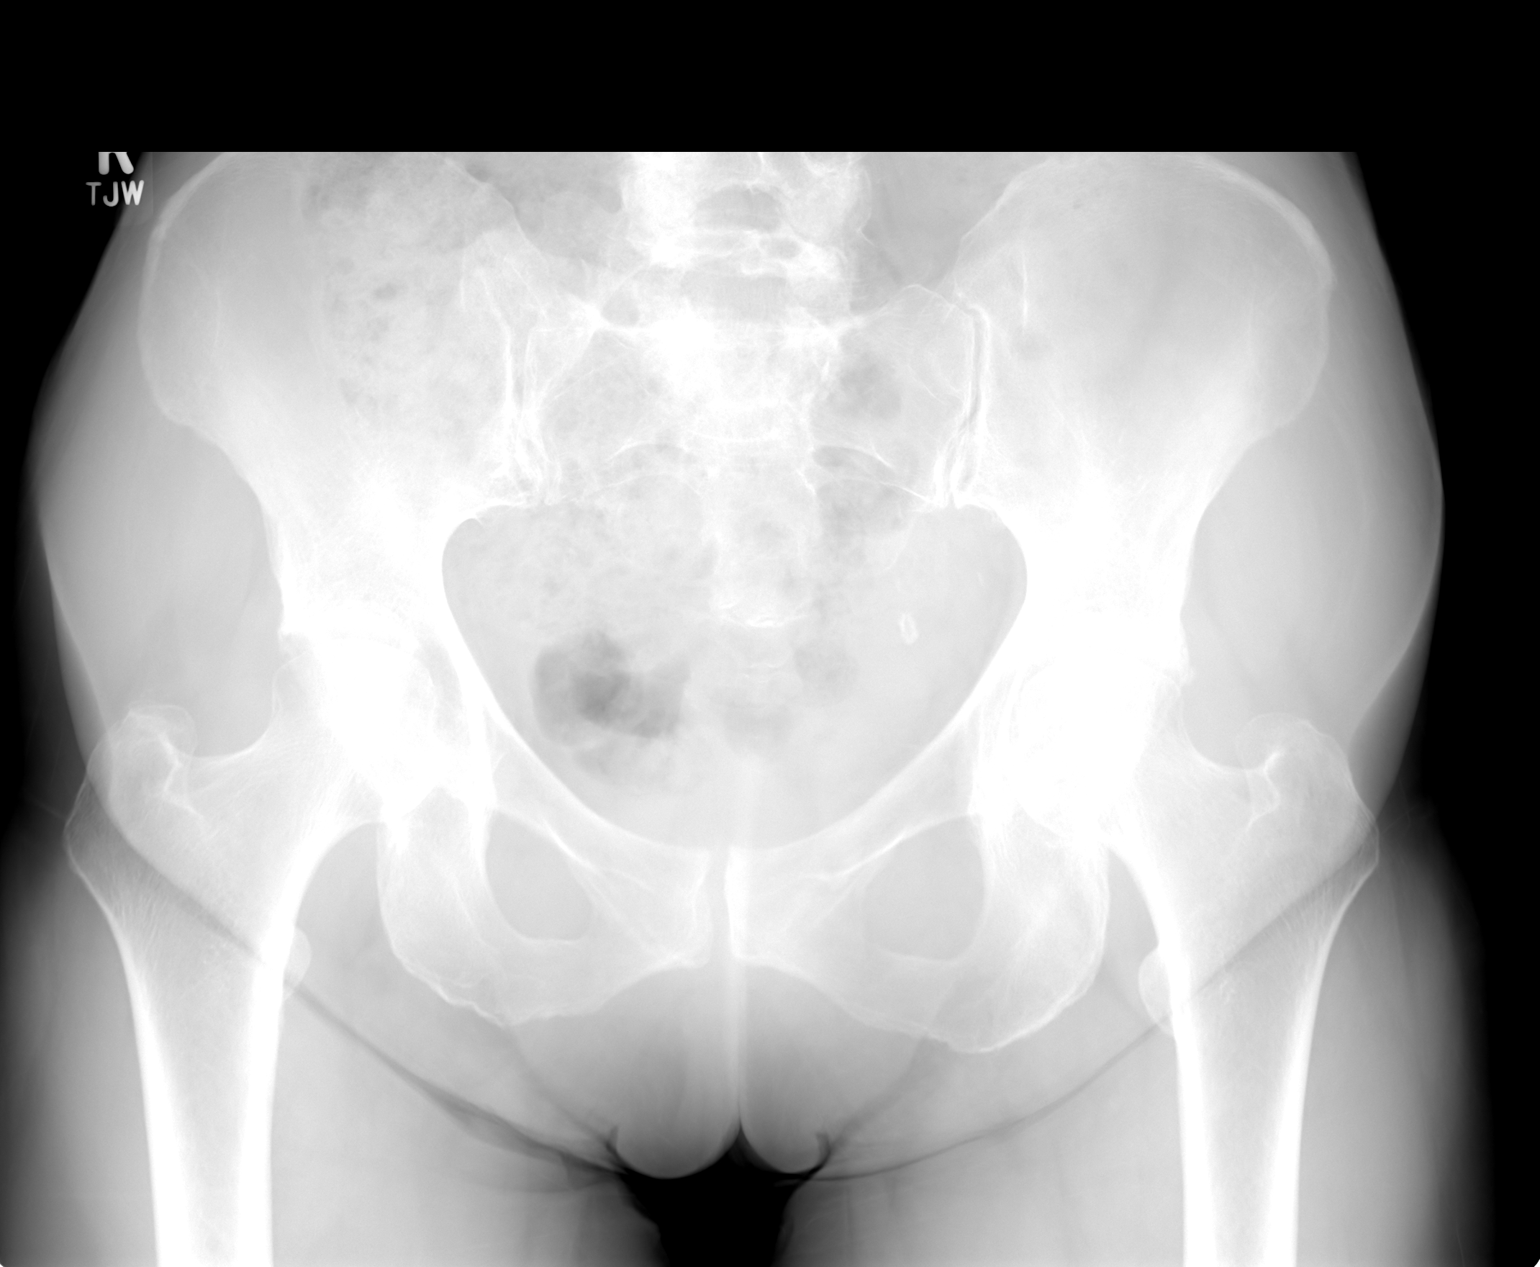

[view not recorded (2 of 3)]
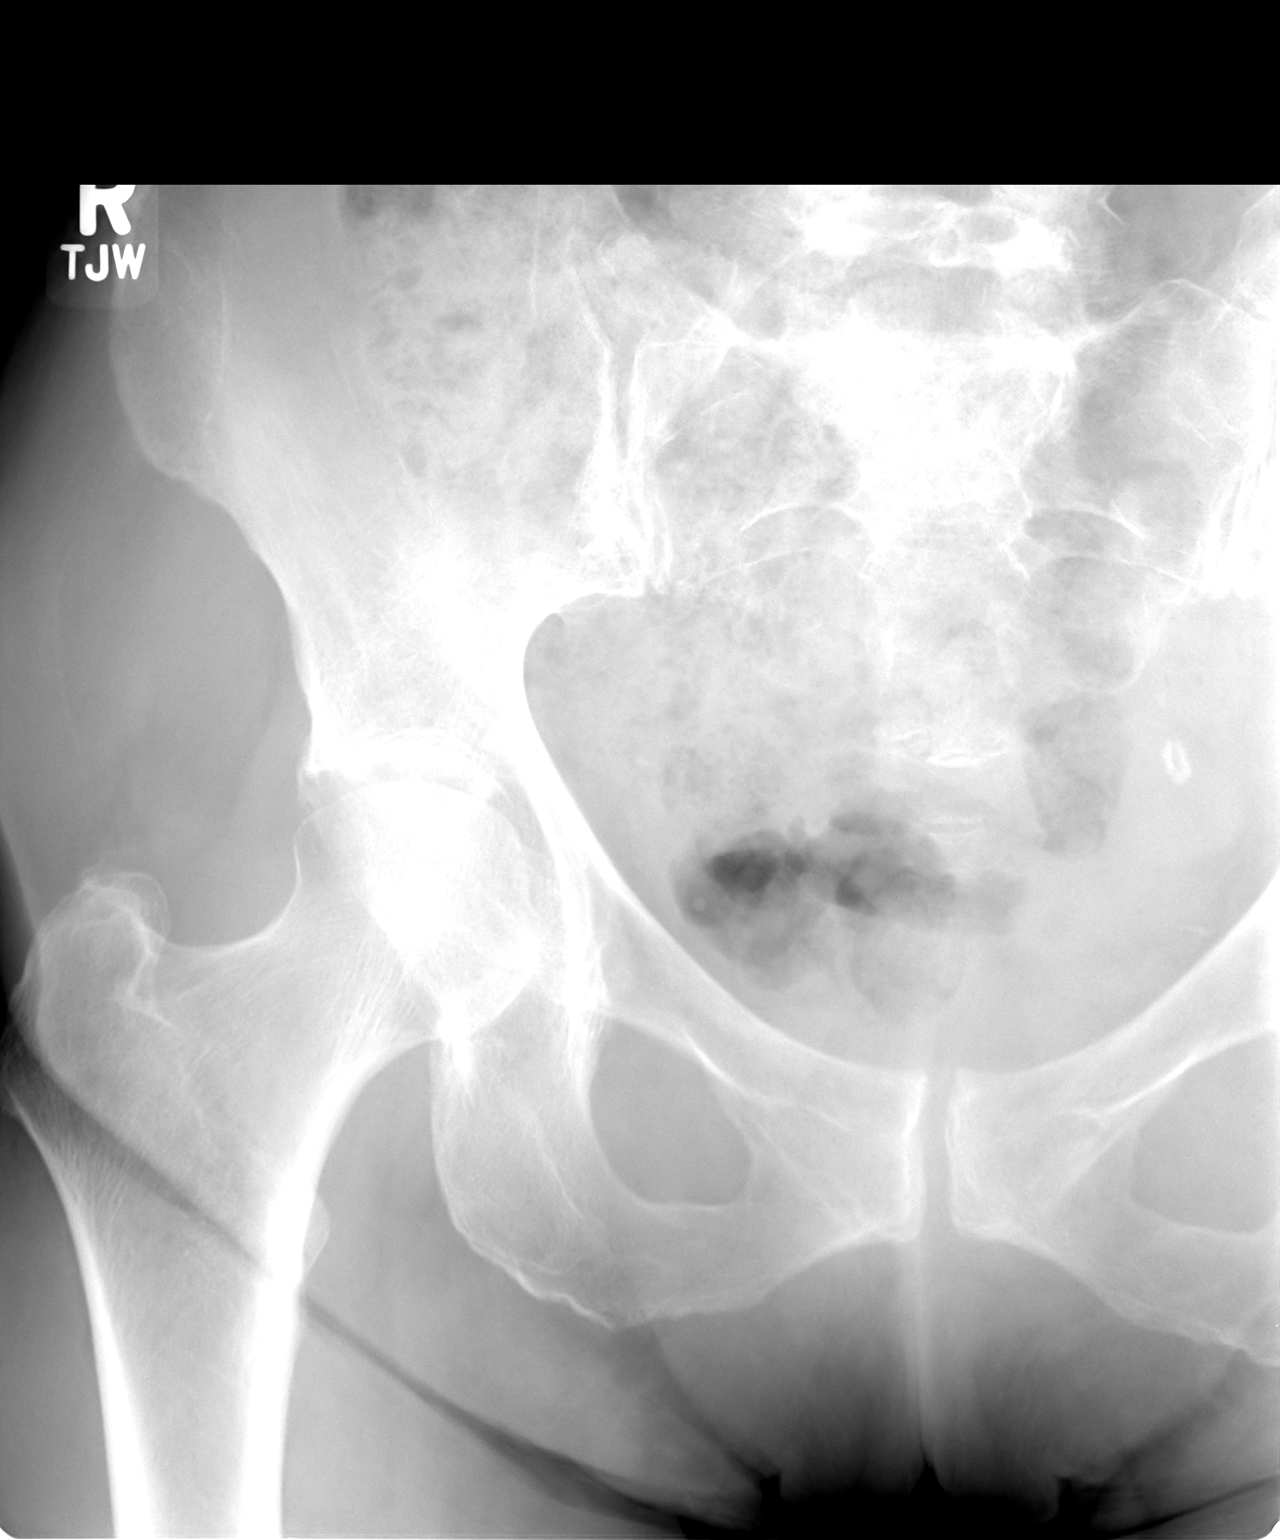

[view not recorded (3 of 3)]
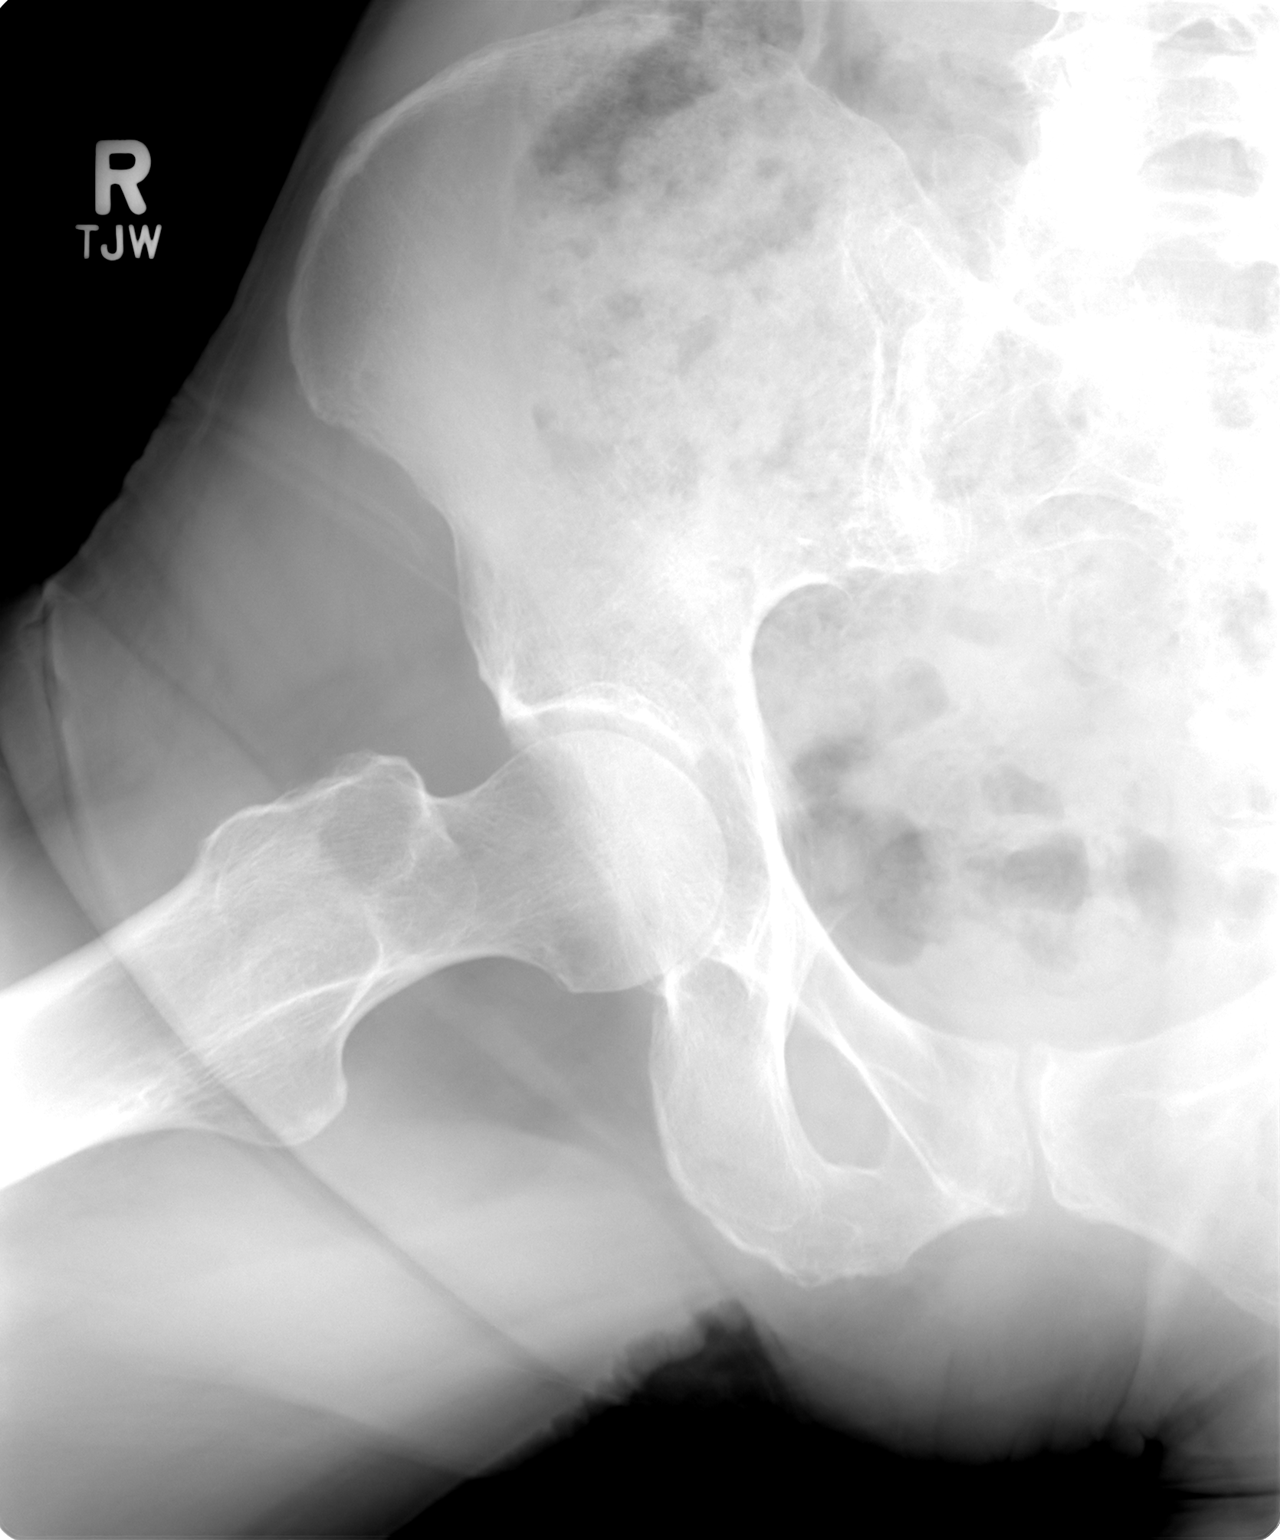

[3 of 3 positions shown; findings below may reference images not displayed]

FINDINGS: There is no evidence of hip fracture or dislocation.
There is no evidence of arthropathy or other focal bone
abnormality.
IMPRESSION: Negative.

## 2013-12-30 ENCOUNTER — Telehealth: Payer: Self-pay | Admitting: Internal Medicine

## 2013-12-30 MED ORDER — LEVOTHYROXINE SODIUM 50 MCG PO TABS: ORAL_TABLET | ORAL | Status: DC

## 2013-12-30 NOTE — Telephone Encounter (Signed)
Pt states she needs refill for thyroid medication.  [facility] [address]

## 2013-12-30 NOTE — Telephone Encounter (Signed)
Rx sent to pharmacy by escript  

## 2014-02-04 ENCOUNTER — Other Ambulatory Visit: Payer: Self-pay | Admitting: Adult Health

## 2014-02-09 ENCOUNTER — Other Ambulatory Visit: Payer: Self-pay | Admitting: Internal Medicine

## 2014-02-28 ENCOUNTER — Telehealth: Payer: Self-pay | Admitting: Internal Medicine

## 2014-02-28 NOTE — Telephone Encounter (Signed)
Pt dropped off a CD of xray that she had on her toe. She was told to have Dr. Darrick Huntsmanullo view the xray and determine if the toe is healing and if another xray is needed.CD in Dr. Melina Schoolsullo's box.msn

## 2014-02-28 NOTE — Telephone Encounter (Signed)
Faxed for results

## 2014-02-28 NOTE — Telephone Encounter (Signed)
Called patient to pick up disc found that x-ray was made at [facility] in Clinic will call for radiology report.

## 2014-03-02 ENCOUNTER — Other Ambulatory Visit: Payer: Self-pay | Admitting: Internal Medicine

## 2014-03-05 ENCOUNTER — Other Ambulatory Visit: Payer: Self-pay | Admitting: Internal Medicine

## 2014-03-18 ENCOUNTER — Telehealth: Payer: Self-pay | Admitting: Internal Medicine

## 2014-03-18 DIAGNOSIS — I10 Essential (primary) hypertension: Secondary | ICD-10-CM

## 2014-03-18 MED ORDER — CLONIDINE HCL 0.1 MG PO TABS: 0.1000 mg | ORAL_TABLET | Freq: Two times a day (BID) | ORAL | Status: DC

## 2014-03-18 NOTE — Telephone Encounter (Signed)
Patient has lost script for blood pressure pill you advised her to take if BP was elevated over 150 patient cannot recall name and I cannot find note please advise.

## 2014-03-18 NOTE — Telephone Encounter (Signed)
Patient notified and voiced understanding.

## 2014-03-18 NOTE — Telephone Encounter (Signed)
She should be taking amlodipine daily.,  The clonidien is "as needed"  I sent it to pharmacy

## 2014-03-24 ENCOUNTER — Emergency Department: Payer: Self-pay | Admitting: Emergency Medicine

## 2014-03-30 ENCOUNTER — Ambulatory Visit (INDEPENDENT_AMBULATORY_CARE_PROVIDER_SITE_OTHER): Payer: 59 | Admitting: Internal Medicine

## 2014-03-30 ENCOUNTER — Encounter: Payer: Self-pay | Admitting: Internal Medicine

## 2014-03-30 VITALS — BP 120/64 | HR 72 | Temp 98.0°F | Resp 12 | Ht 66.0 in | Wt 122.5 lb

## 2014-03-30 DIAGNOSIS — E042 Nontoxic multinodular goiter: Secondary | ICD-10-CM

## 2014-03-30 DIAGNOSIS — J432 Centrilobular emphysema: Secondary | ICD-10-CM

## 2014-03-30 DIAGNOSIS — R918 Other nonspecific abnormal finding of lung field: Secondary | ICD-10-CM

## 2014-03-30 DIAGNOSIS — J438 Other emphysema: Secondary | ICD-10-CM

## 2014-03-30 DIAGNOSIS — Z23 Encounter for immunization: Secondary | ICD-10-CM

## 2014-03-30 DIAGNOSIS — E039 Hypothyroidism, unspecified: Secondary | ICD-10-CM

## 2014-03-30 DIAGNOSIS — I10 Essential (primary) hypertension: Secondary | ICD-10-CM

## 2014-03-30 NOTE — Progress Notes (Signed)
Pre visit review using our clinic review tool, if applicable. No additional management support is needed unless otherwise documented below in the visit note. 

## 2014-03-30 NOTE — Patient Instructions (Addendum)
  Your blood pressure and pulse are fine currently;  Adding more medication on a daily basis will just make you tired.   Please continue atenolol and amlodipine,  Save the clonidine for elevations,  but also rest and relax for an hours  before rechecking .  I do want you to have regular daily exercise     I am referring you to our pulmonologist for evaluation of the chest /pulmonary findings on your previous CT

## 2014-03-30 NOTE — Progress Notes (Signed)
Patient ID: Tiffany Grimes, female   DOB: April 18, 1929, 80 y.o.   MRN: [id number]   Patient Active Problem List   Diagnosis Date Noted  . Multiple pulmonary nodules 10/05/2013  . Pulmonary nodules/lesions, multiple 08/31/2013  . PAD (peripheral artery disease) 08/08/2013  . External hemorrhoid 08/02/2013  . Anal fissure 08/02/2013  . Pain in inferior right lower extremity 05/21/2013  . Ecchymosis 05/10/2013  . Anxiety state, unspecified 10/28/2012  . Routine general medical examination at a health care facility 06/15/2012  . 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 Osteoporosis, post-menopausal 02/11/2012  . Lumbago with sciatica of right side 02/11/2012  . Posterior neck pain 02/11/2012  . Other and unspecified hyperlipidemia 12/16/2011  . Hx of thyroid nodule   . Atrial fibrillation, new onset 08/02/2011  . Chest pain, atypical 08/02/2011  . TIA (transient ischemic attack)   . Barrett esophagus   . Hypertension 03/29/2011    Subjective:  CC:   Chief Complaint  Patient presents with  . Follow-up    ED follow up HTN    HPI:   Tiffany Grimes is a 80 y.o. female who presents for ER follow up for uncontrolled asymptomatic hypertension.  Patient was treated and released by [facility] ER  On Sept 3.  With an initial  BP of 180/90,  rechec 126/84 on recheck without intervention .    Patient reports being in her usual state of health on Thursday, and proceeded  to do some light yardwork.  She felt fine until she bent over to sweep the leaves out from underneath her car, and suddenly felt her heart  do a thump.  She  went inside and checked BP and it was elevated at 155/95 so she took a  Dose of clonidine and went to lie down.  Her husband aggravated her for several minutes with questions while she was trying to rest and when he rechecked her BP just 30  Minutes after the dose of clonidine,  it was 180/100, so she went to ED.  Repeat BP in ER was 126/84, for after an hour of observation she  was released.    She was last seen in March after hospitalization for new onset atrial fibrillation with RVR.  At that time she had an evaluation for chest pain and incidental findings of thyroid and pulmonary nodules and centrilobular emphysema was found on chest CT. [facility]  [facility] pulmonary  Nodules was discussed but never done.  Evaluation of thyroid nodules was done with TSH and ultrasound. Due to the small size annual ultrasound was recommended.  She has no history of active smoking but has had a lifetime of passive exposure  .  No history of respiratory failure.  Exercises occasionally and denies shortness of breath.    Past Medical History  Diagnosis Date  . Thyroid disease   . TIA (transient ischemic attack) June 2012  . TIA (transient ischemic attack) June 2012  . Barrett esophagus     due for EGD July 2012, [place]  . Reflux   . Hx of thyroid nodule July 2012    benign biopsy   . Lentigo     Past Surgical History  Procedure Laterality Date  . Thyroid surgery  june 18    nodule removed  . Hernia repair  2008    left inguinal       The following portions of the patient's history were reviewed and updated as appropriate: Allergies, current medications, and  problem list.    Review of Systems:   Patient denies headache, fevers, malaise, unintentional weight loss, skin rash, eye pain, sinus congestion and sinus pain, sore throat, dysphagia,  hemoptysis , cough, dyspnea, wheezing, chest pain, palpitations, orthopnea, edema, abdominal pain, nausea, melena, diarrhea, constipation, flank pain, dysuria, hematuria, urinary  Frequency, nocturia, numbness, tingling, seizures,  Focal weakness, Loss of consciousness,  Tremor, insomnia, depression, anxiety, and suicidal ideation.     History   Social History  . Marital Status: Married    Spouse Name: N/A    Number of Children: N/A  . Years of Education: N/A   Occupational History  . Not on file.    Social History Main Topics  . Smoking status: Never Smoker   . Smokeless tobacco: Never Used  . Alcohol Use: No  . Drug Use: No  . Sexual Activity: Not on file   Other Topics Concern  . Not on file   Social History Narrative  . No narrative on file    Objective:  Filed Vitals:   03/30/14 1448  BP: 120/64  Pulse: 72  Temp: 98 F (36.7 C)  Resp: 12     General appearance: alert, cooperative and appears stated age Ears: normal TM's and external ear canals both ears Throat: lips, mucosa, and tongue normal; teeth and gums normal Neck: no adenopathy, no carotid bruit, supple, symmetrical, trachea midline and thyroid not enlarged, symmetric, no tenderness/mass/nodules Back: symmetric, no curvature. ROM normal. No CVA tenderness. Lungs: clear to auscultation bilaterally Heart: regular rate and rhythm, S1, S2 normal, no murmur, click, rub or gallop Abdomen: soft, non-tender; bowel sounds normal; no masses,  no organomegaly Pulses: 2+ and symmetric Skin: Skin color, texture, turgor normal. No rashes or lesions Lymph nodes: Cervical, supraclavicular, and axillary nodes normal.  Assessment and Plan:  Pulmonary nodules/lesions, multiple Noted on CT chest done at [facility] in late February during ER evaluation for chest pain.  Fibrotic changes to lower lobes, and centrilobular emphysema was also noted .  She has no history of personal tobacco abuse, so her risk for bronchogenic carcinoma is low.  Alpha antitrypsin has been ordered and referral to Dr. Stevenson Clinch is in process. She has not had pulmonary function tests since she is asymptomatic    Hypertension Labile ,  Aggravated by marital discord and exertion.  Baseline BP is well controlled. Continue prn use of clonidine    Updated Medication List Outpatient Encounter Prescriptions as of 03/30/2014  Medication Sig  . amLODipine (NORVASC) 5 MG tablet TAKE 1 TABLET (5 MG TOTAL) BY MOUTH DAILY. AS NEEDED FOR BP > 160  . atenolol  (TENORMIN) 25 MG tablet TAKE 1 TABLET TWICE A DAY  . atorvastatin (LIPITOR) 20 MG tablet TAKE 1 TABLET DAILY  . brimonidine (ALPHAGAN P) 0.1 % SOLN Place 1 drop into both eyes 2 (two) times daily.    . cloNIDine (CATAPRES) 0.1 MG tablet Take 1 tablet (0.1 mg total) by mouth 2 (two) times daily. As needed for BP > 150  . latanoprost (XALATAN) 0.005 % ophthalmic solution Place 1 drop into both eyes 2 (two) times daily.    Marland Kitchen levothyroxine (SYNTHROID, LEVOTHROID) 50 MCG tablet TAKE 1 TABLET DAILY  . NEXIUM 40 MG capsule TAKE 1 CAPSULE DAILY BEFORE BREAKFAST  . polyethylene glycol powder (GLYCOLAX/MIRALAX) powder TAKE 17 GRAMS BY MOUTH DAILY.  Marland Kitchen sucralfate (CARAFATE) 1 G tablet TAKE 1 TABLET (1 GM TOTAL) TWICE A DAY  . XARELTO 15 MG TABS tablet TAKE 1 TABLET  DAILY  . fluticasone (FLONASE) 50 MCG/ACT nasal spray Place 2 sprays into both nostrils daily as needed. 2 sprays into each nostril daily  . [DISCONTINUED] aspirin 325 MG tablet Take 325 mg by mouth daily.  . [DISCONTINUED] fluticasone (FLONASE) 50 MCG/ACT nasal spray 2 sprays into each nostril daily  . [DISCONTINUED] hydrocortisone (PROCTOSOL HC) 2.5 % rectal cream Place 1 application rectally 2 (two) times daily.  . [DISCONTINUED] lisinopril (PRINIVIL,ZESTRIL) 20 MG tablet TAKE 1 TABLET BY MOUTH EVERY DAY  . [DISCONTINUED] Tdap (BOOSTRIX) 5-2.5-18.5 LF-MCG/0.5 injection Inject 0.5 mLs into the muscle once.     Orders Placed This Encounter  Procedures  . Flu Vaccine QUAD 36+ mos PF IM (Fluarix Quad PF)  . Microalbumin / creatinine urine ratio  . T4 AND TSH  . Comp Met (CMET)  . Alpha-1-Antitrypsin Deficiency  . Ambulatory referral to Pulmonology    Return in about 6 months (around 09/28/2014).

## 2014-03-31 LAB — COMPREHENSIVE METABOLIC PANEL
ALT: 22 U/L (ref 0–35)
AST: 30 U/L (ref 0–37)
Albumin: 4.5 g/dL (ref 3.5–5.2)
Alkaline Phosphatase: 66 U/L (ref 39–117)
BILIRUBIN TOTAL: 0.6 mg/dL (ref 0.2–1.2)
BUN: 18 mg/dL (ref 6–23)
CALCIUM: 9.6 mg/dL (ref 8.4–10.5)
CO2: 31 mEq/L (ref 19–32)
CREATININE: 1 mg/dL (ref 0.4–1.2)
Chloride: 100 mEq/L (ref 96–112)
GFR: 54.34 mL/min — ABNORMAL LOW (ref >60.00)
Glucose, Bld: 107 mg/dL — ABNORMAL HIGH (ref 70–99)
Potassium: 4 mEq/L (ref 3.5–5.1)
SODIUM: 138 meq/L (ref 135–145)
Total Protein: 7.2 g/dL (ref 6.0–8.3)

## 2014-03-31 LAB — MICROALBUMIN / CREATININE URINE RATIO
CREATININE, U: 43.4 mg/dL
MICROALB UR: 0.4 mg/dL (ref 0.0–1.9)
Microalb Creat Ratio: 0.9 mg/g (ref 0.0–30.0)

## 2014-04-02 NOTE — Assessment & Plan Note (Addendum)
Noted on CT chest done at [facility] in late February during ER evaluation for chest pain.  Fibrotic changes to lower lobes, and centrilobular emphysema was also noted .  She has no history of personal tobacco abuse, so her risk for bronchogenic carcinoma is low.  Alpha antitrypsin has been ordered and she has a single mutation per report.  referral to Dr. Dema Severin is in process. She has not had pulmonary function tests since she is asymptomatic

## 2014-04-02 NOTE — Assessment & Plan Note (Signed)
Labile ,  Aggravated by marital discord and exertion.  Baseline BP is well controlled. Continue prn use of clonidine

## 2014-04-05 LAB — ALPHA-1-ANTITRYPSIN DEFICIENCY

## 2014-04-05 LAB — T4 AND TSH
T4, Total: 8.3 ug/dL (ref 4.5–12.0)
TSH: 3.52 u[IU]/mL (ref 0.450–4.500)

## 2014-04-07 DIAGNOSIS — E039 Hypothyroidism, unspecified: Secondary | ICD-10-CM | POA: Insufficient documentation

## 2014-04-07 NOTE — Assessment & Plan Note (Signed)
Thyroid function is WNL on current dose.  No current changes needed.  Lab Results  Component Value Date   TSH 3.520 03/30/2014

## 2014-04-13 ENCOUNTER — Institutional Professional Consult (permissible substitution): Payer: Self-pay | Admitting: Internal Medicine

## 2014-04-18 ENCOUNTER — Ambulatory Visit (INDEPENDENT_AMBULATORY_CARE_PROVIDER_SITE_OTHER): Payer: Medicare Other | Admitting: Internal Medicine

## 2014-04-18 ENCOUNTER — Encounter: Payer: Self-pay | Admitting: Internal Medicine

## 2014-04-18 VITALS — BP 124/70 | HR 70 | Ht 66.0 in | Wt 121.0 lb

## 2014-04-18 DIAGNOSIS — R918 Other nonspecific abnormal finding of lung field: Secondary | ICD-10-CM

## 2014-04-18 DIAGNOSIS — Z148 Genetic carrier of other disease: Secondary | ICD-10-CM | POA: Insufficient documentation

## 2014-04-18 DIAGNOSIS — J432 Centrilobular emphysema: Secondary | ICD-10-CM | POA: Insufficient documentation

## 2014-04-18 DIAGNOSIS — J438 Other emphysema: Secondary | ICD-10-CM

## 2014-04-18 NOTE — Assessment & Plan Note (Addendum)
Subcentimeter nodules incidentally in this patient: 6 mm nodular opacity superior segment left lower lobe 8 mm nodular opacity superior segment right lower lobe 6 mm nodular opacity superior segment right lower lobe  Most likely a Lung-RADS Category 3: recommended repeat CT scan to patient, however she has deferred to have a 1 year CT follow up, which is a reasonable choice, given that she has no B symptoms, hemoptysis, or smoking history.  She does have prolonged history of second-hand smoke exposure from her husband and other family members.

## 2014-04-18 NOTE — Assessment & Plan Note (Signed)
Centrilobular emphysema noted with patchy fibrotic changes Bases, left greater than right and lobular septal thickening in the lower lobes. Most likely related to being alpha 1 antitrypsin deficiency carrier. Currently asymptomatic-no shortness of breath, no fever, no cough, no weight loss. Close observation and supportive care is warranted at this time

## 2014-04-18 NOTE — Patient Instructions (Signed)
Repeat the CT chest with contrast in February 2016.  We will see you back in February after the CT scan. Call us sooner if needed.

## 2014-04-18 NOTE — Progress Notes (Signed)
Date: 04/18/2014  MRN# [id number] Tiffany Grimes [birth year]  Referring Physician:   SHENAY TORTI is a 80 y.o. old female seen in consultation for   CC:  Chief Complaint  Patient presents with  . Advice Only    Referred by Dr. Darrick Huntsman for pulmonary nodules.  CT and cxr both in PACS. Pt denies any breathing complaints at this time.     HPI:  Tiffany Grimes is a 80 y.o. female who presents for further evaluation of subcentimeter pulmonary nodules, that were incidentally found.  In February 2007 she presented to the ER for chest pressure which woke her sleep. Had chest CT which ruled out PE and aneurysm but found multiple subcentimeter pulmonary nodules and thyroid nodules. She is a lifelong nonsmoker, but father and husband were both smokers. no occupational exposure to solvents or particulate matter. Has a history of thyroid nodule with prior biopsy done remotely (over 10 years ago ) surgeon unknown, and has been followed by her primary care physician since February 2007.  Patient states that she doesn't have any shortness of breath, dyspnea on exertion, night sweats, weight loss, skin rashes. She states for pulmonary standpoint she is doing well, she walks on a daily basis, can climb a flight of stairs without any dyspnea and exertion, and is a never smoker.  Patient is also noted to have centrilobular emphysema with areas of fibrotic change in the lobes, her primary care physician ordered alpha-1 antitrypsin test, genetic profiling, which showed she is a carrier of the disease (one Z allele).    PMHX:   Past Medical History  Diagnosis Date  . Thyroid disease   . TIA (transient ischemic attack) June 2012  . TIA (transient ischemic attack) June 2012  . Grimes esophagus     due for EGD July 2012, [place]  . Reflux   . Hx of thyroid nodule July 2012    benign biopsy   . Lentigo    Surgical Hx:  Past Surgical History  Procedure Laterality Date  . Thyroid surgery  june 18    nodule removed  .  Hernia repair  2008    left inguinal   Family Hx:  Family History  Problem Relation Age of Onset  . Hypertension Mother   . Stroke Mother   . Heart disease Father 64    AMI  . Hypertension Father   . Vision loss Paternal Aunt   . Cancer Neg Hx    Social Hx:   History  Substance Use Topics  . Smoking status: Never Smoker   . Smokeless tobacco: Never Used  . Alcohol Use: No   Medication:   Current Outpatient Rx  Name  Route  Sig  Dispense  Refill  . amLODipine (NORVASC) 5 MG tablet      TAKE 1 TABLET (5 MG TOTAL) BY MOUTH DAILY. AS NEEDED FOR BP > 160   30 tablet   5   . atenolol (TENORMIN) 25 MG tablet      TAKE 1 TABLET TWICE A DAY   180 tablet   0   . atorvastatin (LIPITOR) 20 MG tablet      TAKE 1 TABLET DAILY   90 tablet   0   . brimonidine (ALPHAGAN P) 0.1 % SOLN   Both Eyes   Place 1 drop into both eyes 2 (two) times daily.           . cloNIDine (CATAPRES) 0.1 MG tablet   Oral  Take 1 tablet (0.1 mg total) by mouth 2 (two) times daily. As needed for BP > 150   60 tablet   0   . fluticasone (FLONASE) 50 MCG/ACT nasal spray   Each Nare   Place 2 sprays into both nostrils daily as needed. 2 sprays into each nostril daily         . latanoprost (XALATAN) 0.005 % ophthalmic solution   Both Eyes   Place 1 drop into both eyes 2 (two) times daily.           Marland Kitchen levothyroxine (SYNTHROID, LEVOTHROID) 50 MCG tablet      TAKE 1 TABLET DAILY   90 tablet   2   . NEXIUM 40 MG capsule      TAKE 1 CAPSULE DAILY BEFORE BREAKFAST   90 capsule   3   . polyethylene glycol powder (GLYCOLAX/MIRALAX) powder      TAKE 17 GRAMS BY MOUTH DAILY.   1581 g   1   . sucralfate (CARAFATE) 1 G tablet      TAKE 1 TABLET (1 GM TOTAL) TWICE A DAY   180 tablet   3   . XARELTO 15 MG TABS tablet      TAKE 1 TABLET DAILY   90 tablet   1       Allergies:  Neomycin  Review of Systems: Gen:  Denies  fever, sweats, chills HEENT: Denies blurred vision,  double vision, ear pain, eye pain, hearing loss, nose bleeds, sore throat Cvc:  No dizziness, chest pain or heaviness Resp:   Denies cough or sputum porduction, shortness of breath Gi: Denies swallowing difficulty, stomach pain, nausea or vomiting, diarrhea, constipation, bowel incontinence Gu:  Denies bladder incontinence, burning urine Ext:   No Joint pain, stiffness or swelling Skin: No skin rash, easy bruising or bleeding or hives Endoc:  No polyuria, polydipsia , polyphagia or weight change Psych: No depression, insomnia or hallucinations  Other:  All other systems negative  Physical Examination:   VS: BP 124/70  Pulse 70  Ht 5\' 6"  (1.676 m)  Wt 121 lb (54.885 kg)  BMI 19.54 kg/m2  SpO2 97%  General Appearance: No distress  Neuro:without focal findings, mental status, speech normal, alert and oriented, cranial nerves 2-12 intact, reflexes normal and symmetric, sensation grossly normal  HEENT: PERRLA, EOM intact, no ptosis, no other lesions noticed; Mallampati 2 Pulmonary: normal breath sounds., diaphragmatic excursion normal.No wheezing, No rales;   Sputum Production:   CardiovascularNormal S1,S2.  No m/r/g.  Abdominal aorta pulsation normal.    Abdomen: Benign, Soft, non-tender, No masses, hepatosplenomegaly, No lymphadenopathy Renal:  No costovertebral tenderness  GU:  No performed at this time. Endoc: No evident thyromegaly, no signs of acromegaly or Cushing features Skin:   warm, no rashes, no ecchymosis  Extremities: normal, no cyanosis, clubbing, no edema, warm with normal capillary refill. Other findings:   Labs results:   Rad results:  CTA Chest 08/28/13 FINDINGS: There is no demonstrable thoracic aortic aneurysm or dissection. Great vessels appear patent and normal. Note that there is a common origin of the left and right common carotid arteries, an anatomicvariant. There is no apparent thoracic aortic ulceration. There is mild atherosclerotic change in the aorta.  There are multiple foci ofcoronary artery calcification. Heart is borderline enlarged venous not thickened. There is no demonstrable pulmonary embolus. There is underlying centrilobular emphysema. There is mild lower lobe bronchiectatic change. There are multiple areas of lobular septal thickening in the lower  lobes. There is patchy fibrotic change in the lung bases, more on the left than on the right. There is no well-defined airspace consolidation.  On axial slice 35, series 3, there is a 6 mm nodular opacity in the superior segment of the left lower lobe. On axial slice 30, there is a focal 8 mm nodular opacity in the superior segment right lower lobe. On this same slice, there is a 6 mm nodular opacity in the superior segment right lower lobe.  There is no appreciable thoracic adenopathy. Visualized upper abdominal structures appear unremarkable. There are no appreciable blastic or lytic bone lesions. There are several small nodular opacities in the thyroid. Right lobe of the thyroid is virtually aplastic.  IMPRESSION: No thoracic aortic aneurysm or dissection. No demonstrable pulmonaryembolus.  There is atherosclerotic change. Note that there are multiple coronary artery calcifications.  There is underlying emphysema with areas of fibrotic change in the lower lobes, more on the left than on the right. No airspaceconsolidation.  There are several nodular opacities in the right lower lobe, largest measuring 8 mm. Followup of these nodular opacity should be based on Fleischner Society guidelines. If the patient is at high risk for bronchogenic carcinoma, follow-up chest CT at 3-78months is recommended. If the patient is at low risk for bronchogenic carcinoma, follow-up chest CT at 6-12 months is recommended. This recommendation follows the consensus statement: Guidelines for Management of Small Pulmonary Nodules Detected on CT Scans: A Statement from the Fleischner Society as published in Radiology 2005;  237:395-400. Nodular opacities in thyroid. Nonemergent thyroid ultrasound to further assess may be advisable.   Assessment and Plan: Pulmonary nodules/lesions, multiple Subcentimeter nodules incidentally in this patient: 6 mm nodular opacity superior segment left lower lobe 8 mm nodular opacity superior segment right lower lobe 6 mm nodular opacity superior segment right lower lobe  Most likely a Lung-RADS Category 3: recommended repeat CT scan to patient, however she has deferred to have a 1 year CT follow up, which is a reasonable choice, given that she has no B symptoms, hemoptysis, or smoking history.  She does have prolonged history of second-hand smoke exposure from her husband and other family members.   Multiple pulmonary nodules Patient is a nonsmoker. Risk of malignancy is low to medium, she does have prolonged secondhand smoking exposure from her husband and other family members. Offered patient to have repeat CT scan done, however she is deferred to having a CT scan done at this time and requested to have a 1 year followup CT scan.   Centrilobular emphysema Centrilobular emphysema noted with patchy fibrotic changes Bases, left greater than right and lobular septal thickening in the lower lobes. Most likely related to being alpha 1 antitrypsin deficiency carrier. Currently asymptomatic-no shortness of breath, no fever, no cough, no weight loss. Close observation and supportive care is warranted at this time  Alpha-1-antitrypsin deficiency carrier Single allele (Z) carrier - asymptomatic Does not meet criteria at this time for alpha-1 antitrypsin replacement therapy. Close monitoring and observation. Supportive care. May consider testing other family members for carrier trait.     Updated Medication List Outpatient Encounter Prescriptions as of 04/18/2014  Medication Sig  . amLODipine (NORVASC) 5 MG tablet TAKE 1 TABLET (5 MG TOTAL) BY MOUTH DAILY. AS NEEDED FOR BP >  160  . atenolol (TENORMIN) 25 MG tablet TAKE 1 TABLET TWICE A DAY  . atorvastatin (LIPITOR) 20 MG tablet TAKE 1 TABLET DAILY  . brimonidine (ALPHAGAN P) 0.1 % SOLN  Place 1 drop into both eyes 2 (two) times daily.    . cloNIDine (CATAPRES) 0.1 MG tablet Take 1 tablet (0.1 mg total) by mouth 2 (two) times daily. As needed for BP > 150  . fluticasone (FLONASE) 50 MCG/ACT nasal spray Place 2 sprays into both nostrils daily as needed. 2 sprays into each nostril daily  . latanoprost (XALATAN) 0.005 % ophthalmic solution Place 1 drop into both eyes 2 (two) times daily.    Marland Kitchen levothyroxine (SYNTHROID, LEVOTHROID) 50 MCG tablet TAKE 1 TABLET DAILY  . NEXIUM 40 MG capsule TAKE 1 CAPSULE DAILY BEFORE BREAKFAST  . polyethylene glycol powder (GLYCOLAX/MIRALAX) powder TAKE 17 GRAMS BY MOUTH DAILY.  Marland Kitchen sucralfate (CARAFATE) 1 G tablet TAKE 1 TABLET (1 GM TOTAL) TWICE A DAY  . XARELTO 15 MG TABS tablet TAKE 1 TABLET DAILY    Orders for this visit: Orders Placed This Encounter  Procedures  . CT Chest W Contrast    Standing Status: Future     Number of Occurrences:      Standing Expiration Date: 06/19/2015    Scheduling Instructions:     Please schedule in Feb. 2016.  Thank you.    Order Specific Question:  Reason for Exam (SYMPTOM  OR DIAGNOSIS REQUIRED)    Answer:  follow up lung nodules    Order Specific Question:  Preferred imaging location?    Answer:   [facility]     Thank  you for the consultation and for allowing [facility] to assist in the care of your patient. Our recommendations are noted above.  Please contact us if we can be of further service.   Stephanie Acre, MD [facility] Office Number: [phone]

## 2014-04-18 NOTE — Assessment & Plan Note (Signed)
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 Close monitoring and observation. Supportive care. May consider testing other family members for carrier trait.

## 2014-04-18 NOTE — Assessment & Plan Note (Signed)
Patient is a nonsmoker. Risk of malignancy is low to medium, she does have prolonged secondhand smoking exposure from her husband and other family members. Offered patient to have repeat CT scan done, however she is deferred to having a CT scan done at this time and requested to have a 1 year followup CT scan.

## 2014-04-21 ENCOUNTER — Other Ambulatory Visit: Payer: Self-pay | Admitting: Internal Medicine

## 2014-05-02 ENCOUNTER — Telehealth: Payer: Self-pay | Admitting: Critical Care Medicine

## 2014-05-02 NOTE — Telephone Encounter (Signed)
Called pt. She is due for CT scan in 08/2014. Pt is wanting to know if this is already scheduled. Pt is aware we did not order US soft tissue or neck scan. Please advise PCC's thanks

## 2014-05-03 ENCOUNTER — Telehealth: Payer: Self-pay | Admitting: Internal Medicine

## 2014-05-03 MED ORDER — POLYETHYLENE GLYCOL 3350 17 GM/SCOOP PO POWD: ORAL | Status: DC

## 2014-05-03 NOTE — Telephone Encounter (Signed)
Patient called for refill on Mira-lax refill sent escript.

## 2014-05-03 NOTE — Telephone Encounter (Signed)
Pt aware she will be called in jan 2016 for this appt in feb 16 Tiffany Grimes

## 2014-05-10 ENCOUNTER — Emergency Department: Payer: Self-pay | Admitting: Emergency Medicine

## 2014-05-10 ENCOUNTER — Other Ambulatory Visit: Payer: Self-pay | Admitting: Internal Medicine

## 2014-05-10 LAB — CBC
HCT: 44.2 % (ref 35.0–47.0)
HGB: 14.3 g/dL (ref 12.0–16.0)
MCH: 28.5 pg (ref 26.0–34.0)
MCHC: 32.3 g/dL (ref 32.0–36.0)
MCV: 88 fL (ref 80–100)
Platelet: 201 10*3/uL (ref 150–440)
RBC: 5.02 10*6/uL (ref 3.80–5.20)
RDW: 13.9 % (ref 11.5–14.5)
WBC: 6.2 10*3/uL (ref 3.6–11.0)

## 2014-05-10 LAB — BASIC METABOLIC PANEL
Anion Gap: 6 — ABNORMAL LOW (ref 7–16)
BUN: 19 mg/dL — AB (ref 7–18)
CALCIUM: 8.5 mg/dL (ref 8.5–10.1)
CHLORIDE: 106 mmol/L (ref 98–107)
CO2: 30 mmol/L (ref 21–32)
CREATININE: 1.08 mg/dL (ref 0.60–1.30)
EGFR (African American): >60
EGFR (Non-African Amer.): 51 — ABNORMAL LOW
GLUCOSE: 134 mg/dL — AB (ref 65–99)
OSMOLALITY: 287 (ref 275–301)
Potassium: 4 mmol/L (ref 3.5–5.1)
SODIUM: 142 mmol/L (ref 136–145)

## 2014-05-10 LAB — TROPONIN I: Troponin-I: <0.02 ng/mL

## 2014-05-11 DIAGNOSIS — I10 Essential (primary) hypertension: Secondary | ICD-10-CM | POA: Insufficient documentation

## 2014-05-11 DIAGNOSIS — I340 Nonrheumatic mitral (valve) insufficiency: Secondary | ICD-10-CM | POA: Insufficient documentation

## 2014-05-11 LAB — MAGNESIUM: Magnesium: 2 mg/dL

## 2014-05-12 ENCOUNTER — Other Ambulatory Visit: Payer: Self-pay | Admitting: Internal Medicine

## 2014-05-13 ENCOUNTER — Telehealth: Payer: Self-pay

## 2014-05-13 DIAGNOSIS — F411 Generalized anxiety disorder: Secondary | ICD-10-CM

## 2014-05-13 MED ORDER — ESCITALOPRAM OXALATE 10 MG PO TABS: 10.0000 mg | ORAL_TABLET | Freq: Every day | ORAL | Status: DC

## 2014-05-13 NOTE — Telephone Encounter (Signed)
Pt notified and  verbalized understanding, wrote down instructions. Follow up appt scheduled 06/06/14. Advised to call back sooner with any problems.

## 2014-05-13 NOTE — Telephone Encounter (Signed)
We can resume a trial of lexapro which is safe and taken daily to manage anxiety.  I will send rx to pharmacy ,   Please start the Lexapro (escitalopram) at 1/2 tablet daily in the evening for the first few days to avoid nausea.  You can increase to a full tablet after 4 days if you havenot developed side effects of nausea.  If the lexapro interferes with your sleep, take it in the morning instead  Please return in  4 weeks ,

## 2014-05-13 NOTE — Telephone Encounter (Signed)
Last visit 03/30/14, I don't see mention of Rx for stress, please advise

## 2014-05-13 NOTE — Telephone Encounter (Signed)
The patient called hoping to have something prescribed for stress.  She stated this was discussed in her last ov.  Pt callback - [phone]

## 2014-05-20 ENCOUNTER — Other Ambulatory Visit: Payer: Self-pay | Admitting: Internal Medicine

## 2014-06-04 ENCOUNTER — Other Ambulatory Visit: Payer: Self-pay | Admitting: Internal Medicine

## 2014-06-06 ENCOUNTER — Ambulatory Visit (INDEPENDENT_AMBULATORY_CARE_PROVIDER_SITE_OTHER): Payer: Medicare Other | Admitting: Internal Medicine

## 2014-06-06 ENCOUNTER — Encounter: Payer: Self-pay | Admitting: Internal Medicine

## 2014-06-06 VITALS — BP 128/78 | HR 74 | Temp 98.4°F | Resp 16 | Ht 66.0 in | Wt 123.5 lb

## 2014-06-06 DIAGNOSIS — Z8639 Personal history of other endocrine, nutritional and metabolic disease: Secondary | ICD-10-CM

## 2014-06-06 DIAGNOSIS — I10 Essential (primary) hypertension: Secondary | ICD-10-CM

## 2014-06-06 DIAGNOSIS — E785 Hyperlipidemia, unspecified: Secondary | ICD-10-CM

## 2014-06-06 DIAGNOSIS — F03918 Unspecified dementia, unspecified severity, with other behavioral disturbance: Secondary | ICD-10-CM

## 2014-06-06 DIAGNOSIS — Z79899 Other long term (current) drug therapy: Secondary | ICD-10-CM

## 2014-06-06 DIAGNOSIS — F411 Generalized anxiety disorder: Secondary | ICD-10-CM

## 2014-06-06 DIAGNOSIS — F0391 Unspecified dementia with behavioral disturbance: Secondary | ICD-10-CM

## 2014-06-06 MED ORDER — TETANUS-DIPHTH-ACELL PERTUSSIS 5-2.5-18.5 LF-MCG/0.5 IM SUSP: 0.5000 mL | Freq: Once | INTRAMUSCULAR | Status: DC

## 2014-06-06 MED ORDER — AMLODIPINE BESYLATE 5 MG PO TABS: 5.0000 mg | ORAL_TABLET | Freq: Every day | ORAL | Status: DC

## 2014-06-06 NOTE — Patient Instructions (Signed)
You are doing well today/  Continue your current medications   We will schedule your thyroid ultrasound in February    You need to have a TDaP vaccine   I have given you prescription for this  beause it  will be cheaper at a local pharmacy because Medicare will not reimburse for them.     We will contact you with the bloodwork results

## 2014-06-06 NOTE — Progress Notes (Signed)
Pre-visit discussion using our clinic review tool. No additional management support is needed unless otherwise documented below in the visit note.  

## 2014-06-06 NOTE — Progress Notes (Signed)
Patient ID: Tiffany Grimes, female   DOB: [birth year], 80 y.o.   MRN: [id number]  Patient Active Problem List   Diagnosis Date Noted  . Centrilobular emphysema 04/18/2014  . Alpha-1-antitrypsin deficiency carrier 04/18/2014  . Unspecified hypothyroidism 04/07/2014  . Multiple pulmonary nodules 10/05/2013  . Pulmonary nodules/lesions, multiple 08/31/2013  . PAD (peripheral artery disease) 08/08/2013  . External hemorrhoid 08/02/2013  . Anal fissure 08/02/2013  . Pain in inferior right lower extremity 05/21/2013  . Ecchymosis 05/10/2013  . Anxiety state 10/28/2012  . Routine general medical examination at a health care facility 06/15/2012  . 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 Osteoporosis, post-menopausal 02/11/2012  . Lumbago with sciatica of right side 02/11/2012  . Posterior neck pain 02/11/2012  . Hyperlipidemia LDL goal <100 12/16/2011  . Hx of thyroid nodule   . Atrial fibrillation, new onset 08/02/2011  . Chest pain, atypical 08/02/2011  . TIA (transient ischemic attack)   . Barrett esophagus   . Hypertension 03/29/2011    Subjective:  CC:   Chief Complaint  Patient presents with  . Follow-up    Anxiety    HPI:   Tiffany Grimes is a 80 y.o. female who presents for  follow up on anxiety.  States that she is better "keeping my mouth shut.  You dont' know the entire story"   Has chronic intemittent insomnia occurring about two nights per week . Taking lexapro 5 mg daily   BP has been more stable,  No ER visits in 5 weeks,  Has only used clonidine twice    Past Medical History  Diagnosis Date  . Thyroid disease   . TIA (transient ischemic attack) June 2012  . TIA (transient ischemic attack) June 2012  . Barrett esophagus     due for EGD July 2012, [place]  . Reflux   . Hx of thyroid nodule July 2012    benign biopsy   . Lentigo     Past Surgical History  Procedure Laterality Date  . Thyroid surgery  june 18    nodule removed  . Hernia repair   2008    left inguinal       The following portions of the patient's history were reviewed and updated as appropriate: Allergies, current medications, and problem list.    Review of Systems:   Patient denies headache, fevers, malaise, unintentional weight loss, skin rash, eye pain, sinus congestion and sinus pain, sore throat, dysphagia,  hemoptysis , cough, dyspnea, wheezing, chest pain, palpitations, orthopnea, edema, abdominal pain, nausea, melena, diarrhea, constipation, flank pain, dysuria, hematuria, urinary  Frequency, nocturia, numbness, tingling, seizures,  Focal weakness, Loss of consciousness,  Tremor, insomnia, depression, anxiety, and suicidal ideation.     History   Social History  . Marital Status: Married    Spouse Name: N/A    Number of Children: N/A  . Years of Education: N/A   Occupational History  . Not on file.   Social History Main Topics  . Smoking status: Never Smoker   . Smokeless tobacco: Never Used  . Alcohol Use: No  . Drug Use: No  . Sexual Activity: Not on file   Other Topics Concern  . Not on file   Social History Narrative    Objective:  Filed Vitals:   06/06/14 1048  BP: 128/78  Pulse: 74  Temp: 98.4 F (36.9 C)  Resp: 16     General appearance: alert, cooperative and appears stated age Ears: normal TM's and external  ear canals both ears Throat: lips, mucosa, and tongue normal; teeth and gums normal Neck: no adenopathy, no carotid bruit, supple, symmetrical, trachea midline and thyroid not enlarged, symmetric, no tenderness/mass/nodules Back: symmetric, no curvature. ROM normal. No CVA tenderness. Lungs: clear to auscultation bilaterally Heart: regular rate and rhythm, S1, S2 normal, no murmur, click, rub or gallop Abdomen: soft, non-tender; bowel sounds normal; no masses,  no organomegaly Pulses: 2+ and symmetric Skin: Skin color, texture, turgor normal. No rashes or lesions Lymph nodes: Cervical, supraclavicular, and  axillary nodes normal.  Assessment and Plan:  Hx of thyroid nodule Repeat ultrasound ordered to follow up on thyroid nodules.  Hypertension Labile ,  Aggravated by marital discord and exertion.  Baseline BP is well controlled. Continue prn use of clonidine     Presenile dementia with paranoia She continues to endorse delusiones about marital infidelity .    Hyperlipidemia LDL goal <100 Well controlled on current statin therapy.   Liver enzymes are normal , no changes today.  Lab Results  Component Value Date   CHOL 139 06/06/2014   HDL 53.70 06/06/2014   LDLCALC 73 06/06/2014   TRIG 62.0 06/06/2014   CHOLHDL 3 06/06/2014   Lab Results  Component Value Date   ALT 26 06/06/2014   AST 26 06/06/2014   ALKPHOS 65 06/06/2014   BILITOT 0.8 06/06/2014      Anxiety state Improved demeanor with use of of lexpro.    Updated Medication List Outpatient Encounter Prescriptions as of 06/06/2014  Medication Sig  . amLODipine (NORVASC) 5 MG tablet Take 1 tablet (5 mg total) by mouth daily.  Marland Kitchen atenolol (TENORMIN) 25 MG tablet TAKE 1 TABLET TWICE A DAY  . atorvastatin (LIPITOR) 20 MG tablet TAKE 1 TABLET DAILY  . brimonidine (ALPHAGAN P) 0.1 % SOLN Place 1 drop into both eyes 2 (two) times daily.    . cloNIDine (CATAPRES) 0.1 MG tablet Take 1 tablet (0.1 mg total) by mouth 2 (two) times daily. As needed for BP > 150  . escitalopram (LEXAPRO) 10 MG tablet Take 1 tablet (10 mg total) by mouth daily.  Marland Kitchen esomeprazole (NEXIUM) 40 MG capsule TAKE 1 CAPSULE DAILY BEFORE BREAKFAST  . fluticasone (FLONASE) 50 MCG/ACT nasal spray Place 2 sprays into both nostrils daily as needed. 2 sprays into each nostril daily  . latanoprost (XALATAN) 0.005 % ophthalmic solution Place 1 drop into both eyes 2 (two) times daily.    Marland Kitchen levothyroxine (SYNTHROID, LEVOTHROID) 50 MCG tablet TAKE 1 TABLET DAILY  . polyethylene glycol powder (GLYCOLAX/MIRALAX) powder TAKE 17 GRAMS BY MOUTH DAILY.  Marland Kitchen sucralfate  (CARAFATE) 1 G tablet TAKE 1 TABLET TWICE A DAY  . XARELTO 15 MG TABS tablet TAKE 1 TABLET DAILY  . [DISCONTINUED] amLODipine (NORVASC) 5 MG tablet TAKE 1 TABLET (5 MG TOTAL) BY MOUTH DAILY. AS NEEDED FOR BP > 160  . Tdap (BOOSTRIX) 5-2.5-18.5 LF-MCG/0.5 injection Inject 0.5 mLs into the muscle once.  . [DISCONTINUED] amLODipine (NORVASC) 5 MG tablet TAKE 1 TABLET (5 MG TOTAL) BY MOUTH DAILY. AS NEEDED FOR BP > 160     Orders Placed This Encounter  Procedures  . US Soft Tissue Head/Neck  . Lipid panel  . Comprehensive metabolic panel    Return in about 6 months (around 12/05/2014).

## 2014-06-06 NOTE — Assessment & Plan Note (Addendum)
Repeat ultrasound ordered to follow up on thyroid nodules.

## 2014-06-07 LAB — COMPREHENSIVE METABOLIC PANEL
ALT: 26 U/L (ref 0–35)
AST: 26 U/L (ref 0–37)
Albumin: 4.2 g/dL (ref 3.5–5.2)
Alkaline Phosphatase: 65 U/L (ref 39–117)
BUN: 17 mg/dL (ref 6–23)
CHLORIDE: 102 meq/L (ref 96–112)
CO2: 28 meq/L (ref 19–32)
Calcium: 8.8 mg/dL (ref 8.4–10.5)
Creatinine, Ser: 1 mg/dL (ref 0.4–1.2)
GFR: 56.85 mL/min — AB (ref >60.00)
Glucose, Bld: 104 mg/dL — ABNORMAL HIGH (ref 70–99)
POTASSIUM: 4.4 meq/L (ref 3.5–5.1)
Sodium: 135 mEq/L (ref 135–145)
TOTAL PROTEIN: 6.2 g/dL (ref 6.0–8.3)
Total Bilirubin: 0.8 mg/dL (ref 0.2–1.2)

## 2014-06-07 LAB — LIPID PANEL
CHOL/HDL RATIO: 3
Cholesterol: 139 mg/dL (ref 0–200)
HDL: 53.7 mg/dL (ref >39.00)
LDL Cholesterol: 73 mg/dL (ref 0–99)
NONHDL: 85.3
Triglycerides: 62 mg/dL (ref 0.0–149.0)
VLDL: 12.4 mg/dL (ref 0.0–40.0)

## 2014-06-07 NOTE — Assessment & Plan Note (Signed)
Improved demeanor with use of of lexpro.

## 2014-06-07 NOTE — Assessment & Plan Note (Signed)
Labile ,  Aggravated by marital discord and exertion.  Baseline BP is well controlled. Continue prn use of clonidine  

## 2014-06-07 NOTE — Assessment & Plan Note (Signed)
Well controlled on current statin therapy.   Liver enzymes are normal , no changes today.  Lab Results  Component Value Date   CHOL 139 06/06/2014   HDL 53.70 06/06/2014   LDLCALC 73 06/06/2014   TRIG 62.0 06/06/2014   CHOLHDL 3 06/06/2014   Lab Results  Component Value Date   ALT 26 06/06/2014   AST 26 06/06/2014   ALKPHOS 65 06/06/2014   BILITOT 0.8 06/06/2014

## 2014-06-07 NOTE — Assessment & Plan Note (Signed)
She continues to endorse delusiones about marital infidelity .

## 2014-06-08 ENCOUNTER — Encounter: Payer: Self-pay | Admitting: *Deleted

## 2014-06-08 ENCOUNTER — Telehealth: Payer: Self-pay

## 2014-06-08 NOTE — Telephone Encounter (Signed)
LVM for pt to call back.   RE: scheduling AWV for 2015 with Lyla Son

## 2014-06-13 ENCOUNTER — Ambulatory Visit: Payer: Self-pay | Admitting: Internal Medicine

## 2014-06-19 ENCOUNTER — Telehealth: Payer: Self-pay | Admitting: Internal Medicine

## 2014-06-19 DIAGNOSIS — Z8639 Personal history of other endocrine, nutritional and metabolic disease: Secondary | ICD-10-CM

## 2014-06-19 NOTE — Assessment & Plan Note (Signed)
Repeat ultrasound of thyroid done Nov 2015 shows that the tiny nodules in the left lobe are unchanged and < 1 cm in size .  Will repeat in one year   

## 2014-06-19 NOTE — Telephone Encounter (Signed)
Repeat ultrasound of thyroid done Nov 2015 shows that the tiny nodules in the left lobe are unchanged and < 1 cm in size .  Will repeat in one year

## 2014-06-20 NOTE — Telephone Encounter (Signed)
Called and advised patient of results,  verbalized understanding. 

## 2014-06-29 ENCOUNTER — Encounter: Payer: Self-pay | Admitting: Internal Medicine

## 2014-07-12 ENCOUNTER — Ambulatory Visit: Payer: Self-pay | Admitting: Internal Medicine

## 2014-07-12 LAB — HM MAMMOGRAPHY: HM MAMMO: NEGATIVE

## 2014-07-13 ENCOUNTER — Encounter: Payer: Self-pay | Admitting: *Deleted

## 2014-07-19 ENCOUNTER — Telehealth: Payer: Self-pay | Admitting: Internal Medicine

## 2014-07-19 ENCOUNTER — Emergency Department: Payer: Self-pay | Admitting: Student

## 2014-07-19 LAB — COMPREHENSIVE METABOLIC PANEL
ALK PHOS: 83 U/L
Albumin: 3.7 g/dL (ref 3.4–5.0)
Anion Gap: 8 (ref 7–16)
BUN: 13 mg/dL (ref 7–18)
Bilirubin,Total: 0.7 mg/dL (ref 0.2–1.0)
CALCIUM: 8.5 mg/dL (ref 8.5–10.1)
Chloride: 105 mmol/L (ref 98–107)
Co2: 28 mmol/L (ref 21–32)
Creatinine: 0.92 mg/dL (ref 0.60–1.30)
GLUCOSE: 114 mg/dL — AB (ref 65–99)
Osmolality: 282 (ref 275–301)
Potassium: 4.2 mmol/L (ref 3.5–5.1)
SGOT(AST): 50 U/L — ABNORMAL HIGH (ref 15–37)
SGPT (ALT): 34 U/L
Sodium: 141 mmol/L (ref 136–145)
Total Protein: 6.8 g/dL (ref 6.4–8.2)

## 2014-07-19 LAB — CBC
HCT: 41.5 % (ref 35.0–47.0)
HGB: 13.3 g/dL (ref 12.0–16.0)
MCH: 27.8 pg (ref 26.0–34.0)
MCHC: 32 g/dL (ref 32.0–36.0)
MCV: 87 fL (ref 80–100)
Platelet: 192 10*3/uL (ref 150–440)
RBC: 4.78 10*6/uL (ref 3.80–5.20)
RDW: 13.7 % (ref 11.5–14.5)
WBC: 5.3 10*3/uL (ref 3.6–11.0)

## 2014-07-19 LAB — URINALYSIS, COMPLETE
BACTERIA: NONE SEEN
Bilirubin,UR: NEGATIVE
GLUCOSE, UR: NEGATIVE mg/dL (ref 0–75)
Ketone: NEGATIVE
Leukocyte Esterase: NEGATIVE
Nitrite: NEGATIVE
Ph: 7 (ref 4.5–8.0)
Protein: NEGATIVE
Specific Gravity: 1.005 (ref 1.003–1.030)
Squamous Epithelial: 1
WBC UR: NONE SEEN /HPF (ref 0–5)

## 2014-07-19 LAB — CK TOTAL AND CKMB (NOT AT ARMC)
CK, TOTAL: 203 U/L — AB (ref 26–192)
CK, Total: 179 U/L (ref 26–192)
CK-MB: 8 ng/mL — AB (ref 0.5–3.6)
CK-MB: 7.1 ng/mL — ABNORMAL HIGH (ref 0.5–3.6)

## 2014-07-19 LAB — PROTIME-INR
INR: 2.2
Prothrombin Time: 23.5 secs — ABNORMAL HIGH (ref 11.5–14.7)

## 2014-07-19 LAB — TROPONIN I
Troponin-I: <0.02 ng/mL
Troponin-I: <0.02 ng/mL

## 2014-07-19 LAB — APTT: Activated PTT: 41.8 secs — ABNORMAL HIGH (ref 23.6–35.9)

## 2014-07-19 NOTE — Telephone Encounter (Signed)
Patient seen by Dr. Dondra SpryGail For increased BP and palpitations MD advised patient ok to go home with 24 to 48 hour follow up. Patient scheduled 07/20/14

## 2014-07-21 ENCOUNTER — Encounter: Payer: Self-pay | Admitting: Internal Medicine

## 2014-07-21 ENCOUNTER — Ambulatory Visit (INDEPENDENT_AMBULATORY_CARE_PROVIDER_SITE_OTHER): Payer: Medicare Other | Admitting: Internal Medicine

## 2014-07-21 VITALS — BP 128/72 | HR 84 | Temp 98.5°F | Resp 14 | Ht 66.0 in | Wt 119.5 lb

## 2014-07-21 DIAGNOSIS — I4891 Unspecified atrial fibrillation: Secondary | ICD-10-CM

## 2014-07-21 NOTE — Patient Instructions (Addendum)
You need to get a new BP monitor,  One that also detects irregular heart beat'' [facility] has a good selection  Of machines in front of the pharmacy   If your pulse is over 100  You can take an additional  dose of atenolol 25 mg  But NOT MORE THAN ONE DOSE.  Your other blood pressure medications will NOT slow down your heart rate.     2) These heart pauses your are experiencing need to be checked out by Dr Gwen Pounds .  Please make an  appt to see him ASAP SO HE CAN SET UP A MONITOR OF YOUR HEART

## 2014-07-21 NOTE — Progress Notes (Signed)
Patient ID: Tiffany Grimes, female   DOB: October 23, 1928, 80 y.o.   MRN: [id number]  Patient Active Problem List   Diagnosis Date Noted  . Centrilobular emphysema 04/18/2014  . Alpha-1-antitrypsin deficiency carrier 04/18/2014  . Unspecified hypothyroidism 04/07/2014  . Multiple pulmonary nodules 10/05/2013  . Pulmonary nodules/lesions, multiple 08/31/2013  . PAD (peripheral artery disease) 08/08/2013  . External hemorrhoid 08/02/2013  . Anal fissure 08/02/2013  . Pain in inferior right lower extremity 05/21/2013  . Ecchymosis 05/10/2013  . Anxiety state 10/28/2012  . Routine general medical examination at a health care facility 06/15/2012  . 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 Osteoporosis, post-menopausal 02/11/2012  . Lumbago with sciatica of right side 02/11/2012  . Posterior neck pain 02/11/2012  . Hyperlipidemia LDL goal <100 12/16/2011  . Hx of thyroid nodule   . Atrial fibrillation, new onset 08/02/2011  . Chest pain, atypical 08/02/2011  . TIA (transient ischemic attack)   . Barrett esophagus   . Hypertension 03/29/2011    Subjective:  CC:   Chief Complaint  Patient presents with  . Hospitalization Follow-up    ED notes given to provider    HPI:   Tiffany Grimes is a 80 y.o. female who presents for  follow up on recent ER visit 12/29 for palpitations.  Patient reports sudden onset of  heart racing and pounding 2 nights ago which occurred after cleaning up her kitchen from Christmas dinner. .The following  morning BP was up so she took  A dose of clonidine,  Which lowered her pressure, and  several hours later took her other meds ,  Still felt heart pounding .  Was NOT having chest pain but took a 325 mg aspirin and laid down to rest for a while.  Talked herslef into going to ER bc she couldn't reach husband and heart was still racing .  Then last night she reports that she was woken up by the sensation that  "my heart cut off" . She reports that her  Heart stopped  beating for 30 minutes .    Past Medical History  Diagnosis Date  . Thyroid disease   . TIA (transient ischemic attack) June 2012  . TIA (transient ischemic attack) June 2012  . Barrett esophagus     due for EGD July 2012, [place]  . Reflux   . Hx of thyroid nodule July 2012    benign biopsy   . Lentigo     Past Surgical History  Procedure Laterality Date  . Thyroid surgery  june 18    nodule removed  . Hernia repair  2008    left inguinal       The following portions of the patient's history were reviewed and updated as appropriate: Allergies, current medications, and problem list.    Review of Systems:   Patient denies headache, fevers, malaise, unintentional weight loss, skin rash, eye pain, sinus congestion and sinus pain, sore throat, dysphagia,  hemoptysis , cough, dyspnea, wheezing, chest pain, palpitations, orthopnea, edema, abdominal pain, nausea, melena, diarrhea, constipation, flank pain, dysuria, hematuria, urinary  Frequency, nocturia, numbness, tingling, seizures,  Focal weakness, Loss of consciousness,  Tremor, insomnia, depression, anxiety, and suicidal ideation.     History   Social History  . Marital Status: Married    Spouse Name: N/A    Number of Children: N/A  . Years of Education: N/A   Occupational History  . Not on file.   Social History Main Topics  .  Smoking status: Never Smoker   . Smokeless tobacco: Never Used  . Alcohol Use: No  . Drug Use: No  . Sexual Activity: Not on file   Other Topics Concern  . Not on file   Social History Narrative    Objective:  Filed Vitals:   07/21/14 1200  BP: 128/72  Pulse: 84  Temp: 98.5 F (36.9 C)  Resp: 14     General appearance: alert, cooperative and appears stated age Ears: normal TM's and external ear canals both ears Throat: lips, mucosa, and tongue normal; teeth and gums normal Neck: no adenopathy, no carotid bruit, supple, symmetrical, trachea midline and thyroid not  enlarged, symmetric, no tenderness/mass/nodules Back: symmetric, no curvature. ROM normal. No CVA tenderness. Lungs: clear to auscultation bilaterally Heart: regular rate and rhythm, S1, S2 normal, no murmur, click, rub or gallop Abdomen: soft, non-tender; bowel sounds normal; no masses,  no organomegaly Pulses: 2+ and symmetric Skin: Skin color, texture, turgor normal. No rashes or lesions Lymph nodes: Cervical, supraclavicular, and axillary nodes normal.  Assessment and Plan:  Atrial fibrillation, new onset Advised her to buy a home monitor that allows her to monitor pulse and to take a second dose of metoprolol for sustained HR > 100.  Continue xarelto.  Her episode last night is concerning for sinus pauses and SSS.  Advised to see her cardiologist for a LOOP monitor.   A total of 25  minutes of face to face time was spent with patient more than half of which was spent in counselling and coordination of care   Updated Medication List Outpatient Encounter Prescriptions as of 07/21/2014  Medication Sig  . amLODipine (NORVASC) 5 MG tablet Take 1 tablet (5 mg total) by mouth daily.  Marland Kitchen. atenolol (TENORMIN) 25 MG tablet TAKE 1 TABLET TWICE A DAY  . atorvastatin (LIPITOR) 20 MG tablet TAKE 1 TABLET DAILY  . brimonidine (ALPHAGAN P) 0.1 % SOLN Place 1 drop into both eyes 2 (two) times daily.    . cloNIDine (CATAPRES) 0.1 MG tablet Take 1 tablet (0.1 mg total) by mouth 2 (two) times daily. As needed for BP > 150  . escitalopram (LEXAPRO) 10 MG tablet Take 1 tablet (10 mg total) by mouth daily.  Marland Kitchen. esomeprazole (NEXIUM) 40 MG capsule TAKE 1 CAPSULE DAILY BEFORE BREAKFAST  . fluticasone (FLONASE) 50 MCG/ACT nasal spray Place 2 sprays into both nostrils daily as needed. 2 sprays into each nostril daily  . latanoprost (XALATAN) 0.005 % ophthalmic solution Place 1 drop into both eyes 2 (two) times daily.    Marland Kitchen. levothyroxine (SYNTHROID, LEVOTHROID) 50 MCG tablet TAKE 1 TABLET DAILY  . polyethylene  glycol powder (GLYCOLAX/MIRALAX) powder TAKE 17 GRAMS BY MOUTH DAILY.  Marland Kitchen. sucralfate (CARAFATE) 1 G tablet TAKE 1 TABLET TWICE A DAY  . XARELTO 15 MG TABS tablet TAKE 1 TABLET DAILY  . Tdap (BOOSTRIX) 5-2.5-18.5 LF-MCG/0.5 injection Inject 0.5 mLs into the muscle once. (Patient not taking: Reported on 07/21/2014)     No orders of the defined types were placed in this encounter.    Return in about 3 months (around 10/20/2014).

## 2014-07-23 NOTE — Assessment & Plan Note (Addendum)
Advised her to buy a home monitor that allows her to monitor pulse and to take a second dose of metoprolol for sustained HR > 100.  Continue xarelto.  Her episode last night is concerning for sinus pauses and SSS.  Advised to see her cardiologist for a LOOP monitor.

## 2014-07-28 ENCOUNTER — Other Ambulatory Visit: Payer: Self-pay | Admitting: *Deleted

## 2014-07-28 MED ORDER — FLUTICASONE PROPIONATE 50 MCG/ACT NA SUSP: 2.0000 | Freq: Every day | NASAL | Status: DC | PRN

## 2014-07-28 NOTE — Progress Notes (Signed)
Script filled for [employer].

## 2014-08-13 ENCOUNTER — Other Ambulatory Visit: Payer: Self-pay | Admitting: Internal Medicine

## 2014-08-16 ENCOUNTER — Encounter: Payer: Self-pay | Admitting: Internal Medicine

## 2014-08-29 ENCOUNTER — Ambulatory Visit: Payer: Self-pay | Admitting: Internal Medicine

## 2014-09-05 ENCOUNTER — Other Ambulatory Visit: Payer: Self-pay | Admitting: Internal Medicine

## 2014-09-28 ENCOUNTER — Ambulatory Visit (INDEPENDENT_AMBULATORY_CARE_PROVIDER_SITE_OTHER): Payer: Medicare Other | Admitting: Internal Medicine

## 2014-09-28 ENCOUNTER — Encounter (INDEPENDENT_AMBULATORY_CARE_PROVIDER_SITE_OTHER): Payer: Self-pay

## 2014-09-28 ENCOUNTER — Encounter: Payer: Self-pay | Admitting: Internal Medicine

## 2014-09-28 VITALS — BP 118/68 | HR 77 | Temp 97.9°F | Ht 66.0 in | Wt 122.0 lb

## 2014-09-28 DIAGNOSIS — J432 Centrilobular emphysema: Secondary | ICD-10-CM

## 2014-09-28 DIAGNOSIS — R918 Other nonspecific abnormal finding of lung field: Secondary | ICD-10-CM

## 2014-09-28 DIAGNOSIS — E8801 Alpha-1-antitrypsin deficiency: Secondary | ICD-10-CM

## 2014-09-28 DIAGNOSIS — Z148 Genetic carrier of other disease: Secondary | ICD-10-CM

## 2014-09-28 NOTE — Patient Instructions (Signed)
Follow up with Dr. Bethzaida Boord as needed 

## 2014-09-29 NOTE — Assessment & Plan Note (Signed)
Subcentimeter nodules incidentally in this patient on CT scan February 2015, repeat CT scanning on February 2016 with resolution of nodules, most likely due to atelectatic areas. No further workup warranted at this time given her asymptomatic state along with radiographic resolution of nodules.

## 2014-09-29 NOTE — Assessment & Plan Note (Signed)
Centrilobular emphysema noted with patchy fibrotic changes Bases, left greater than right and lobular septal thickening in the lower lobes. Most likely related to being alpha 1 antitrypsin deficiency carrier. Currently asymptomatic-no shortness of breath, no fever, no cough, no weight loss. Close observation and supportive care is warranted at this time 

## 2014-09-29 NOTE — Assessment & Plan Note (Signed)
Patient is a nonsmoker. Repeat CT scan from 3 2016 shows resolution of nodules, most likely these were atelectatic areas that presented as nodules on imaging. No further workup warranted at this time.

## 2014-09-29 NOTE — Assessment & Plan Note (Signed)
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 Close monitoring and observation. Supportive care.

## 2014-09-29 NOTE — Progress Notes (Signed)
MRN# [id number] Tiffany Grimes 12/13/1928   CC:"followup on the lung nodules" Chief Complaint  Patient presents with  . Follow-up    here for XR results; f/u pulm nodule      Brief History: Synopsis: 81 year female seen at [facility] [place] [facility] [place] September 2015 for evaluation of pulmonary nodules. Patient noted to have pulmonary nodules on CT right lower lobe 8 mm left lower lobe 6 mm. Noted to be alpha 1 antitrypsin carrier gene. Repeat CT February 2016 showed nodules no longer exists, most likely previous findings or atelectasis.   Events since last clinic visit: Patient is a pleasant 23 of her presenting today for followup of lung nodule evaluation. At her initial visit she was noted to have subcentimeter nodules in the right lower lobe and left lower lobe from a CT scan in February 2015, at that time she was having shortness of breath and was being evaluated for cardiopulmonary reasons. Since her last visit she's had one ED visit for palpitations and noted to be in atrial fibrillation. Currently she denies any fever, night sweats, chills, weight loss, shortness of breath, dyspnea on exertion, sputum production, cough, rigors, Or hemoptysis. Patient currently has a diagnosis of centrilobular emphysema based on imaging, currently asymptomatic not requiring any inhalers.   PMHX:   Past Medical History  Diagnosis Date  . Thyroid disease   . TIA (transient ischemic attack) June 2012  . TIA (transient ischemic attack) June 2012  . Barrett esophagus     due for EGD July 2012, [place]  . Reflux   . Hx of thyroid nodule July 2012    benign biopsy   . Lentigo    Surgical Hx:  Past Surgical History  Procedure Laterality Date  . Thyroid surgery  june 18    nodule removed  . Hernia repair  2008    left inguinal   Family Hx:  Family History  Problem Relation Age of Onset  . Hypertension Mother   . Stroke Mother   . Heart disease Father 75    AMI  . Hypertension Father   .  Vision loss Paternal Aunt   . Cancer Neg Hx    Social Hx:   History  Substance Use Topics  . Smoking status: Never Smoker   . Smokeless tobacco: Never Used  . Alcohol Use: No   Medication:   Current Outpatient Rx  Name  Route  Sig  Dispense  Refill  . amLODipine (NORVASC) 5 MG tablet   Oral   Take 1 tablet (5 mg total) by mouth daily.   30 tablet   5   . atenolol (TENORMIN) 25 MG tablet      TAKE 1 TABLET TWICE A DAY   180 tablet   1   . atorvastatin (LIPITOR) 20 MG tablet      TAKE 1 TABLET DAILY   90 tablet   1   . brimonidine (ALPHAGAN P) 0.1 % SOLN   Both Eyes   Place 1 drop into both eyes 2 (two) times daily.           . cloNIDine (CATAPRES) 0.1 MG tablet   Oral   Take 1 tablet (0.1 mg total) by mouth 2 (two) times daily. As needed for BP > 150   60 tablet   0   . escitalopram (LEXAPRO) 10 MG tablet      TAKE 1 TABLET (10 MG TOTAL) BY MOUTH DAILY.   30 tablet   2   .  esomeprazole (NEXIUM) 40 MG capsule      TAKE 1 CAPSULE DAILY BEFORE BREAKFAST   90 capsule   2   . fluticasone (FLONASE) 50 MCG/ACT nasal spray   Each Nare   Place 2 sprays into both nostrils daily as needed. 2 sprays into each nostril daily   16 g   0   . latanoprost (XALATAN) 0.005 % ophthalmic solution   Both Eyes   Place 1 drop into both eyes 2 (two) times daily.           Marland Kitchen levothyroxine (SYNTHROID, LEVOTHROID) 50 MCG tablet      TAKE 1 TABLET DAILY   90 tablet   2   . polyethylene glycol powder (GLYCOLAX/MIRALAX) powder      TAKE 17 GRAMS BY MOUTH DAILY.   1581 g   1   . sucralfate (CARAFATE) 1 G tablet      TAKE 1 TABLET TWICE A DAY   180 tablet   1   . Tdap (BOOSTRIX) 5-2.5-18.5 LF-MCG/0.5 injection   Intramuscular   Inject 0.5 mLs into the muscle once.   0.5 mL   0   . XARELTO 15 MG TABS tablet      TAKE 1 TABLET DAILY   90 tablet   0      Review of Systems: Gen:  Denies  fever, sweats, chills HEENT: Denies blurred vision, double vision,  ear pain, eye pain, hearing loss, nose bleeds, sore throat Cvc:  No dizziness, chest pain or heaviness Resp:   Denies cough or sputum porduction, shortness of breath Gi: Denies swallowing difficulty, stomach pain, nausea or vomiting, diarrhea, constipation, bowel incontinence Gu:  Denies bladder incontinence, burning urine Ext:   No Joint pain, stiffness or swelling Skin: No skin rash, easy bruising or bleeding or hives Endoc:  No polyuria, polydipsia , polyphagia or weight change Psych: No depression, insomnia or hallucinations  Other:  All other systems negative  Allergies:  Neomycin  Physical Examination:  VS: BP 118/68 mmHg  Pulse 77  Temp(Src) 97.9 F (36.6 C) (Oral)  Ht  (1.676 m)  Wt 122 lb (55.339 kg)  BMI 19.70 kg/m2  SpO2 96%  General Appearance: No distress  HEENT: PERRLA, EOM intact, no ptosis, no other lesions noticed Pulmonary:Exam: normal breath sounds., diaphragmatic excursion normal.No wheezing, No rales   Cardiovascular:@ Exam:  Normal S1,S2.  No m/r/g.     Abdomen:Exam: Benign, Soft, non-tender, No masses  Skin:   warm, no rashes, no ecchymosis  Extremities: normal, no cyanosis, clubbing, no edema, warm with normal capillary refill.   Labs results:  BMP Lab Results  Component Value Date   NA 135 06/06/2014   K 4.4 06/06/2014   CL 102 06/06/2014   CO2 28 06/06/2014   GLUCOSE 104* 06/06/2014   BUN 17 06/06/2014   CREATININE 1.0 06/06/2014     CBC CBC Latest Ref Rng 06/30/2013  WBC 4.5 - 10.5 K/uL 7.1  Hemoglobin 12.0 - 15.0 g/dL 16.1  Hematocrit 09.6 - 46.0 % 40.9  Platelets 150.0 - 400.0 K/uL 208.0     Rad results: (The following images and results were reviewed by Dr. Dema Severin). CT Chest 08/29/14 FINDINGS: No axillary, hilar or mediastinal lymphadenopathy is identified. There is cardiomegaly. Calcific aortic and coronary atherosclerosis is identified. No pleural or pericardial effusion.  Two nodular opacities in the right lower lobe seen  on the prior examination are normal are no longer present. These were likely due to atelectasis. No nodule,  mass or consolidative process is seen.  Densely imaged upper abdomen demonstrates no focal abnormality. No lytic or sclerotic bony lesion is seen.   IMPRESSION: Previously described right lower lobe nodules are no longer appreciated and were likely due to atelectasis.  Cardiomegaly.  Calcific coronary artery disease.  CT Chest 08/28/13 FINDINGS: There is no demonstrable thoracic aortic aneurysm or dissection. Great vessels appear patent and normal. Note that there is a common origin of the left and right common carotid arteries, an anatomic variant. There is no apparent thoracic aortic ulceration. There is mild atherosclerotic change in the aorta. There are multiple foci of coronary artery calcification. Heart is borderline enlarged venous not thickened. There is no demonstrable pulmonary embolus.  There is underlying centrilobular emphysema. There is mild lower lobe bronchiectatic change. There are multiple areas of lobular septal thickening in the lower lobes. There is patchy fibrotic change in the lung bases, more on the left than on the right. There is no well-defined airspace consolidation.  On axial slice 35, series 3, there is a 6 mm nodular opacity in the superior segment of the left lower lobe. On axial slice 30, there is a focal 8 mm nodular opacity in the superior segment right lower lobe. On this same slice, there is a 6 mm nodular opacity in the superior segment right lower lobe.  There is no appreciable thoracic adenopathy.  Visualized upper abdominal structures appear unremarkable. There are no appreciable blastic or lytic bone lesions. There are several small nodular opacities in the thyroid. Right lobe of the thyroid is virtually aplastic.   IMPRESSION: No thoracic aortic aneurysm or dissection. No demonstrable pulmonary embolus.  There is  atherosclerotic change. Note that there are multiple coronary artery calcifications.  There is underlying emphysema with areas of fibrotic change in the lower lobes, more on the left than on the right. No airspace consolidation.  There are several nodular opacities in the right lower lobe, largest measuring 8 mm. Followup of these nodular opacity should be based on Fleischner Society guidelines. If the patient is at high risk for bronchogenic carcinoma, follow-up chest CT at 3-80months is recommended. If the patient is at low risk for bronchogenic carcinoma, follow-up chest CT at 6-12 months is recommended. This recommendation follows the consensus statement: Guidelines for Management of Small Pulmonary Nodules Detected on CT Scans: A Statement from the Fleischner Society as published in Radiology 2005; 237:395-400.  Nodular opacities in thyroid. Nonemergent thyroid ultrasound to further assess may be advisable   Assessment and Plan:atrial female past medical history of centrilobular emphysema, pulmonary nodules, hypertension, following up for pulmonary nodules. Multiple pulmonary nodules Patient is a nonsmoker. Repeat CT scan from 3 2016 shows resolution of nodules, most likely these were atelectatic areas that presented as nodules on imaging. No further workup warranted at this time.       Centrilobular emphysema Centrilobular emphysema noted with patchy fibrotic changes Bases, left greater than right and lobular septal thickening in the lower lobes. Most likely related to being alpha 1 antitrypsin deficiency carrier. Currently asymptomatic-no shortness of breath, no fever, no cough, no weight loss. Close observation and supportive care is warranted at this time     Alpha-1-antitrypsin deficiency carrier Single allele (Z) carrier - asymptomatic Does not meet criteria at this time for alpha-1 antitrypsin replacement therapy. Close monitoring and observation. Supportive  care.      Pulmonary nodules/lesions, multiple Subcentimeter nodules incidentally in this patient on CT scan February 2015, repeat CT scanning on February  2016 with resolution of nodules, most likely due to atelectatic areas. No further workup warranted at this time given her asymptomatic state along with radiographic resolution of nodules.     Updated Medication List Outpatient Encounter Prescriptions as of 09/28/2014  Medication Sig  . amLODipine (NORVASC) 5 MG tablet Take 1 tablet (5 mg total) by mouth daily.  Marland Kitchen atenolol (TENORMIN) 25 MG tablet TAKE 1 TABLET TWICE A DAY  . atorvastatin (LIPITOR) 20 MG tablet TAKE 1 TABLET DAILY  . brimonidine (ALPHAGAN P) 0.1 % SOLN Place 1 drop into both eyes 2 (two) times daily.    . cloNIDine (CATAPRES) 0.1 MG tablet Take 1 tablet (0.1 mg total) by mouth 2 (two) times daily. As needed for BP > 150  . escitalopram (LEXAPRO) 10 MG tablet TAKE 1 TABLET (10 MG TOTAL) BY MOUTH DAILY.  Marland Kitchen esomeprazole (NEXIUM) 40 MG capsule TAKE 1 CAPSULE DAILY BEFORE BREAKFAST  . fluticasone (FLONASE) 50 MCG/ACT nasal spray Place 2 sprays into both nostrils daily as needed. 2 sprays into each nostril daily  . latanoprost (XALATAN) 0.005 % ophthalmic solution Place 1 drop into both eyes 2 (two) times daily.    Marland Kitchen levothyroxine (SYNTHROID, LEVOTHROID) 50 MCG tablet TAKE 1 TABLET DAILY  . polyethylene glycol powder (GLYCOLAX/MIRALAX) powder TAKE 17 GRAMS BY MOUTH DAILY.  Marland Kitchen sucralfate (CARAFATE) 1 G tablet TAKE 1 TABLET TWICE A DAY  . Tdap (BOOSTRIX) 5-2.5-18.5 LF-MCG/0.5 injection Inject 0.5 mLs into the muscle once.  Carlena Hurl 15 MG TABS tablet TAKE 1 TABLET DAILY    Orders for this visit: No orders of the defined types were placed in this encounter.    Thank  you for the visitation and for allowing  [facility] to assist in the care of your patient. Our recommendations are noted above.  Please contact us if we can be of further service.  Stephanie Acre, MD [facility] Office Number: [phone]

## 2014-10-12 ENCOUNTER — Other Ambulatory Visit: Payer: Self-pay | Admitting: Internal Medicine

## 2014-10-18 ENCOUNTER — Other Ambulatory Visit: Payer: Self-pay | Admitting: Internal Medicine

## 2014-10-20 ENCOUNTER — Ambulatory Visit: Payer: Self-pay | Admitting: Internal Medicine

## 2014-10-25 ENCOUNTER — Ambulatory Visit
Admit: 2014-10-25 | Disposition: A | Discharge status: Home / Self Care | Payer: Self-pay | Admitting: Orthopedic Surgery

## 2014-10-25 LAB — CBC
HCT: 38.2 % (ref 35.0–47.0)
HGB: 12.5 g/dL (ref 12.0–16.0)
MCH: 27.8 pg (ref 26.0–34.0)
MCHC: 32.6 g/dL (ref 32.0–36.0)
MCV: 85 fL (ref 80–100)
Platelet: 179 10*3/uL (ref 150–440)
RBC: 4.49 10*6/uL (ref 3.80–5.20)
RDW: 14.3 % (ref 11.5–14.5)
WBC: 8 10*3/uL (ref 3.6–11.0)

## 2014-10-25 LAB — BASIC METABOLIC PANEL
Anion Gap: 8 (ref 7–16)
BUN: 17 mg/dL
CALCIUM: 9.2 mg/dL
CO2: 29 mmol/L
Chloride: 98 mmol/L — ABNORMAL LOW
Creatinine: 0.88 mg/dL
EGFR (African American): >60
EGFR (Non-African Amer.): >60
Glucose: 110 mg/dL — ABNORMAL HIGH
POTASSIUM: 4 mmol/L
SODIUM: 135 mmol/L

## 2014-10-27 ENCOUNTER — Ambulatory Visit
Admit: 2014-10-27 | Disposition: A | Discharge status: Home / Self Care | Payer: Self-pay | Attending: Orthopedic Surgery | Admitting: Orthopedic Surgery

## 2014-10-31 ENCOUNTER — Ambulatory Visit: Payer: Self-pay | Admitting: Internal Medicine

## 2014-11-04 ENCOUNTER — Telehealth: Payer: Self-pay | Admitting: Internal Medicine

## 2014-11-04 ENCOUNTER — Ambulatory Visit (INDEPENDENT_AMBULATORY_CARE_PROVIDER_SITE_OTHER): Payer: Medicare Other | Admitting: Internal Medicine

## 2014-11-04 DIAGNOSIS — I10 Essential (primary) hypertension: Secondary | ICD-10-CM

## 2014-11-04 NOTE — Progress Notes (Signed)
Patient ID: Tiffany Grimes, female   DOB: 1929-05-23, 81 y.o.   MRN: [id number]  Patient failed to show for a 30 minute  follow up  On hypertension and dementia

## 2014-11-04 NOTE — Telephone Encounter (Signed)
Pt came in at 2 pm. Pt was advised that her app for at 11am and that she will need to reschedule. Pt stated that she told her husband that her appt was at 1pm. Pt has been rescheduled.msn

## 2014-11-06 NOTE — Assessment & Plan Note (Signed)
Patient no showed

## 2014-11-08 ENCOUNTER — Ambulatory Visit (INDEPENDENT_AMBULATORY_CARE_PROVIDER_SITE_OTHER): Payer: Medicare Other | Admitting: Internal Medicine

## 2014-11-08 ENCOUNTER — Encounter: Payer: Self-pay | Admitting: Internal Medicine

## 2014-11-08 VITALS — BP 122/60 | HR 67 | Temp 98.1°F | Resp 16 | Ht 65.0 in | Wt 120.8 lb

## 2014-11-08 DIAGNOSIS — I10 Essential (primary) hypertension: Secondary | ICD-10-CM

## 2014-11-08 DIAGNOSIS — R748 Abnormal levels of other serum enzymes: Secondary | ICD-10-CM

## 2014-11-08 DIAGNOSIS — F03918 Unspecified dementia, unspecified severity, with other behavioral disturbance: Secondary | ICD-10-CM

## 2014-11-08 DIAGNOSIS — F0391 Unspecified dementia with behavioral disturbance: Secondary | ICD-10-CM

## 2014-11-08 DIAGNOSIS — S62101A Fracture of unspecified carpal bone, right wrist, initial encounter for closed fracture: Secondary | ICD-10-CM

## 2014-11-08 DIAGNOSIS — M810 Age-related osteoporosis without current pathological fracture: Secondary | ICD-10-CM | POA: Diagnosis not present

## 2014-11-08 DIAGNOSIS — Z13820 Encounter for screening for osteoporosis: Secondary | ICD-10-CM

## 2014-11-08 DIAGNOSIS — Z7901 Long term (current) use of anticoagulants: Secondary | ICD-10-CM

## 2014-11-08 DIAGNOSIS — Z79899 Other long term (current) drug therapy: Secondary | ICD-10-CM | POA: Diagnosis not present

## 2014-11-08 LAB — COMPREHENSIVE METABOLIC PANEL
ALBUMIN: 4.3 g/dL (ref 3.5–5.2)
ALT: 14 U/L (ref 0–35)
AST: 19 U/L (ref 0–37)
Alkaline Phosphatase: 131 U/L — ABNORMAL HIGH (ref 39–117)
BILIRUBIN TOTAL: 0.6 mg/dL (ref 0.2–1.2)
BUN: 13 mg/dL (ref 6–23)
CALCIUM: 9.2 mg/dL (ref 8.4–10.5)
CO2: 33 mEq/L — ABNORMAL HIGH (ref 19–32)
CREATININE: 1.04 mg/dL (ref 0.40–1.20)
Chloride: 97 mEq/L (ref 96–112)
GFR: 53.66 mL/min — AB (ref >60.00)
GLUCOSE: 123 mg/dL — AB (ref 70–99)
Potassium: 4 mEq/L (ref 3.5–5.1)
Sodium: 135 mEq/L (ref 135–145)
TOTAL PROTEIN: 6.3 g/dL (ref 6.0–8.3)

## 2014-11-08 LAB — VITAMIN D 25 HYDROXY (VIT D DEFICIENCY, FRACTURES): VITD: 41.23 ng/mL (ref 30.00–100.00)

## 2014-11-08 MED ORDER — RIVAROXABAN 15 MG PO TABS: 15.0000 mg | ORAL_TABLET | Freq: Every day | ORAL | Status: DC

## 2014-11-08 NOTE — Progress Notes (Signed)
Pre-visit discussion using our clinic review tool. No additional management support is needed unless otherwise documented below in the visit note.  

## 2014-11-08 NOTE — Patient Instructions (Addendum)
Your blood pressure may be high in the evenings due to wrist pain, or due to taking the atenolol too late  I am not changing the atenolol evening  dose to 25  mg ,  But I want you to Take it at 7 pm   Continue 25 mg in the  Morning    Continue 5 mg amlodipine  in the morning   save the clonidinefor any elevations of systolic BP (top number) > 150)  Repeat measurement in one week .  If it is still high in the evening then we will increase the atenolol dose   Bone Density test is needed .  i HAVE ORDERED IT
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
## 2014-11-10 ENCOUNTER — Encounter: Payer: Self-pay | Admitting: Internal Medicine

## 2014-11-10 NOTE — Assessment & Plan Note (Addendum)
With recent radial comminuted fracture of right  radius requiring ORIF with screws.   checking Vit D level.  DEXA ordered,  Will recommend Prolia given age and history of esophagitis.

## 2014-11-10 NOTE — Assessment & Plan Note (Signed)
Recommended alteration of dose schedule for atenolol .  No changes to dosing.
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
## 2014-11-11 ENCOUNTER — Encounter: Payer: Self-pay | Admitting: *Deleted

## 2014-11-11 NOTE — Consult Note (Signed)
Brief Consult Note: Diagnosis: AFIB Angina CP.   Patient was seen by consultant.   Recommend to proceed with surgery or procedure.   Recommend further assessment or treatment.   Orders entered.   Discussed with Attending MD.   Comments: IMP CP Angina AFIB HTN Hyperlipidemia GERD CRI TIA Pul HTN . PLAN ROMI Tele Xarelto 15 mg daily Rate control with Atenolol 25 mg po bid Agree with Myoview for angina If myoview ok d/c home with f/u as outpt with BK Continue GERD therapy withNexium/Sucrafate F/U nephology for CRI Agree with lipitor for hyperlipidemia I am doubtful that this is cardiac so medical therapy for now.  Electronic Signatures: Dorothyann Peng D (MD)  (Signed 03-Jul-14 16:00)  Authored: Brief Consult Note   Last Updated: 03-Jul-14 16:00 by Dorothyann Peng D (MD)

## 2014-11-11 NOTE — Discharge Summary (Signed)
PATIENT NAME:  Tiffany Grimes, Tiffany Grimes MR#:  [id number] DATE OF BIRTH:  August 11, 1928  DATE OF ADMISSION:  01/20/2013 DATE OF DISCHARGE:  01/21/2013  PRESENTING COMPLAINT: Chest pain.   DISCHARGE DIAGNOSES:  1. Chest pain, ruled out for acute myocardial infarction.  2. Uncontrolled hypertension.  3. Chronic atrial fibrillation, on Xarelto.   CONDITION ON DISCHARGE: Fair.   PROCEDURE: Myoview stress test within normal limits.   MEDICATIONS:  1. Nexium 40 mg daily.  2. Atenolol 25 mg b.i.d.  3. Sucralfate 1 g b.i.d.  4. Aspirin 81 mg daily.  5. Atorvastatin 20 mg at bedtime.  6. Brimonidine ophthalmic 0.1% one drop to each eye b.i.d.  7. Latanoprost 0.005% ophthalmic solution 1 drop to each eye b.i.d.  8. Levothyroxine 50 mcg daily.  9. Xarelto 15 mg daily.  10. Lisinopril 5 mg daily. This is a new medication.   DIET: Low-sodium diet.   FOLLOWUP:  1. Followup with Dr. Gwen Pounds in 1 to 2 weeks.  2. Follow up with Dr. Darrick Huntsman in 1 to 2 weeks.   CONSULTATION: Cardiology, Dr. Juliann Pares.   LABORATORY, RADIOLOGY AND DIAGNOSTIC:  Ultrasound of the abdomen: No acute findings. Normal gallbladder.   Myoview stress test: Left ventricular global function was normal. Myocardial perfusion scan showed no evidence of pathology and has normal appearance. EF of 55%.   Cardiac enzymes x3 negative. Lipid profile within normal limits.   CT angiography of the chest and abdomen: No acute abnormality of the aorta.   UA negative for UTI.   Comprehensive metabolic panel within normal limits.   CBC within normal limits.   TSH is 1.94.   EKG showed Afib.    BRIEF SUMMARY OF HOSPITAL COURSE: Ms. Sergent is an 81 year old Caucasian female with history of chronic Afib on Xarelto, hypertension. Comes in with:  1. Possible unstable angina. The patient does not have a history of CAD. Could be due to elevated BP. Serial cardiac enzymes were negative. Myoview stress test was negative. CT angio of the abdomen and chest  negative for dissection. The patient's symptoms resolved.  2. Chronic Afib on Xarelto. Heart rate stable. Continued atenolol b.i.d.  3. Uncontrolled hypertension. Resumed home medication which is atenolol. I added a small dose of lisinopril to have better control of blood pressure.  4. Epigastric pain with known history of Barrett's esophagus. PPI was continued. Ultrasound of abdomen negative for gallbladder disease.  5. Hyperthyroidism. Continued Synthroid.   Hospital stay remained stable. The patient remained a FULL CODE.   TIME SPENT: 40 minutes.    ____________________________ Wylie Hail Allena Katz, MD sap:gb D: 01/22/2013 06:53:02 ET T: 01/22/2013 08:37:52 ET JOB#: [id number]  cc: Jaleyah Longhi A. Allena Katz, MD, <Dictator> Lamar Blinks, MD Duncan Dull, MD Dwayne D. Juliann Pares, MD  Willow Ora MD ELECTRONICALLY SIGNED 02/08/2013 18:57

## 2014-11-11 NOTE — H&P (Signed)
PATIENT NAME:  Tiffany Grimes, Tiffany Grimes MR#:  [id number] DATE OF BIRTH:  07-30-1928  DATE OF ADMISSION:  01/20/2013  PRIMARY CARE PHYSICIAN: Dr. Duncan Dull.   REQUESTING PHYSICIAN: Dr. Margarita Grizzle.    CHIEF COMPLAINT: Atypical chest pain/epigastric pain with shortness of breath.   HISTORY OF PRESENT ILLNESS: The patient is an 81 year old female with a known history of Afib on Xarelto, GERD, Barrett's esophagus and hypothyroid who is being admitted for possible unstable angina. The patient started having epigastric pain after lunch around 1:30. She felt it might be just indigestion, but she was worried about her heart as she does have irregular heart rhythm and decided to come to the Emergency Department as she was still feeling shortness of breath which lasted about 20 minutes. While in the ED, she was found to have blood pressure of 191/109, and considering her risk factors, she is being admitted to rule out cardiac condition.   PAST MEDICAL HISTORY:  1. GERD.  2. Hypothyroidism.  3. Hypertension.  4. Glaucoma.   PAST SURGICAL HISTORY:  1. Hernia repair.  2. Thyroid biopsy.  3. C-section.  4. Total hysterectomy.   SOCIAL HISTORY: Nonsmoker. No alcohol. 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   ALLERGIES: NEOMYCIN.   HOME MEDICATIONS:  1. Aspirin 81 mg p.o. daily.  2. Atenolol 25 mg p.o. b.i.d.  3. Atorvastatin 20 mg p.o. daily.  4. Brimonidine eyedrops to each eye twice a day.  5. Latanoprost 0.005% ophthalmic drop to each eye twice a day.  6. Levothyroxine 50 mcg p.o. daily.  7. Nexium 40 mg p.o. daily.  8. Sucralfate 1 g p.o. b.i.d.  9. Xarelto 15 mg p.o. daily.   FAMILY HISTORY: Mother's side had extensive diabetes, heart disease and stroke.   REVIEW OF SYSTEMS:  CONSTITUTIONAL: No fever, fatigue, weakness.  EYES: No blurred or double vision.  ENT: No tinnitus or ear pain.  RESPIRATORY: No cough, wheezing, hemoptysis.  CARDIOVASCULAR: Positive for shortness of breath and some  epigastric discomfort.  GASTROINTESTINAL: No nausea, vomiting, diarrhea. Positive for epigastric pain.  GENITOURINARY: No dysuria or hematuria.  ENDOCRINE: No polyuria or nocturia. Positive for hypothyroidism.  HEMATOLOGY: No anemia or easy bruising.  SKIN: No rash or lesion.  MUSCULOSKELETAL: No arthritis or muscle cramp.  NEUROLOGIC: No tingling, numbness, weakness.  PSYCHIATRIC: No history of anxiety or depression.   PHYSICAL EXAMINATION:  VITAL SIGNS: Temperature 97.9, heart rate 95 per minute, respirations 18 per minute, blood pressure 191/109 mmHg. She was saturating 91% on room air.  GENERAL: The patient is an 81 year old female lying in the bed comfortably without any acute distress.  EYES: Pupils equal, round, reactive to light and accommodation. No scleral icterus. Extraocular muscles intact.  HENT: Head atraumatic, normocephalic. Oropharynx and nasopharynx clear.  NECK: Supple. No jugular venous distention. No thyroid enlargement. No tenderness.  LUNGS: Clear to auscultation bilaterally. No wheezing, rales, rhonchi or crepitation.  CARDIOVASCULAR: Irregularly irregular heart sounds. No murmurs, rubs or gallops.  ABDOMEN: Soft, nontender, nondistended. Bowel sounds present. No organomegaly or masses.  EXTREMITIES: No pedal edema, cyanosis or clubbing.  NEUROLOGIC: Nonfocal examination. Cranial nerves II through XII intact. Muscle strength 5/5. Extremity sensation intact.  PSYCHIATRIC: The patient is alert and oriented x3. Might have some anxiety component to it.  SKIN: No obvious rash, lesion or ulcer.    LABORATORY PANEL: UA on admission was negative.   CBC within normal limits. Two sets of cardiac enzymes being negative. Normal liver function tests. Normal BMP.  CT scan of the chest and abdomen with angiogram showed no acute abnormality.   EKG showed atrial fibrillation with LVH, rate of 89 per minute. No major ST-T changes.   IMPRESSION AND PLAN:  1. Possible unstable  angina: Will do serial cardiac enzymes. Obtain Myoview in the morning. Get a cardiology consultation as Dr. Gwen Pounds knows her well. Will monitor her on telemetry.  2. Shortness of breath: She had a recent echocardiogram in May 2004 when she had an ejection fraction of 60% reported. Will have cardiology evaluation. Unsure etiology at this time.  3. Uncontrolled hypertension: Will resume home medication. Add lisinopril and adjust medication as needed.  4. Epigastric pain with a known history of Barrett's esophagus: Will continue proton pump inhibitor and sucralfate. Will order abdominal ultrasound. Her CT is already negative.  5. Hypothyroidism: Will continue Synthroid. Her TSH will be checked.   CODE STATUS: FULL CODE.   TOTAL TIME TAKING CARE OF THIS PATIENT: 55 minutes.   ____________________________ Ellamae Sia. Sherryll Burger, MD vss:gb D: 01/20/2013 20:57:19 ET T: 01/20/2013 22:06:50 ET JOB#: [id number]  cc: Rhilynn Preyer S. Sherryll Burger, MD, <Dictator> Duncan Dull, MD Lamar Blinks, MD Ellamae Sia [facility] MD ELECTRONICALLY SIGNED 01/21/2013 16:11

## 2014-11-16 ENCOUNTER — Other Ambulatory Visit: Payer: Self-pay | Admitting: Internal Medicine

## 2014-11-17 ENCOUNTER — Telehealth: Payer: Self-pay | Admitting: Internal Medicine

## 2014-11-17 NOTE — Telephone Encounter (Signed)
Her blood pressures are excellent,  Im glad she has more energy

## 2014-11-18 NOTE — Telephone Encounter (Signed)
Left message for patient to return call  To office. 

## 2014-11-18 NOTE — Telephone Encounter (Signed)
Patient notified and voiced understanding.

## 2014-11-20 NOTE — Op Note (Signed)
PATIENT NAME:  Tiffany Grimes, Tiffany Grimes MR#:  [id number] DATE OF BIRTH:  February 09, 1929  DATE OF PROCEDURE:  10/27/2014  PREOPERATIVE DIAGNOSIS: Comminuted intra-articular fracture of the distal radius, more than three fragments.   POSTOPERATIVE DIAGNOSIS: Comminuted intra-articular fracture of the distal radius, more than three fragments.   PROCEDURE: Open reduction and internal fixation of distal radius.   ANESTHESIA: General.   SURGEON: Kennedy Bucker, MD.   DESCRIPTION OF PROCEDURE: The patient was brought to the Operating Room and after adequate anesthesia was obtained, the right arm was prepped and draped in the usual sterile fashion with a traction table applied. After patient identification and timeout procedure completed, 10 pounds of traction was applied to the index and long fingers. After patient identification and timeout procedure completed, the tourniquet was raised to 250 mmHg. Volar approach was made directly over the FCR tendon. The FCR tendon was retracted radially. Deep fascia incised, protecting the radial artery and associated veins. The pronator was identified and elevated off the distal radius from its radial attachment. Distal first repair was applied with multiple smooth pegs through the short narrow DVR plate. The proximal screws were then filled using standard technique, drilling, measuring, and placing the cortical screws. Three 10 mm screws inserted. This gave essentially anatomic alignment without pin penetration of the joint. The tourniquet was let down at this point and there was minimal bleeding. The wound was closed with 3-0 Vicryl subcutaneously, 4-0 nylon for the skin. Xeroform, 4 x 4's, Webril and Ace wrap with a volar splint applied. The patient was sent to the recovery room in stable condition.   ESTIMATED BLOOD LOSS: Minimal.   COMPLICATIONS: None.   SPECIMEN: None.   TOURNIQUET TIME: 22 minutes at 250 mmHg.   IMPLANTS: Short narrow DVR plate from [employer] with multiple  screws and smooth pegs.   ____________________________ Leitha Schuller, MD mjm:AT D: 10/27/2014 22:34:30 ET T: 10/28/2014 08:22:07 ET JOB#: [id number]  cc: Leitha Schuller, MD, <Dictator> Leitha Schuller MD ELECTRONICALLY SIGNED 10/29/2014 22:25

## 2014-11-24 ENCOUNTER — Ambulatory Visit: Payer: Medicare Other

## 2014-11-28 ENCOUNTER — Ambulatory Visit
Admission: RE | Admit: 2014-11-28 | Discharge: 2014-11-28 | Disposition: A | Discharge status: Home / Self Care | Payer: Medicare Other | Source: Ambulatory Visit | Attending: Internal Medicine | Admitting: Internal Medicine

## 2014-11-28 DIAGNOSIS — Z13820 Encounter for screening for osteoporosis: Secondary | ICD-10-CM | POA: Diagnosis present

## 2014-11-28 DIAGNOSIS — M810 Age-related osteoporosis without current pathological fracture: Secondary | ICD-10-CM | POA: Insufficient documentation

## 2014-11-30 ENCOUNTER — Other Ambulatory Visit: Payer: Self-pay | Admitting: Internal Medicine

## 2014-12-07 ENCOUNTER — Ambulatory Visit (INDEPENDENT_AMBULATORY_CARE_PROVIDER_SITE_OTHER): Payer: Medicare Other | Admitting: Internal Medicine

## 2014-12-07 ENCOUNTER — Emergency Department: Payer: Medicare Other

## 2014-12-07 ENCOUNTER — Emergency Department
Admission: EM | Admit: 2014-12-07 | Discharge: 2014-12-07 | Disposition: A | Discharge status: Home / Self Care | Payer: Medicare Other | Attending: Emergency Medicine | Admitting: Emergency Medicine

## 2014-12-07 ENCOUNTER — Encounter: Payer: Self-pay | Admitting: Internal Medicine

## 2014-12-07 ENCOUNTER — Encounter: Payer: Self-pay | Admitting: Urgent Care

## 2014-12-07 ENCOUNTER — Other Ambulatory Visit: Payer: Self-pay

## 2014-12-07 VITALS — BP 104/64 | HR 76 | Temp 97.3°F | Resp 14 | Ht 65.0 in | Wt 122.5 lb

## 2014-12-07 DIAGNOSIS — R200 Anesthesia of skin: Secondary | ICD-10-CM | POA: Diagnosis present

## 2014-12-07 DIAGNOSIS — Z7901 Long term (current) use of anticoagulants: Secondary | ICD-10-CM | POA: Diagnosis not present

## 2014-12-07 DIAGNOSIS — M810 Age-related osteoporosis without current pathological fracture: Secondary | ICD-10-CM

## 2014-12-07 DIAGNOSIS — R202 Paresthesia of skin: Secondary | ICD-10-CM | POA: Insufficient documentation

## 2014-12-07 DIAGNOSIS — I10 Essential (primary) hypertension: Secondary | ICD-10-CM

## 2014-12-07 DIAGNOSIS — G458 Other transient cerebral ischemic attacks and related syndromes: Secondary | ICD-10-CM | POA: Diagnosis not present

## 2014-12-07 DIAGNOSIS — Z79899 Other long term (current) drug therapy: Secondary | ICD-10-CM | POA: Diagnosis not present

## 2014-12-07 DIAGNOSIS — R002 Palpitations: Secondary | ICD-10-CM | POA: Diagnosis not present

## 2014-12-07 HISTORY — DX: Essential (primary) hypertension: I10

## 2014-12-07 LAB — BASIC METABOLIC PANEL
ANION GAP: 7 (ref 5–15)
BUN: 18 mg/dL (ref 6–20)
CALCIUM: 9.1 mg/dL (ref 8.9–10.3)
CO2: 29 mmol/L (ref 22–32)
CREATININE: 1.02 mg/dL — AB (ref 0.44–1.00)
Chloride: 102 mmol/L (ref 101–111)
GFR calc Af Amer: 57 mL/min — ABNORMAL LOW (ref >60)
GFR, EST NON AFRICAN AMERICAN: 49 mL/min — AB (ref >60)
Glucose, Bld: 124 mg/dL — ABNORMAL HIGH (ref 65–99)
Potassium: 3.9 mmol/L (ref 3.5–5.1)
SODIUM: 138 mmol/L (ref 135–145)

## 2014-12-07 LAB — CBC
HCT: 41.5 % (ref 35.0–47.0)
Hemoglobin: 13.2 g/dL (ref 12.0–16.0)
MCH: 27.4 pg (ref 26.0–34.0)
MCHC: 31.8 g/dL — ABNORMAL LOW (ref 32.0–36.0)
MCV: 86.1 fL (ref 80.0–100.0)
Platelets: 207 10*3/uL (ref 150–440)
RBC: 4.82 MIL/uL (ref 3.80–5.20)
RDW: 13.9 % (ref 11.5–14.5)
WBC: 5 10*3/uL (ref 3.6–11.0)

## 2014-12-07 LAB — TROPONIN I: Troponin I: <0.03 ng/mL (ref <0.031)

## 2014-12-07 NOTE — ED Provider Notes (Addendum)
[facility] Emergency Department Provider Note     Time seen: 7:10 AM  I have reviewed the triage vital signs and the nursing notes.   HISTORY  Chief Complaint Numbness and Palpitations    HPI Tiffany Grimes is a 81 y.o. female who presents ER for transient left upper extremity numbness. Patient states she woke up she was weight for about an hour when she realized that her left arm was numb. She states she reached over the side of bed and shook it and sensation was restored. This does not happen before or since. She had normal movement and motor function everywhere and particularly in the left arm and this occurred. Denies any balance trouble speech trouble vision trouble. She currently takes blood thinners. No recent illnesses.    Past Medical History  Diagnosis Date  . Thyroid disease   . TIA (transient ischemic attack) June 2012  . TIA (transient ischemic attack) June 2012  . Barrett esophagus     due for EGD July 2012, [place]  . Reflux   . Hx of thyroid nodule July 2012    benign biopsy   . Lentigo   . Hypertension     Patient Active Problem List   Diagnosis Date Noted  . Centrilobular emphysema 04/18/2014  . Alpha-1-antitrypsin deficiency carrier 04/18/2014  . Unspecified hypothyroidism 04/07/2014  . Multiple pulmonary nodules 10/05/2013  . Pulmonary nodules/lesions, multiple 08/31/2013  . PAD (peripheral artery disease) 08/08/2013  . External hemorrhoid 08/02/2013  . Anal fissure 08/02/2013  . Pain in inferior right lower extremity 05/21/2013  . Ecchymosis 05/10/2013  . Anxiety state 10/28/2012  . Routine general medical examination at a health care facility 06/15/2012  . 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 Osteoporosis, post-menopausal 02/11/2012  . Lumbago with sciatica of right side 02/11/2012  . Posterior neck pain 02/11/2012  . Hyperlipidemia LDL goal <100 12/16/2011  . Hx of thyroid nodule   . Atrial fibrillation,  new onset 08/02/2011  . Chest pain, atypical 08/02/2011  . TIA (transient ischemic attack)   . Barrett esophagus   . Hypertension 03/29/2011    Past Surgical History  Procedure Laterality Date  . Thyroid surgery  june 18    nodule removed  . Hernia repair  2008    left inguinal    Current Outpatient Rx  Name  Route  Sig  Dispense  Refill  . amLODipine (NORVASC) 5 MG tablet   Oral   Take 1 tablet (5 mg total) by mouth daily.   30 tablet   5   . atenolol (TENORMIN) 25 MG tablet      TAKE 1 TABLET TWICE A DAY   180 tablet   1   . atorvastatin (LIPITOR) 20 MG tablet      TAKE 1 TABLET DAILY   90 tablet   1   . brimonidine (ALPHAGAN P) 0.1 % SOLN   Both Eyes   Place 1 drop into both eyes 2 (two) times daily.           . cloNIDine (CATAPRES) 0.1 MG tablet   Oral   Take 1 tablet (0.1 mg total) by mouth 2 (two) times daily. As needed for BP > 150   60 tablet   0   . escitalopram (LEXAPRO) 10 MG tablet      TAKE 1 TABLET (10 MG TOTAL) BY MOUTH DAILY.   30 tablet   2   . esomeprazole (NEXIUM) 40 MG capsule  TAKE 1 CAPSULE DAILY BEFORE BREAKFAST   90 capsule   2   . fluticasone (FLONASE) 50 MCG/ACT nasal spray   Each Nare   Place 2 sprays into both nostrils daily as needed. 2 sprays into each nostril daily   16 g   0   . latanoprost (XALATAN) 0.005 % ophthalmic solution   Both Eyes   Place 1 drop into both eyes 2 (two) times daily.           Marland Kitchen levothyroxine (SYNTHROID, LEVOTHROID) 50 MCG tablet      TAKE 1 TABLET DAILY   90 tablet   1   . polyethylene glycol powder (GLYCOLAX/MIRALAX) powder      TAKE 17 GRAMS BY MOUTH DAILY.   1581 g   1   . Rivaroxaban (XARELTO) 15 MG TABS tablet   Oral   Take 1 tablet (15 mg total) by mouth daily.   90 tablet   3   . sucralfate (CARAFATE) 1 G tablet      TAKE 1 TABLET TWICE A DAY   180 tablet   0   . Tdap (BOOSTRIX) 5-2.5-18.5 LF-MCG/0.5 injection   Intramuscular   Inject 0.5 mLs into the  muscle once.   0.5 mL   0     Allergies Neomycin  Family History  Problem Relation Age of Onset  . Hypertension Mother   . Stroke Mother   . Heart disease Father 39    AMI  . Hypertension Father   . Vision loss Paternal Aunt   . Cancer Neg Hx     Social History History  Substance Use Topics  . Smoking status: Never Smoker   . Smokeless tobacco: Never Used  . Alcohol Use: No    Review of Systems Constitutional: Negative for fever. Eyes: Negative for visual changes. ENT: Negative for sore throat. Cardiovascular: Negative for chest pain. Respiratory: Negative for shortness of breath. Gastrointestinal: Negative for abdominal pain, vomiting and diarrhea. Genitourinary: Negative for dysuria. Musculoskeletal: Negative for back pain. Skin: Negative for rash. Neurological: Negative for headaches, positive for focal numbness in the left arm  10-point ROS otherwise negative.  ____________________________________________   PHYSICAL EXAM:  VITAL SIGNS: ED Triage Vitals  Enc Vitals Group     BP 12/07/14 0643 155/93 mmHg     Pulse Rate 12/07/14 0643 84     Resp 12/07/14 0643 16     Temp 12/07/14 0643 98.4 F (36.9 C)     Temp Source 12/07/14 0643 Oral     SpO2 12/07/14 0643 95 %     Weight 12/07/14 0643 118 lb (53.524 kg)     Height 12/07/14 0643  (1.676 m)     Head Cir --      Peak Flow --      Pain Score 12/07/14 0644 0     Pain Loc --      Pain Edu? --      Excl. in GC? --     Constitutional: Alert and oriented. Well appearing and in no distress. Eyes: Conjunctivae are normal. PERRL. Normal extraocular movements. ENT   Head: Normocephalic and atraumatic.   Nose: No congestion/rhinnorhea.   Mouth/Throat: Mucous membranes are moist.   Neck: No stridor. Hematological/Lymphatic/Immunilogical: No cervical lymphadenopathy. Cardiovascular: Normal rate, regular rhythm. Normal and symmetric distal pulses are present in all extremities. No  murmurs, rubs, or gallops. Respiratory: Normal respiratory effort without tachypnea nor retractions. Breath sounds are clear and equal bilaterally. No wheezes/rales/rhonchi. Gastrointestinal: Soft  and nontender. No distention. No abdominal bruits. There is no CVA tenderness. Musculoskeletal: Nontender with normal range of motion in all extremities. No joint effusions.  No lower extremity tenderness nor edema. Neurologic:  Normal speech and language. No gross focal neurologic deficits are appreciated. Speech is normal. No gait instability. Skin:  Skin is warm, dry and intact. No rash noted. Psychiatric: Mood and affect are normal. Speech and behavior are normal. Patient exhibits appropriate insight and judgment.  ____________________________________________    LABS (pertinent positives/negatives)  Labs Reviewed  CBC - Abnormal; Notable for the following:    MCHC 31.8 (*)    All other components within normal limits  BASIC METABOLIC PANEL - Abnormal; Notable for the following:    Glucose, Bld 124 (*)    Creatinine, Ser 1.02 (*)    GFR calc non Af Amer 49 (*)    GFR calc Af Amer 57 (*)    All other components within normal limits  TROPONIN I    ____________________________________________  ED COURSE:  Pertinent labs & imaging results that were available during my care of the patient were reviewed by me and considered in my medical decision making (see chart for details).  I doubt that this is a TIA or CVA. Patient looks well this appears to be a peripheral nerve problem, likely from positioning in bed. ____________________________________________ EKG: EKG with age for ablation, PVCs, normal axis, no evidence of acute infarction. Rate is 40  RADIOLOGY FINDINGS: The brain shows mild age related volume loss. There is no evidence of old or acute focal infarction, mass lesion, hemorrhage, hydrocephalus or extra-axial collection. No calvarial abnormality. Sinuses are clear. There is  atherosclerotic calcification of the major vessels at the base of the brain.  IMPRESSION: No acute or focal finding. Mild age related volume loss.__________________________    FINAL ASSESSMENT AND PLAN  Paresthesia  Plan: Symptoms likely due to peripheral nerve issue either in bed or with sleeping. Symptoms lasted for a few seconds and have completely resolved. She currently takes Xarelto. We'll have her continue taking her medications.    Emily Filbert, MD   Emily Filbert, MD 12/07/14 [id number]  Emily Filbert, MD 12/07/14 [id number]  Emily Filbert, MD 12/07/14 0830

## 2014-12-07 NOTE — ED Notes (Signed)
Patient returned from CT. Cardiac monitor in place.  

## 2014-12-07 NOTE — ED Notes (Signed)
Patient transported to CT 

## 2014-12-07 NOTE — Discharge Instructions (Signed)

## 2014-12-07 NOTE — Progress Notes (Signed)
Subjective:  Patient ID: Tiffany Grimes, female    DOB: Jan 10, 1929  Age: 81 y.o. MRN: [id number]  CC: The primary encounter diagnosis was Essential hypertension. Diagnoses of Other specified transient cerebral ischemias and Osteoporosis, post-menopausal were also pertinent to this visit.  HPI [name] presents  To discuss bone density test results.  However patient was seen in ED earlier today for transient  LUE numbness and weakness that began at 6 am this morning after waking up at 4 am with normal sensation .  BP was 179/111 at home,  [facility] to ER because she was worried about having an MI.  She had a CT of the head, noncondtrasted,  An EKG and labs, all of which were negative for acute changes.    Outpatient Prescriptions Prior to Visit  Medication Sig Dispense Refill  . amLODipine (NORVASC) 5 MG tablet Take 1 tablet (5 mg total) by mouth daily. 30 tablet 5  . atenolol (TENORMIN) 25 MG tablet TAKE 1 TABLET TWICE A DAY 180 tablet 1  . atorvastatin (LIPITOR) 20 MG tablet TAKE 1 TABLET DAILY 90 tablet 1  . brimonidine (ALPHAGAN P) 0.1 % SOLN Place 1 drop into both eyes 2 (two) times daily.      . cloNIDine (CATAPRES) 0.1 MG tablet Take 1 tablet (0.1 mg total) by mouth 2 (two) times daily. As needed for BP > 150 60 tablet 0  . escitalopram (LEXAPRO) 10 MG tablet TAKE 1 TABLET (10 MG TOTAL) BY MOUTH DAILY. 30 tablet 2  . esomeprazole (NEXIUM) 40 MG capsule TAKE 1 CAPSULE DAILY BEFORE BREAKFAST 90 capsule 2  . fluticasone (FLONASE) 50 MCG/ACT nasal spray Place 2 sprays into both nostrils daily as needed. 2 sprays into each nostril daily 16 g 0  . latanoprost (XALATAN) 0.005 % ophthalmic solution Place 1 drop into both eyes 2 (two) times daily.      Marland Kitchen levothyroxine (SYNTHROID, LEVOTHROID) 50 MCG tablet TAKE 1 TABLET DAILY 90 tablet 1  . polyethylene glycol powder (GLYCOLAX/MIRALAX) powder TAKE 17 GRAMS BY MOUTH DAILY. 1581 g 1  . Rivaroxaban (XARELTO) 15 MG TABS tablet Take 1 tablet (15 mg  total) by mouth daily. 90 tablet 3  . sucralfate (CARAFATE) 1 G tablet TAKE 1 TABLET TWICE A DAY 180 tablet 0  . Tdap (BOOSTRIX) 5-2.5-18.5 LF-MCG/0.5 injection Inject 0.5 mLs into the muscle once. (Patient not taking: Reported on 12/07/2014) 0.5 mL 0   No facility-administered medications prior to visit.     Review of Systems  Patient denies headache, fevers, malaise, unintentional weight loss, skin rash, eye pain, sinus congestion and sinus pain, sore throat, dysphagia,  hemoptysis , cough, dyspnea, wheezing, chest pain, palpitations, orthopnea, edema, abdominal pain, nausea, melena, diarrhea, constipation, flank pain, dysuria, hematuria, urinary  Frequency, nocturia, numbness, tingling, seizures,  Focal weakness, Loss of consciousness,  Tremor, insomnia, depression, anxiety, and suicidal ideation.     Objective:  BP 104/64 mmHg  Pulse 76  Temp(Src) 97.3 F (36.3 C) (Oral)  Resp 14  Ht  (1.651 m)  Wt 122 lb 8 oz (55.566 kg)  BMI 20.39 kg/m2  SpO2 97%  BP Readings from Last 3 Encounters:  12/07/14 104/64  12/07/14 106/61  11/08/14 122/60    Wt Readings from Last 3 Encounters:  12/07/14 122 lb 8 oz (55.566 kg)  12/07/14 118 lb (53.524 kg)  11/08/14 120 lb 12 oz (54.772 kg)    Physical Exam   General appearance: alert, cooperative and appears stated age  Ears: normal TM's and external ear canals both ears Throat: lips, mucosa, and tongue normal; teeth and gums normal Neck: no adenopathy, no carotid bruit, supple, symmetrical, trachea midline and thyroid not enlarged, symmetric, no tenderness/mass/nodules Back: symmetric, no curvature. ROM normal. No CVA tenderness. Lungs: clear to auscultation bilaterally Heart: regular rate and rhythm, S1, S2 normal, no murmur, click, rub or gallop Abdomen: soft, non-tender; bowel sounds normal; no masses,  no organomegaly Pulses: 2+ and symmetric Skin: Skin color, texture, turgor normal. No rashes or lesions Lymph nodes: Cervical,  supraclavicular, and axillary nodes normal.   No results found for: HGBA1C  Lab Results  Component Value Date   CREATININE 1.02* 12/07/2014   CREATININE 1.04 11/08/2014   CREATININE 0.88 10/25/2014    Lab Results  Component Value Date   WBC 5.0 12/07/2014   HGB 13.2 12/07/2014   HCT 41.5 12/07/2014   PLT 207 12/07/2014   GLUCOSE 124* 12/07/2014   CHOL 139 06/06/2014   TRIG 62.0 06/06/2014   HDL 53.70 06/06/2014   LDLCALC 73 06/06/2014   ALT 14 11/08/2014   AST 19 11/08/2014   NA 138 12/07/2014   K 3.9 12/07/2014   CL 102 12/07/2014   CREATININE 1.02* 12/07/2014   BUN 18 12/07/2014   CO2 29 12/07/2014   TSH 3.520 03/30/2014   INR 2.2 07/19/2014   MICROALBUR 0.4 03/30/2014    Ct Head Wo Contrast  12/07/2014   CLINICAL DATA:  Transient left upper extremity weakness and numbness  EXAM: CT HEAD WITHOUT CONTRAST  TECHNIQUE: Contiguous axial images were obtained from the base of the skull through the vertex without intravenous contrast.  COMPARISON:  06/03/2013  FINDINGS: The brain shows mild age related volume loss. There is no evidence of old or acute focal infarction, mass lesion, hemorrhage, hydrocephalus or extra-axial collection. No calvarial abnormality. Sinuses are clear. There is atherosclerotic calcification of the major vessels at the base of the brain.  IMPRESSION: No acute or focal finding.  Mild age related volume loss.   Electronically Signed   By: Paulina Fusi M.D.   On: 12/07/2014 07:32   Dg Chest Port 1 View  12/07/2014   CLINICAL DATA:  81 year old female with a history of left arm numbness.  EXAM: PORTABLE CHEST - 1 VIEW  COMPARISON:  Chest x-ray 07/19/2014, 12/18/2013. Prior CT chest 08/29/2014 and 08/28/2013  FINDINGS: Cardiomediastinal silhouette is unchanged. Tortuosity descending thoracic aorta.  New airspace opacity in the right infrahilar region adjacent to the right heart border. No pneumothorax or pleural effusion. Coarsened interstitial markings at the  bilateral lung bases unchanged.  No displaced fracture.  Degenerative changes of the shoulders.  IMPRESSION: Interval development of airspace opacity in the infrahilar region compared to CT, potentially atelectasis and/ or consolidation. Correlation with presentation may be useful. Chronic lung changes.  Signed,  Yvone Neu. Loreta Ave, DO  [facility]  [facility]   Electronically Signed   By: Gilmer Mor D.O.   On: 12/07/2014 07:54    Assessment & Plan:   Problem List Items Addressed This Visit    Hypertension - Primary    History of labile pressures  aggravated by marital discord and agitation.   Continue current regiment with prn dosing of 5 mg amlodipine and clonidine       Relevant Medications   lisinopril (PRINIVIL,ZESTRIL) 10 MG tablet   TIA (transient ischemic attack)    Patient is already taking Xarelto and statin, today's episode may have been secondary  to arm 'falling asleep' since it occurred while she was in bed,  No changes today,  ER records reviewed with pateint today.       Relevant Medications   lisinopril (PRINIVIL,ZESTRIL) 10 MG tablet   Osteoporosis, post-menopausal    Bone Density scores received, she has bone loss which is significant and was discussed with her today.   I recommended  treating with Prolia given her age, but she is currently not interested in medication.  Advised to increase calcium intake to  1800 mg daily through diet and supplements ,  2000 units of vitamin d and weight bearing exercise on a regular basis        A total of 40 minutes was spent with patient more than half of which was spent in counseling patient on the above mentioned issues , reviewing and explaining recent labs and imaging studies done, and coordination of care.  I am having Ms. Mccary maintain her brimonidine, latanoprost, cloNIDine, polyethylene glycol powder, esomeprazole, amLODipine, Tdap, fluticasone, escitalopram, levothyroxine,  atorvastatin, Rivaroxaban, atenolol, sucralfate, and lisinopril.  Meds ordered this encounter  Medications  . lisinopril (PRINIVIL,ZESTRIL) 10 MG tablet    Sig: Take 1 tablet by mouth 2 (two) times daily.     There are no discontinued medications.  Follow-up: Return in about 6 months (around 06/09/2015).   Sherlene Shams, MD

## 2014-12-07 NOTE — Progress Notes (Signed)
Pre-visit discussion using our clinic review tool. No additional management support is needed unless otherwise documented below in the visit note.  

## 2014-12-07 NOTE — Patient Instructions (Addendum)
You have osteoporosis, which means your risk of fracturing a hip or spine bone is increased (1in 5 over the next 20 years)  YOU have decided NOT TO TAKE ANY MEDICATION FOR THIS.   You need 1800 mg of calcium daily  And 1000 units of vit D3 daily to support your bones,  And regular daily weight bearing exercise   I recommend getting the majority of your calcium and Vitamin D  through DIETARY SOURCES  rather than supplements,  given the recent association of calcium supplements with increased coronary artery calcium scores (and possibly heart attacks)  You can the almond or cashew milk as an alternative to milk.  Try the Silk Almond Light,  Original formula.  Delicious,  Low carb,  Low cal,  Cholesterol free,  Has 430 mg calcium per 8 ounce glass If you change your mind about treatment,  Please consider the twice annually Prolia injection   Osteoporosis Throughout your life, your body breaks down old bone and replaces it with new bone. As you get older, your body does not replace bone as quickly as it breaks it down. By the age of 30 years, most people begin to gradually lose bone because of the imbalance between bone loss and replacement. Some people lose more bone than others. Bone loss beyond a specified normal degree is considered osteoporosis.  Osteoporosis affects the strength and durability of your bones. The inside of the ends of your bones and your flat bones, like the bones of your pelvis, look like honeycomb, filled with tiny open spaces. As bone loss occurs, your bones become less dense. This means that the open spaces inside your bones become bigger and the walls between these spaces become thinner. This makes your bones weaker. Bones of a person with osteoporosis can become so weak that they can break (fracture) during minor accidents, such as a simple fall. CAUSES  The following factors have been associated with the development of osteoporosis:  Smoking.  Drinking more than 2  alcoholic drinks several days per week.  Long-term use of certain medicines:  Corticosteroids.  Chemotherapy medicines.  Thyroid medicines.  Antiepileptic medicines.  Gonadal hormone suppression medicine.  Immunosuppression medicine.  Being underweight.  Lack of physical activity.  Lack of exposure to the sun. This can lead to vitamin D deficiency.  Certain medical conditions:  Certain inflammatory bowel diseases, such as Crohn disease and ulcerative colitis.  Diabetes.  Hyperthyroidism.  Hyperparathyroidism. RISK FACTORS Anyone can develop osteoporosis. However, the following factors can increase your risk of developing osteoporosis:  Gender--Women are at higher risk than men.  Age--Being older than 50 years increases your risk.  Ethnicity--White and Asian people have an increased risk.  Weight --Being extremely underweight can increase your risk of osteoporosis.  Family history of osteoporosis--Having a family member who has developed osteoporosis can increase your risk. SYMPTOMS  Usually, people with osteoporosis have no symptoms.  DIAGNOSIS  Signs during a physical exam that may prompt your caregiver to suspect osteoporosis include:  Decreased height. This is usually caused by the compression of the bones that form your spine (vertebrae) because they have weakened and become fractured.  A curving or rounding of the upper back (kyphosis). To confirm signs of osteoporosis, your caregiver may request a procedure that uses 2 low-dose X-ray beams with different levels of energy to measure your bone mineral density (dual-energy X-ray absorptiometry [DXA]). Also, your caregiver may check your level of vitamin D. TREATMENT  The goal of osteoporosis treatment  is to strengthen bones in order to decrease the risk of bone fractures. There are different types of medicines available to help achieve this goal. Some of these medicines work by slowing the processes of bone  loss. Some medicines work by increasing bone density. Treatment also involves making sure that your levels of calcium and vitamin D are adequate. PREVENTION  There are things you can do to help prevent osteoporosis. Adequate intake of calcium and vitamin D can help you achieve optimal bone mineral density. Regular exercise can also help, especially resistance and weight-bearing activities. If you smoke, quitting smoking is an important part of osteoporosis prevention. MAKE SURE YOU:  Understand these instructions.  Will watch your condition.  Will get help right away if you are not doing well or get worse. FOR MORE INFORMATION www.osteo.org and [employer] Document Released: 04/17/2005 Document Revised: 11/02/2012 Document Reviewed: 06/22/2011 [facility] Patient Information 2015 [place], [place]. This information is not intended to replace advice given to you by your health care provider. Make sure you discuss any questions you have with your health care provider.

## 2014-12-07 NOTE — ED Notes (Addendum)
Patient presents with c/o LUE numbness that was first appreciated when patient woke up at 0500. Denies CP. (+) palpitations. Patient took Clonidine at 0600.

## 2014-12-09 ENCOUNTER — Other Ambulatory Visit: Payer: Self-pay

## 2014-12-09 NOTE — Telephone Encounter (Signed)
Closing this encounter since it is from 2013 I am sure this has been handled. °

## 2014-12-10 ENCOUNTER — Encounter: Payer: Self-pay | Admitting: Internal Medicine

## 2014-12-10 NOTE — Assessment & Plan Note (Signed)
History of labile pressures  aggravated by marital discord and agitation.   Continue current regiment with prn dosing of 5 mg amlodipine and clonidine

## 2014-12-10 NOTE — Assessment & Plan Note (Signed)
Bone Density scores received, she has bone loss which is significant and was discussed with her today.   I recommended  treating with Prolia given her age, but she is currently not interested in medication.  Advised to increase calcium intake to  1800 mg daily through diet and supplements ,  2000 units of vitamin d and weight bearing exercise on a regular basis

## 2014-12-10 NOTE — Assessment & Plan Note (Signed)
Patient is already taking Xarelto and statin, today's episode may have been secondary to arm 'falling asleep' since it occurred while she was in bed,  No changes today,  ER records reviewed with pateint today.

## 2014-12-13 ENCOUNTER — Other Ambulatory Visit: Payer: Self-pay | Admitting: Internal Medicine

## 2014-12-13 MED ORDER — AMLODIPINE BESYLATE 5 MG PO TABS: 5.0000 mg | ORAL_TABLET | Freq: Every day | ORAL | Status: DC

## 2014-12-13 NOTE — Telephone Encounter (Signed)
Script E-scribed to pharmacy for refill on Amlodipine. Patient notified.

## 2015-02-14 ENCOUNTER — Telehealth: Payer: Self-pay | Admitting: *Deleted

## 2015-02-14 NOTE — Telephone Encounter (Signed)
Spoke with Dr Darrick Huntsman, she advised pt should stop taking Xorelto 5 days prior to Dental extraction.  Pt verbalized understanding.  States she will postpone procedure.

## 2015-02-14 NOTE — Telephone Encounter (Signed)
Pt called requesting whether she is to stop her Mariel Sleet today due to a tooth extraction on tomorrow.  Please advise

## 2015-03-14 ENCOUNTER — Other Ambulatory Visit: Payer: Self-pay | Admitting: Internal Medicine

## 2015-04-03 ENCOUNTER — Other Ambulatory Visit: Payer: Self-pay | Admitting: Internal Medicine

## 2015-04-16 ENCOUNTER — Other Ambulatory Visit: Payer: Self-pay | Admitting: Internal Medicine

## 2015-04-25 ENCOUNTER — Other Ambulatory Visit: Payer: Self-pay

## 2015-04-25 MED ORDER — ATENOLOL 25 MG PO TABS: 25.0000 mg | ORAL_TABLET | Freq: Two times a day (BID) | ORAL | Status: DC

## 2015-04-25 NOTE — Telephone Encounter (Signed)
Spoke with the patient to inform her that I have refill the Atenolol.  Patient verbalized understanding.

## 2015-06-13 ENCOUNTER — Other Ambulatory Visit: Payer: Self-pay | Admitting: Internal Medicine

## 2015-07-14 ENCOUNTER — Other Ambulatory Visit: Payer: Self-pay | Admitting: Internal Medicine

## 2015-07-25 ENCOUNTER — Telehealth: Payer: Self-pay | Admitting: Internal Medicine

## 2015-07-25 NOTE — Telephone Encounter (Signed)
Pt left a message regarding needing a prescription for congestion she had for 1 week. Pharmacy is [id number] [address], [place] [phone] I feel like pt will need a appt. Let me know. Thank You!

## 2015-07-25 NOTE — Telephone Encounter (Signed)
Ok. Pt is scheduled to come in on 07/28/2015 to see Dr Darrick Huntsman.

## 2015-07-25 NOTE — Telephone Encounter (Signed)
Yes please schedule next appointment available, may need to use other provider.

## 2015-07-28 ENCOUNTER — Ambulatory Visit (INDEPENDENT_AMBULATORY_CARE_PROVIDER_SITE_OTHER): Payer: Medicare Other | Admitting: Internal Medicine

## 2015-07-28 VITALS — BP 104/78 | HR 66 | Temp 98.0°F | Resp 12 | Ht 65.0 in | Wt 116.8 lb

## 2015-07-28 DIAGNOSIS — F0390 Unspecified dementia without behavioral disturbance: Secondary | ICD-10-CM

## 2015-07-28 DIAGNOSIS — F0392 Unspecified dementia, unspecified severity, with psychotic disturbance: Secondary | ICD-10-CM

## 2015-07-28 DIAGNOSIS — Z79899 Other long term (current) drug therapy: Secondary | ICD-10-CM

## 2015-07-28 DIAGNOSIS — E038 Other specified hypothyroidism: Secondary | ICD-10-CM

## 2015-07-28 DIAGNOSIS — E034 Atrophy of thyroid (acquired): Secondary | ICD-10-CM | POA: Diagnosis not present

## 2015-07-28 DIAGNOSIS — R636 Underweight: Secondary | ICD-10-CM

## 2015-07-28 DIAGNOSIS — E785 Hyperlipidemia, unspecified: Secondary | ICD-10-CM

## 2015-07-28 DIAGNOSIS — E559 Vitamin D deficiency, unspecified: Secondary | ICD-10-CM | POA: Diagnosis not present

## 2015-07-28 DIAGNOSIS — K145 Plicated tongue: Secondary | ICD-10-CM

## 2015-07-28 DIAGNOSIS — I10 Essential (primary) hypertension: Secondary | ICD-10-CM

## 2015-07-28 LAB — COMPREHENSIVE METABOLIC PANEL
ALK PHOS: 86 U/L (ref 39–117)
ALT: 15 U/L (ref 0–35)
AST: 24 U/L (ref 0–37)
Albumin: 4 g/dL (ref 3.5–5.2)
BILIRUBIN TOTAL: 0.7 mg/dL (ref 0.2–1.2)
BUN: 16 mg/dL (ref 6–23)
CO2: 32 mEq/L (ref 19–32)
Calcium: 9.4 mg/dL (ref 8.4–10.5)
Chloride: 101 mEq/L (ref 96–112)
Creatinine, Ser: 0.86 mg/dL (ref 0.40–1.20)
GFR: 66.7 mL/min (ref >60.00)
GLUCOSE: 94 mg/dL (ref 70–99)
Potassium: 4.5 mEq/L (ref 3.5–5.1)
Sodium: 138 mEq/L (ref 135–145)
Total Protein: 6.3 g/dL (ref 6.0–8.3)

## 2015-07-28 LAB — LIPID PANEL
Cholesterol: 143 mg/dL (ref 0–200)
HDL: 51.3 mg/dL (ref >39.00)
LDL Cholesterol: 81 mg/dL (ref 0–99)
NonHDL: 91.22
Total CHOL/HDL Ratio: 3
Triglycerides: 52 mg/dL (ref 0.0–149.0)
VLDL: 10.4 mg/dL (ref 0.0–40.0)

## 2015-07-28 LAB — VITAMIN D 25 HYDROXY (VIT D DEFICIENCY, FRACTURES): VITD: 49.78 ng/mL (ref 30.00–100.00)

## 2015-07-28 LAB — TSH: TSH: 3.12 u[IU]/mL (ref 0.35–4.50)

## 2015-07-28 MED ORDER — LISINOPRIL 10 MG PO TABS: 10.0000 mg | ORAL_TABLET | Freq: Two times a day (BID) | ORAL | Status: DC

## 2015-07-28 MED ORDER — TETANUS-DIPHTH-ACELL PERTUSSIS 5-2.5-18.5 LF-MCG/0.5 IM SUSP: 0.5000 mL | Freq: Once | INTRAMUSCULAR | Status: DC

## 2015-07-28 NOTE — Patient Instructions (Addendum)
Your blood pressure is normal here, so continue to use your clonidine as needed for SBP > 160     You have a furrowed tongue .  This is not serious .  Just continue to brush tongue and use mouthwash to prevetn bad breath  Your still need your tetanus-diptheria-pertussis vaccine (TDaP) but you can get it for less $$$ at a local pharmacy with the script I have provided you. The TDap is $110 out of pocket since medicare will not pay for it,    Most pharmacies will offer it as well for far less  [employer] supposed;y offers it for $30 to 60 )

## 2015-07-28 NOTE — Progress Notes (Signed)
Pre-visit discussion using our clinic review tool. No additional management support is needed unless otherwise documented below in the visit note.  
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
## 2015-07-30 DIAGNOSIS — K145 Plicated tongue: Secondary | ICD-10-CM | POA: Insufficient documentation

## 2015-07-30 DIAGNOSIS — R636 Underweight: Secondary | ICD-10-CM | POA: Insufficient documentation

## 2015-07-30 NOTE — Assessment & Plan Note (Signed)
She has continually declined to bring her home BP meter to office to validate.  No changes to medications today given normal pressures repeatedly here in the office. Suggested that she take her meter to her pharmacy for comparison.
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
## 2015-07-30 NOTE — Assessment & Plan Note (Signed)
Tongue is furrowed,  Not hairy.  Reassurance provided.

## 2015-07-30 NOTE — Assessment & Plan Note (Signed)
I have reviewed her diet and recommended that she increase her protein and fat intake while monitoring her carbohydrates.  

## 2015-07-30 NOTE — Assessment & Plan Note (Signed)
LDL and triglycerides are at goal on current medications. She has no side effects and liver enzymes are normal. No changes today.  Lab Results  Component Value Date   CHOL 143 07/28/2015   HDL 51.30 07/28/2015   LDLCALC 81 07/28/2015   TRIG 52.0 07/28/2015   CHOLHDL 3 07/28/2015   Lab Results  Component Value Date   ALT 15 07/28/2015   AST 24 07/28/2015   ALKPHOS 86 07/28/2015   BILITOT 0.7 07/28/2015

## 2015-07-31 ENCOUNTER — Encounter: Payer: Self-pay | Admitting: *Deleted

## 2015-08-17 ENCOUNTER — Other Ambulatory Visit: Payer: Self-pay | Admitting: Internal Medicine

## 2015-08-28 ENCOUNTER — Telehealth: Payer: Self-pay

## 2015-08-28 MED ORDER — GLUCOSE BLOOD VI STRP: ORAL_STRIP | Status: DC

## 2015-08-28 MED ORDER — SUCRALFATE 1 G PO TABS: 1.0000 g | ORAL_TABLET | Freq: Two times a day (BID) | ORAL | Status: DC

## 2015-08-28 NOTE — Telephone Encounter (Signed)
Pt states that she has been testing her blood with glucometer machine [employer], but the company just recently stopped making test strips for her old meter- [employer]reeze 2 diskus. She is requesting test strips for her new meter, pt recognizes that she is not a diabetic but likes to check her blood sugar. She would like to get a script for new test strips for her [employer]Track machine. Please advise

## 2015-08-28 NOTE — Telephone Encounter (Signed)
[facility].

## 2015-09-30 ENCOUNTER — Other Ambulatory Visit: Payer: Self-pay | Admitting: Internal Medicine

## 2015-10-13 ENCOUNTER — Ambulatory Visit (INDEPENDENT_AMBULATORY_CARE_PROVIDER_SITE_OTHER): Payer: Medicare Other | Admitting: Internal Medicine

## 2015-10-13 ENCOUNTER — Encounter: Payer: Self-pay | Admitting: Internal Medicine

## 2015-10-13 VITALS — BP 124/70 | HR 78 | Ht 65.0 in | Wt 120.4 lb

## 2015-10-13 DIAGNOSIS — J432 Centrilobular emphysema: Secondary | ICD-10-CM | POA: Diagnosis not present

## 2015-10-13 NOTE — Assessment & Plan Note (Signed)
Centrilobular emphysema noted with patchy fibrotic changes in the bases, left greater than right and lobular septal thickening in the lower lobes. Most likely related to being alpha 1 antitrypsin deficiency carrier. Currently asymptomatic-no shortness of breath, no fever, no cough, no weight loss. Close observation and supportive care is warranted at this time. Follow-up is needed

## 2015-10-13 NOTE — Progress Notes (Signed)
MRN# [id number] Tiffany Grimes Mar 14, 1929   CC:"followup on the lung nodules" Chief Complaint  Patient presents with  . Follow-up    pt. c/o chest congestion feels its allergy related. denies SOB, cough, wheezing or chest pain/tightness.      Brief History: Synopsis: 82 year female seen at [facility] pulmonary [place] September 2015 for evaluation of pulmonary nodules. Patient noted to have pulmonary nodules on CT right lower lobe 8 mm left lower lobe 6 mm. Noted to be alpha 1 antitrypsin carrier gene. Repeat CT February 2016 showed nodules no longer exists, most likely previous findings or atelectasis.   Events since last clinic visit: Patient is a pleasant 82 yo female presenting today for followup visit of emphysema. Currently she denies any fever, night sweats, chills, weight loss, shortness of breath, dyspnea on exertion, sputum production, cough, rigors, Or hemoptysis. Patient currently has a diagnosis of centrilobular emphysema based on imaging, currently asymptomatic not requiring any inhalers. She states that today she is doing well, she has some mild seasonal allergies, otherwiseno skin shortness of breath, nausea, vomiting, diarrhea, chills.   PMHX:   Past Medical History  Diagnosis Date  . Thyroid disease   . TIA (transient ischemic attack) June 2012  . TIA (transient ischemic attack) June 2012  . Barrett esophagus     due for EGD July 2012, [place]  . Reflux   . Hx of thyroid nodule July 2012    benign biopsy   . Lentigo   . Hypertension    Surgical Hx:  Past Surgical History  Procedure Laterality Date  . Thyroid surgery  june 18    nodule removed  . Hernia repair  2008    left inguinal   Family Hx:  Family History  Problem Relation Age of Onset  . Hypertension Mother   . Stroke Mother   . Heart disease Father 51    AMI  . Hypertension Father   . Vision loss Paternal Aunt   . Cancer Neg Hx    Social Hx:   Social History  Substance Use Topics  .  Smoking status: Never Smoker   . Smokeless tobacco: Never Used  . Alcohol Use: No   Medication:   Current Outpatient Rx  Name  Route  Sig  Dispense  Refill  . amLODipine (NORVASC) 5 MG tablet      TAKE 1 TABLET BY MOUTH DAILY.   30 tablet   5   . atenolol (TENORMIN) 25 MG tablet   Oral   Take 1 tablet (25 mg total) by mouth 2 (two) times daily.   180 tablet   1   . atorvastatin (LIPITOR) 20 MG tablet      TAKE 1 TABLET DAILY   90 tablet   2   . brimonidine (ALPHAGAN P) 0.1 % SOLN   Both Eyes   Place 1 drop into both eyes 2 (two) times daily.           . cloNIDine (CATAPRES) 0.1 MG tablet   Oral   Take 1 tablet (0.1 mg total) by mouth 2 (two) times daily. As needed for BP > 150   60 tablet   0   . escitalopram (LEXAPRO) 10 MG tablet      TAKE 1 TABLET BY MOUTH DAILY.   30 tablet   2   . esomeprazole (NEXIUM) 40 MG capsule      TAKE 1 CAPSULE DAILY BEFORE BREAKFAST   90 capsule   1   .  fluticasone (FLONASE) 50 MCG/ACT nasal spray   Each Nare   Place 2 sprays into both nostrils daily as needed. 2 sprays into each nostril daily   16 g   0   . glucose blood test strip      Use as instructed   50 each   6     As not more than once daily as needed for hypergly ...   . latanoprost (XALATAN) 0.005 % ophthalmic solution   Both Eyes   Place 1 drop into both eyes 2 (two) times daily.           Marland Kitchen levothyroxine (SYNTHROID, LEVOTHROID) 50 MCG tablet      TAKE 1 TABLET DAILY   90 tablet   1   . lisinopril (PRINIVIL,ZESTRIL) 10 MG tablet   Oral   Take 1 tablet (10 mg total) by mouth 2 (two) times daily.   180 tablet   2   . Rivaroxaban (XARELTO) 15 MG TABS tablet   Oral   Take 1 tablet (15 mg total) by mouth daily.   90 tablet   3   . sucralfate (CARAFATE) 1 g tablet   Oral   Take 1 tablet (1 g total) by mouth 2 (two) times daily.   180 tablet   3   . Tdap (BOOSTRIX) 5-2.5-18.5 LF-MCG/0.5 injection   Intramuscular   Inject 0.5 mLs into the  muscle once.   0.5 mL   0   . Tdap (BOOSTRIX) 5-2.5-18.5 LF-MCG/0.5 injection   Intramuscular   Inject 0.5 mLs into the muscle once.   0.5 mL   0      Review of Systems: Gen:  Denies  fever, sweats, chills HEENT: Denies blurred vision, double vision, ear pain, eye pain, hearing loss, nose bleeds, sore throat Cvc:  No dizziness, chest pain or heaviness Resp:   Denies cough or sputum porduction, shortness of breath Gi: Denies swallowing difficulty, stomach pain, nausea or vomiting, diarrhea, constipation, bowel incontinence Gu:  Denies bladder incontinence, burning urine Ext:   No Joint pain, stiffness or swelling Skin: No skin rash, easy bruising or bleeding or hives Endoc:  No polyuria, polydipsia , polyphagia or weight change Psych: No depression, insomnia or hallucinations  Other:  All other systems negative  Allergies:  Neomycin  Physical Examination:  VS: BP 124/70 mmHg  Pulse 78  Ht 5\' 5"  (1.651 m)  Wt 120 lb 6.4 oz (54.613 kg)  BMI 20.04 kg/m2  SpO2 98%  General Appearance: No distress  HEENT: PERRLA, EOM intact, no ptosis, no other lesions noticed Pulmonary:Exam: normal breath sounds., diaphragmatic excursion normal.No wheezing, No rales   Cardiovascular:@ Exam:  Normal S1,S2.  No m/r/g.     Abdomen:Exam: Benign, Soft, non-tender, No masses  Skin:   warm, no rashes, no ecchymosis  Extremities: normal, no cyanosis, clubbing, no edema, warm with normal capillary refill.   Labs results:  BMP Lab Results  Component Value Date   NA 138 07/28/2015   K 4.5 07/28/2015   CL 101 07/28/2015   CO2 32 07/28/2015   GLUCOSE 94 07/28/2015   BUN 16 07/28/2015   CREATININE 0.86 07/28/2015     CBC CBC Latest Ref Rng 12/07/2014 10/25/2014 07/19/2014  WBC 3.6 - 11.0 K/uL 5.0 8.0 5.3  Hemoglobin 12.0 - 16.0 g/dL 96.0 45.4 09.8  Hematocrit 35.0 - 47.0 % 41.5 38.2 41.5  Platelets 150 - 440 K/uL 207 179 192     Rad results: (The following images and results  were reviewed by  Dr. Dema Severin). CT Chest 08/29/14 FINDINGS: No axillary, hilar or mediastinal lymphadenopathy is identified. There is cardiomegaly. Calcific aortic and coronary atherosclerosis is identified. No pleural or pericardial effusion.  Two nodular opacities in the right lower lobe seen on the prior examination are normal are no longer present. These were likely due to atelectasis. No nodule, mass or consolidative process is seen.  Densely imaged upper abdomen demonstrates no focal abnormality. No lytic or sclerotic bony lesion is seen.   IMPRESSION: Previously described right lower lobe nodules are no longer appreciated and were likely due to atelectasis.  Cardiomegaly.  Calcific coronary artery disease.  CT Chest 08/28/13 FINDINGS: There is no demonstrable thoracic aortic aneurysm or dissection. Great vessels appear patent and normal. Note that there is a common origin of the left and right common carotid arteries, an anatomic variant. There is no apparent thoracic aortic ulceration. There is mild atherosclerotic change in the aorta. There are multiple foci of coronary artery calcification. Heart is borderline enlarged venous not thickened. There is no demonstrable pulmonary embolus.  There is underlying centrilobular emphysema. There is mild lower lobe bronchiectatic change. There are multiple areas of lobular septal thickening in the lower lobes. There is patchy fibrotic change in the lung bases, more on the left than on the right. There is no well-defined airspace consolidation.  On axial slice 35, series 3, there is a 6 mm nodular opacity in the superior segment of the left lower lobe. On axial slice 30, there is a focal 8 mm nodular opacity in the superior segment right lower lobe. On this same slice, there is a 6 mm nodular opacity in the superior segment right lower lobe.  There is no appreciable thoracic adenopathy.  Visualized upper abdominal structures appear unremarkable.  There are no appreciable blastic or lytic bone lesions. There are several small nodular opacities in the thyroid. Right lobe of the thyroid is virtually aplastic.   IMPRESSION: No thoracic aortic aneurysm or dissection. No demonstrable pulmonary embolus.  There is atherosclerotic change. Note that there are multiple coronary artery calcifications.  There is underlying emphysema with areas of fibrotic change in the lower lobes, more on the left than on the right. No airspace consolidation.  There are several nodular opacities in the right lower lobe, largest measuring 8 mm. Followup of these nodular opacity should be based on Fleischner Society guidelines. If the patient is at high risk for bronchogenic carcinoma, follow-up chest CT at 3-37months is recommended. If the patient is at low risk for bronchogenic carcinoma, follow-up chest CT at 6-12 months is recommended. This recommendation follows the consensus statement: Guidelines for Management of Small Pulmonary Nodules Detected on CT Scans: A Statement from the Fleischner Society as published in Radiology 2005; 237:395-400.  Nodular opacities in thyroid. Nonemergent thyroid ultrasound to further assess may be advisable   Assessment and Plan:82 yo female past medical history of centrilobular emphysema, pulmonary nodules, hypertension, following up for emphysema. Centrilobular emphysema Centrilobular emphysema noted with patchy fibrotic changes in the bases, left greater than right and lobular septal thickening in the lower lobes. Most likely related to being alpha 1 antitrypsin deficiency carrier. Currently asymptomatic-no shortness of breath, no fever, no cough, no weight loss. Close observation and supportive care is warranted at this time. Follow-up is needed         Updated Medication List Outpatient Encounter Prescriptions as of 10/13/2015  Medication Sig  . amLODipine (NORVASC) 5 MG tablet TAKE 1 TABLET BY MOUTH  DAILY.  . atenolol (TENORMIN) 25 MG tablet Take 1 tablet (25 mg total) by mouth 2 (two) times daily.  Marland Kitchen atorvastatin (LIPITOR) 20 MG tablet TAKE 1 TABLET DAILY  . brimonidine (ALPHAGAN P) 0.1 % SOLN Place 1 drop into both eyes 2 (two) times daily.    . cloNIDine (CATAPRES) 0.1 MG tablet Take 1 tablet (0.1 mg total) by mouth 2 (two) times daily. As needed for BP > 150  . escitalopram (LEXAPRO) 10 MG tablet TAKE 1 TABLET BY MOUTH DAILY.  Marland Kitchen esomeprazole (NEXIUM) 40 MG capsule TAKE 1 CAPSULE DAILY BEFORE BREAKFAST  . fluticasone (FLONASE) 50 MCG/ACT nasal spray Place 2 sprays into both nostrils daily as needed. 2 sprays into each nostril daily  . glucose blood test strip Use as instructed  . latanoprost (XALATAN) 0.005 % ophthalmic solution Place 1 drop into both eyes 2 (two) times daily.    Marland Kitchen levothyroxine (SYNTHROID, LEVOTHROID) 50 MCG tablet TAKE 1 TABLET DAILY  . lisinopril (PRINIVIL,ZESTRIL) 10 MG tablet Take 1 tablet (10 mg total) by mouth 2 (two) times daily.  . Rivaroxaban (XARELTO) 15 MG TABS tablet Take 1 tablet (15 mg total) by mouth daily.  . sucralfate (CARAFATE) 1 g tablet Take 1 tablet (1 g total) by mouth 2 (two) times daily.  . Tdap (BOOSTRIX) 5-2.5-18.5 LF-MCG/0.5 injection Inject 0.5 mLs into the muscle once.  . Tdap (BOOSTRIX) 5-2.5-18.5 LF-MCG/0.5 injection Inject 0.5 mLs into the muscle once.  . [DISCONTINUED] levothyroxine (SYNTHROID, LEVOTHROID) 50 MCG tablet TAKE 1 TABLET DAILY (Patient not taking: Reported on 10/13/2015)   No facility-administered encounter medications on file as of 10/13/2015.    Orders for this visit: No orders of the defined types were placed in this encounter.    Thank  you for the visitation and for allowing  [facility] to assist in the care of your patient. Our recommendations are noted above.  Please contact us if we can be of further service.  Stephanie Acre, MD [facility] Office Number: [phone]

## 2015-10-13 NOTE — Patient Instructions (Signed)
Follow up with Dr. Dema Severin as needed - cont with diet and exercise - may use OTC allergy meds (example Claritin, Allegra) for congestion/allergies - if allergies symptoms not improving with OTC meds, then call us back, we can give you an rx for singulair.

## 2015-10-19 DIAGNOSIS — Z8659 Personal history of other mental and behavioral disorders: Secondary | ICD-10-CM | POA: Insufficient documentation

## 2015-10-19 DIAGNOSIS — E8801 Alpha-1-antitrypsin deficiency: Secondary | ICD-10-CM | POA: Insufficient documentation

## 2015-11-05 ENCOUNTER — Other Ambulatory Visit: Payer: Self-pay | Admitting: Internal Medicine

## 2015-11-29 ENCOUNTER — Other Ambulatory Visit: Payer: Self-pay | Admitting: Internal Medicine

## 2015-12-21 ENCOUNTER — Other Ambulatory Visit: Payer: Self-pay | Admitting: Internal Medicine

## 2016-03-30 ENCOUNTER — Other Ambulatory Visit: Payer: Self-pay | Admitting: Internal Medicine

## 2016-04-02 ENCOUNTER — Other Ambulatory Visit: Payer: Self-pay | Admitting: Internal Medicine

## 2016-06-01 ENCOUNTER — Other Ambulatory Visit: Payer: Self-pay | Admitting: Internal Medicine

## 2016-06-10 ENCOUNTER — Other Ambulatory Visit: Payer: Self-pay | Admitting: Internal Medicine

## 2016-07-07 ENCOUNTER — Encounter: Payer: Self-pay | Admitting: Emergency Medicine

## 2016-07-07 ENCOUNTER — Emergency Department
Admission: EM | Admit: 2016-07-07 | Discharge: 2016-07-07 | Disposition: A | Discharge status: Home / Self Care | Payer: Medicare Other | Attending: Emergency Medicine | Admitting: Emergency Medicine

## 2016-07-07 DIAGNOSIS — Z79899 Other long term (current) drug therapy: Secondary | ICD-10-CM | POA: Diagnosis not present

## 2016-07-07 DIAGNOSIS — I10 Essential (primary) hypertension: Secondary | ICD-10-CM

## 2016-07-07 DIAGNOSIS — E039 Hypothyroidism, unspecified: Secondary | ICD-10-CM | POA: Diagnosis not present

## 2016-07-07 HISTORY — DX: Unspecified atrial fibrillation: I48.91

## 2016-07-07 MED ORDER — ATENOLOL 25 MG PO TABS
25.0000 mg | ORAL_TABLET | Freq: Once | ORAL | Status: AC
  Administered 2016-07-07: 25 mg via ORAL
  Filled 2016-07-07: qty 1

## 2016-07-07 MED ORDER — AMLODIPINE BESYLATE 5 MG PO TABS
5.0000 mg | ORAL_TABLET | Freq: Once | ORAL | Status: AC
  Administered 2016-07-07: 5 mg via ORAL
  Filled 2016-07-07: qty 1

## 2016-07-07 MED ORDER — LISINOPRIL 10 MG PO TABS
10.0000 mg | ORAL_TABLET | Freq: Once | ORAL | Status: AC
  Administered 2016-07-07: 10 mg via ORAL
  Filled 2016-07-07: qty 1

## 2016-07-07 NOTE — Discharge Instructions (Signed)
Be sure to take your medications especially if your  blood pressure is high. You  do also have the clonidine at home to take as needed if you blood pressure is even higher than normal, but obviously take it only as directed. If you have chest pain shortness of breath headache numbness weakness or any other new or worrisome symptoms or you feel worse in any way please return to the emergency department

## 2016-07-07 NOTE — ED Provider Notes (Addendum)
[facility] Emergency Department Provider Note  ____________________________________________   I have reviewed the triage vital signs and the nursing notes.   HISTORY  Chief Complaint Hypertension    HPI Tiffany Grimes is a 82 y.o. female who presents today with no real complaints. Patient states that she has a history of hypertension. According to her med list, she is on atenolol and amlodipine, she states she takes her blood pressure every morning. It was elevated this morning. She thought that the best thing to do would be to come in here at that time without taking any of her home blood pressure medications so I can see how high her blood pressure was. Patient's med list suggest that she has3 different hypertensive agents which she states she normally takes and she is prescribed Catapres 0.1, blood pressures over 150. Unfortunately she took none of these medications. Patient had no chest pain shortness of breath nausea or vomiting. She stated that there was a small area, about 2 inches, on the back of her head which felt a little tingling this morning but otherwise she had no neurologic complaints. Patient states that might "have been from where he slept" she has no ongoing symptoms of any variety. She has no headache or focal numbness or weakness. Patient states that she is only here because her blood pressure was more elevated than normal this morning. I did ask her why she didn't take her blood pressure medication as is a usual thing to do a month's blood pressures I especially as she has breakthrough medications written, and she states that she wanted to make sure I could see how high it was.     Past Medical History:  Diagnosis Date  . Atrial fibrillation (HCC)   . Barrett esophagus    due for EGD July 2012, [place]  . Hx of thyroid nodule July 2012   benign biopsy   . Hypertension   . Lentigo   . Reflux   . Thyroid disease   . TIA (transient ischemic  attack) June 2012  . TIA (transient ischemic attack) June 2012    Patient Active Problem List   Diagnosis Date Noted  . Underweight 07/30/2015  . Furrowed tongue 07/30/2015  . Centrilobular emphysema (HCC) 04/18/2014  . Alpha-1-antitrypsin deficiency carrier (HCC) 04/18/2014  . Unspecified hypothyroidism 04/07/2014  . Multiple pulmonary nodules 10/05/2013  . Pulmonary nodules/lesions, multiple 08/31/2013  . PAD (peripheral artery disease) (HCC) 08/08/2013  . External hemorrhoid 08/02/2013  . Anal fissure 08/02/2013  . Ecchymosis 05/10/2013  . Anxiety state 10/28/2012  . Routine general medical examination at a health care facility 06/15/2012  . 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 Osteoporosis, post-menopausal 02/11/2012  . Lumbago with sciatica of right side 02/11/2012  . Posterior neck pain 02/11/2012  . Hyperlipidemia LDL goal <100 12/16/2011  . Hx of thyroid nodule   . Atrial fibrillation, new onset (HCC) 08/02/2011  . Chest pain, atypical 08/02/2011  . TIA (transient ischemic attack)   . Barrett esophagus   . Hypertension 03/29/2011    Past Surgical History:  Procedure Laterality Date  . ABDOMINAL HYSTERECTOMY    . CESAREAN SECTION    . HERNIA REPAIR  2008   left inguinal  . THYROID SURGERY  june 18   nodule removed    Prior to Admission medications   Medication Sig Start Date End Date Taking? Authorizing Provider  amLODipine (NORVASC) 5 MG tablet TAKE 1 TABLET BY MOUTH DAILY. 06/11/16   Rosey Bath  Quintella Baton, MD  atenolol (TENORMIN) 25 MG tablet TAKE 1 TABLET TWICE A DAY 12/21/15   Sherlene Shams, MD  atorvastatin (LIPITOR) 20 MG tablet TAKE 1 TABLET DAILY 07/14/15   Sherlene Shams, MD  brimonidine (ALPHAGAN P) 0.1 % SOLN Place 1 drop into both eyes 2 (two) times daily.      Historical Provider, MD  cloNIDine (CATAPRES) 0.1 MG tablet Take 1 tablet (0.1 mg total) by mouth 2 (two) times daily. As needed for BP > 150 03/18/14   Sherlene Shams, MD  escitalopram  (LEXAPRO) 10 MG tablet TAKE 1 TABLET BY MOUTH DAILY. 03/14/15   Sherlene Shams, MD  esomeprazole (NEXIUM) 40 MG capsule TAKE 1 CAPSULE DAILY BEFORE BREAKFAST 04/01/16   Sherlene Shams, MD  fluticasone (FLONASE) 50 MCG/ACT nasal spray Place 2 sprays into both nostrils daily as needed. 2 sprays into each nostril daily 07/28/14   Sherlene Shams, MD  glucose blood test strip Use as instructed 08/28/15   Sherlene Shams, MD  latanoprost (XALATAN) 0.005 % ophthalmic solution Place 1 drop into both eyes 2 (two) times daily.      Historical Provider, MD  levothyroxine (SYNTHROID, LEVOTHROID) 50 MCG tablet TAKE 1 TABLET BY MOUTH DAILY 04/02/16   Sherlene Shams, MD  lisinopril (PRINIVIL,ZESTRIL) 10 MG tablet Take 1 tablet (10 mg total) by mouth 2 (two) times daily. NEED TO SCHEDULE OFFICE VISIT WITH PCP 06/03/16   Sherlene Shams, MD  sucralfate (CARAFATE) 1 g tablet Take 1 tablet (1 g total) by mouth 2 (two) times daily. 08/28/15   Sherlene Shams, MD  Tdap Leda Min) 5-2.5-18.5 LF-MCG/0.5 injection Inject 0.5 mLs into the muscle once. 06/06/14   Sherlene Shams, MD  Tdap Leda Min) 5-2.5-18.5 LF-MCG/0.5 injection Inject 0.5 mLs into the muscle once. 07/28/15   Sherlene Shams, MD  XARELTO 15 MG TABS tablet TAKE 1 TABLET DAILY 11/06/15   Sherlene Shams, MD    Allergies Neomycin  Family History  Problem Relation Age of Onset  . Hypertension Mother   . Stroke Mother   . Heart disease Father 21    AMI  . Hypertension Father   . Vision loss Paternal Aunt   . Cancer Neg Hx     Social History Social History  Substance Use Topics  . Smoking status: Never Smoker  . Smokeless tobacco: Never Used  . Alcohol use No    Review of Systems Constitutional: No fever/chills Eyes: No visual changes. ENT: No sore throat. No stiff neck no neck pain Cardiovascular: Denies chest pain. Respiratory: Denies shortness of breath. Gastrointestinal:   no vomiting.  No diarrhea.  No constipation. Genitourinary: Negative for  dysuria. Musculoskeletal: Negative lower extremity swelling Skin: Negative for rash. Neurological: Negative for severe headaches, focal weakness or numbness. 10-point ROS otherwise negative.  ____________________________________________   PHYSICAL EXAM:  VITAL SIGNS: ED Triage Vitals  Enc Vitals Group     BP 07/07/16 0719 (!) 186/97     Pulse Rate 07/07/16 0719 82     Resp 07/07/16 0719 18     Temp 07/07/16 0719 98 F (36.7 C)     Temp Source 07/07/16 0719 Oral     SpO2 07/07/16 0719 97 %     Weight 07/07/16 0718 115 lb (52.2 kg)     Height 07/07/16 0718 5\' 7"  (1.702 m)     Head Circumference --      Peak Flow --  Pain Score --      Pain Loc --      Pain Edu? --      Excl. in GC? --     Constitutional: Alert and oriented. Well appearing and in no acute distress. Eyes: Conjunctivae are normal. PERRL. EOMI. Head: Atraumatic. Nose: No congestion/rhinnorhea. Mouth/Throat: Mucous membranes are moist.  Oropharynx non-erythematous. Neck: No stridor.   Nontender with no meningismus Cardiovascular: Normal rate, regular rhythm. Grossly normal heart sounds.  Good peripheral circulation. Respiratory: Normal respiratory effort.  No retractions. Lungs CTAB. Abdominal: Soft and nontender. No distention. No guarding no rebound Back:  There is no focal tenderness or step off.  there is no midline tenderness there are no lesions noted. there is no CVA tenderness Musculoskeletal: No lower extremity tenderness, no upper extremity tenderness. No joint effusions, no DVT signs strong distal pulses no edema Neurologic:  Normal speech and language. No gross focal neurologic deficits are appreciated.  Skin:  Skin is warm, dry and intact. No rash noted. Psychiatric: Mood and affect are normal. Speech and behavior are normal.  ____________________________________________   LABS (all labs ordered are listed, but only abnormal results are displayed)  Labs Reviewed - No data to  display ____________________________________________  EKG  I personally interpreted any EKGs ordered by me or triage Known A. fib rate 70 bpm, well-controlled, and normal axis no acute ST elevation or depression, borderline LVH. ____________________________________________  RADIOLOGY  I reviewed any imaging ordered by me or triage that were performed during my shift and, if possible, patient and/or family made aware of any abnormal findings. ____________________________________________   PROCEDURES  Procedure(s) performed: None  Procedures  Critical Care performed: None  ____________________________________________   INITIAL IMPRESSION / ASSESSMENT AND PLAN / ED COURSE  Pertinent labs & imaging results that were available during my care of the patient were reviewed by me and considered in my medical decision making (see chart for details).  Patient here with asymptomatic hypertension, blood pressures in the 170s. Baseline is 130s to 140s and sounds like. We will giver her home medications. I'll hold off on giving her Catapres at this time as I do not wish to drop her pressure too far, she has no other indications for imaging blood work or EKG at this moment. Asymptomatic hypertension. Do not feel that a few inches of tingling on her scalp represents referred cardiac pain or stroke and I don't think she requires further workup for it at this time.  ----------------------------------------- 8:44 AM on 07/07/2016 -----------------------------------------  The pressure coming down well, patient with no symptoms or complaints. She would like to go home. She has all of her medications at home. We will discharge her with close outpatient follow-up. No chest pain or shortness of breath no nausea no vomiting no numbness no weakness no headache no concerning symptoms at this time  Clinical Course    ____________________________________________   FINAL CLINICAL IMPRESSION(S) / ED  DIAGNOSES  Final diagnoses:  None      This chart was dictated using voice recognition software.  Despite best efforts to proofread,  errors can occur which can change meaning.      Jeanmarie PlantJames A Sir Mallis, MD 07/07/16 [id number]    Jeanmarie PlantJames A Charnelle Bergeman, MD 07/07/16 [phone]

## 2016-07-07 NOTE — ED Triage Notes (Signed)
Patient to ED via POV, patient states that this morning when she woke up her blood pressure was 189/111. Patient denies headache, blurry vision, or chest pain. Patient states that she does have "numbness" in the back of her head on the left side. Patient denies unilateral weakness or numbness, denies changes in speech or mental status, no facial droop noted.

## 2016-07-19 ENCOUNTER — Encounter: Payer: Self-pay | Admitting: *Deleted

## 2016-07-23 NOTE — Discharge Instructions (Signed)
Cataract Surgery, Care After °Refer to this sheet in the next few weeks. These instructions provide you with information about caring for yourself after your procedure. Your health care provider may also give you more specific instructions. Your treatment has been planned according to current medical practices, but problems sometimes occur. Call your health care provider if you have any problems or questions after your procedure. °What can I expect after the procedure? °After the procedure, it is common to have: °· Itching. °· Discomfort. °· Fluid discharge. °· Sensitivity to light and to touch. °· Bruising. °Follow these instructions at home: °Eye Care  °· Check your eye every day for signs of infection. Watch for: °¨ Redness, swelling, or pain. °¨ Fluid, blood, or pus. °¨ Warmth. °¨ Bad smell. °Activity  °· Avoid strenuous activities, such as playing contact sports, for as long as told by your health care provider. °· Do not drive or operate heavy machinery until your health care provider approves. °· Do not bend or lift heavy objects . Bending increases pressure in the eye. You can walk, climb stairs, and do light household chores. °· Ask your health care provider when you can return to work. If you work in a dusty environment, you may be advised to wear protective eyewear for a period of time. °General instructions  °· Take or apply over-the-counter and prescription medicines only as told by your health care provider. This includes eye drops. °· Do not touch or rub your eyes. °· If you were given a protective shield, wear it as told by your health care provider. If you were not given a protective shield, wear sunglasses as told by your health care provider to protect your eyes. °· Keep the area around your eye clean and dry. Avoid swimming or allowing water to hit you directly in the face while showering until told by your health care provider. Keep soap and shampoo out of your eyes. °· Do not put a contact lens  into the affected eye or eyes until your health care provider approves. °· Keep all follow-up visits as told by your health care provider. This is important. °Contact a health care provider if: ° °· You have increased bruising around your eye. °· You have pain that is not helped with medicine. °· You have a fever. °· You have redness, swelling, or pain in your eye. °· You have fluid, blood, or pus coming from your incision. °· Your vision gets worse. °Get help right away if: °· You have sudden vision loss. °This information is not intended to replace advice given to you by your health care provider. Make sure you discuss any questions you have with your health care provider. °Document Released: 01/25/2005 Document Revised: 11/16/2015 Document Reviewed: 05/18/2015 °Elsevier Interactive Patient Education © 2017 [employer] ° ° ° ° °General Anesthesia, Adult, Care After °These instructions provide you with information about caring for yourself after your procedure. Your health care provider may also give you more specific instructions. Your treatment has been planned according to current medical practices, but problems sometimes occur. Call your health care provider if you have any problems or questions after your procedure. °What can I expect after the procedure? °After the procedure, it is common to have: °· Vomiting. °· A sore throat. °· Mental slowness. °It is common to feel: °· Nauseous. °· Cold or shivery. °· Sleepy. °· Tired. °· Sore or achy, even in parts of your body where you did not have surgery. °Follow these instructions at   home: °For at least 24 hours after the procedure:  °· Do not: °¨ Participate in activities where you could fall or become injured. °¨ Drive. °¨ Use heavy machinery. °¨ Drink alcohol. °¨ Take sleeping pills or medicines that cause drowsiness. °¨ Make important decisions or sign legal documents. °¨ Take care of children on your own. °· Rest. °Eating and drinking  °· If you vomit, drink  water, juice, or soup when you can drink without vomiting. °· Drink enough fluid to keep your urine clear or pale yellow. °· Make sure you have little or no nausea before eating solid foods. °· Follow the diet recommended by your health care provider. °General instructions  °· Have a responsible adult stay with you until you are awake and alert. °· Return to your normal activities as told by your health care provider. Ask your health care provider what activities are safe for you. °· Take over-the-counter and prescription medicines only as told by your health care provider. °· If you smoke, do not smoke without supervision. °· Keep all follow-up visits as told by your health care provider. This is important. °Contact a health care provider if: °· You continue to have nausea or vomiting at home, and medicines are not helpful. °· You cannot drink fluids or start eating again. °· You cannot urinate after 8-12 hours. °· You develop a skin rash. °· You have fever. °· You have increasing redness at the site of your procedure. °Get help right away if: °· You have difficulty breathing. °· You have chest pain. °· You have unexpected bleeding. °· You feel that you are having a life-threatening or urgent problem. °This information is not intended to replace advice given to you by your health care provider. Make sure you discuss any questions you have with your health care provider. °Document Released: 10/14/2000 Document Revised: 12/11/2015 Document Reviewed: 06/22/2015 °Elsevier Interactive Patient Education © 2017 [employer] ° °

## 2016-07-27 ENCOUNTER — Other Ambulatory Visit: Payer: Self-pay | Admitting: Internal Medicine

## 2016-07-29 ENCOUNTER — Ambulatory Visit: Payer: Medicare Other | Admitting: Anesthesiology

## 2016-07-29 ENCOUNTER — Ambulatory Visit
Admission: RE | Admit: 2016-07-29 | Discharge: 2016-07-29 | Disposition: A | Discharge status: Home / Self Care | Payer: Medicare Other | Source: Ambulatory Visit | Attending: Ophthalmology | Admitting: Ophthalmology

## 2016-07-29 ENCOUNTER — Encounter
Admission: RE | Disposition: A | Discharge status: Home / Self Care | Payer: Self-pay | Source: Ambulatory Visit | Attending: Ophthalmology

## 2016-07-29 DIAGNOSIS — J449 Chronic obstructive pulmonary disease, unspecified: Secondary | ICD-10-CM | POA: Insufficient documentation

## 2016-07-29 DIAGNOSIS — Z7901 Long term (current) use of anticoagulants: Secondary | ICD-10-CM | POA: Insufficient documentation

## 2016-07-29 DIAGNOSIS — E039 Hypothyroidism, unspecified: Secondary | ICD-10-CM | POA: Diagnosis not present

## 2016-07-29 DIAGNOSIS — K219 Gastro-esophageal reflux disease without esophagitis: Secondary | ICD-10-CM | POA: Insufficient documentation

## 2016-07-29 DIAGNOSIS — H2511 Age-related nuclear cataract, right eye: Secondary | ICD-10-CM | POA: Diagnosis not present

## 2016-07-29 DIAGNOSIS — E785 Hyperlipidemia, unspecified: Secondary | ICD-10-CM | POA: Insufficient documentation

## 2016-07-29 DIAGNOSIS — I4891 Unspecified atrial fibrillation: Secondary | ICD-10-CM | POA: Diagnosis not present

## 2016-07-29 DIAGNOSIS — I10 Essential (primary) hypertension: Secondary | ICD-10-CM | POA: Insufficient documentation

## 2016-07-29 DIAGNOSIS — Z79899 Other long term (current) drug therapy: Secondary | ICD-10-CM | POA: Insufficient documentation

## 2016-07-29 HISTORY — DX: Presence of dental prosthetic device (complete) (partial): Z97.2

## 2016-07-29 HISTORY — DX: Presence of external hearing-aid: Z97.4

## 2016-07-29 HISTORY — PX: CATARACT EXTRACTION W/PHACO: SHX586

## 2016-07-29 SURGERY — PHACOEMULSIFICATION, CATARACT, WITH IOL INSERTION
Anesthesia: Monitor Anesthesia Care | Site: Eye | Laterality: Right | Wound class: Clean

## 2016-07-29 MED ORDER — LIDOCAINE HCL (PF) 4 % IJ SOLN
INTRAMUSCULAR | Status: DC | PRN
  Administered 2016-07-29: 1 mL via OPHTHALMIC

## 2016-07-29 MED ORDER — FENTANYL CITRATE (PF) 100 MCG/2ML IJ SOLN
INTRAMUSCULAR | Status: DC | PRN
  Administered 2016-07-29: 50 ug via INTRAVENOUS

## 2016-07-29 MED ORDER — BRIMONIDINE TARTRATE-TIMOLOL 0.2-0.5 % OP SOLN
OPHTHALMIC | Status: DC | PRN
  Administered 2016-07-29: 1 [drp] via OPHTHALMIC

## 2016-07-29 MED ORDER — MIDAZOLAM HCL 2 MG/2ML IJ SOLN
INTRAMUSCULAR | Status: DC | PRN
  Administered 2016-07-29: 1 mg via INTRAVENOUS

## 2016-07-29 MED ORDER — CEFUROXIME OPHTHALMIC INJECTION 1 MG/0.1 ML
INJECTION | OPHTHALMIC | Status: DC | PRN
  Administered 2016-07-29: .3 mL via OPHTHALMIC

## 2016-07-29 MED ORDER — NA HYALUR & NA CHOND-NA HYALUR 0.4-0.35 ML IO KIT
PACK | INTRAOCULAR | Status: DC | PRN
  Administered 2016-07-29: 1 mL via INTRAOCULAR

## 2016-07-29 MED ORDER — BSS IO SOLN
INTRAOCULAR | Status: DC | PRN
  Administered 2016-07-29: 72 mL via OPHTHALMIC

## 2016-07-29 MED ORDER — ARMC OPHTHALMIC DILATING DROPS
1.0000 "application " | OPHTHALMIC | Status: DC | PRN
  Administered 2016-07-29 (×3): 1 via OPHTHALMIC

## 2016-07-29 MED ORDER — MOXIFLOXACIN HCL 0.5 % OP SOLN
1.0000 [drp] | OPHTHALMIC | Status: DC | PRN
  Administered 2016-07-29 (×3): 1 [drp] via OPHTHALMIC

## 2016-07-29 SURGICAL SUPPLY — 29 items
APPLICATOR COTTON TIP 3IN (MISCELLANEOUS) ×3 IMPLANT
CANNULA ANT/CHMB 27GA (MISCELLANEOUS) ×3 IMPLANT
DISSECTOR HYDRO NUCLEUS 50X22 (MISCELLANEOUS) ×3 IMPLANT
GLOVE BIO SURGEON STRL SZ7 (GLOVE) ×3 IMPLANT
GLOVE SURG LX 6.5 MICRO (GLOVE) ×2
GLOVE SURG LX STRL 6.5 MICRO (GLOVE) ×1 IMPLANT
GOWN STRL REUS W/ TWL LRG LVL3 (GOWN DISPOSABLE) ×2 IMPLANT
GOWN STRL REUS W/TWL LRG LVL3 (GOWN DISPOSABLE) ×4
LENS IOL ACRSF IQ ULTRA 18.5 (Intraocular Lens) ×1 IMPLANT
LENS IOL ACRYSOF IQ 18.5 (Intraocular Lens) ×3 IMPLANT
MARKER SKIN DUAL TIP RULER LAB (MISCELLANEOUS) ×3 IMPLANT
NEEDLE FILTER BLUNT 18X 1/2SAF (NEEDLE) ×4
NEEDLE FILTER BLUNT 18X1 1/2 (NEEDLE) ×2 IMPLANT
PACK CATARACT BRASINGTON (MISCELLANEOUS) ×3 IMPLANT
PACK EYE AFTER SURG (MISCELLANEOUS) ×3 IMPLANT
PACK OPTHALMIC (MISCELLANEOUS) ×3 IMPLANT
RING MALYGIN 7.0 (MISCELLANEOUS) IMPLANT
SOL BAL SALT 15ML (MISCELLANEOUS)
SOLUTION BAL SALT 15ML (MISCELLANEOUS) IMPLANT
SUT ETHILON 10-0 CS-B-6CS-B-6 (SUTURE)
SUT VICRYL  9 0 (SUTURE)
SUT VICRYL 9 0 (SUTURE) IMPLANT
SUTURE EHLN 10-0 CS-B-6CS-B-6 (SUTURE) IMPLANT
SYR 3ML LL SCALE MARK (SYRINGE) ×6 IMPLANT
SYR TB 1ML LUER SLIP (SYRINGE) ×3 IMPLANT
WATER STERILE IRR 250ML POUR (IV SOLUTION) ×3 IMPLANT
WATER STERILE IRR 500ML POUR (IV SOLUTION) IMPLANT
WICK EYE OCUCEL (MISCELLANEOUS) IMPLANT
WIPE NON LINTING 3.25X3.25 (MISCELLANEOUS) ×3 IMPLANT

## 2016-07-29 NOTE — H&P (Signed)
H+P reviewed and is up to date, please see paper chart.  

## 2016-07-29 NOTE — Anesthesia Preprocedure Evaluation (Addendum)
Anesthesia Evaluation  Patient identified by MRN, date of birth, ID band Patient awake    Reviewed: Allergy & Precautions, H&P , NPO status , Patient's Chart, lab work & pertinent test results, reviewed documented beta blocker date and time   Airway Mallampati: II  TM Distance: >3 FB Neck ROM: full    Dental  (+) Partial Upper   Pulmonary COPD,    Pulmonary exam normal breath sounds clear to auscultation       Cardiovascular Exercise Tolerance: Good hypertension, + Peripheral Vascular Disease   Rhythm:regular Rate:Normal     Neuro/Psych TIAnegative psych ROS   GI/Hepatic Neg liver ROS, GERD  ,  Endo/Other  Hypothyroidism   Renal/GU negative Renal ROS  negative genitourinary   Musculoskeletal   Abdominal   Peds  Hematology negative hematology ROS (+)   Anesthesia Other Findings   Reproductive/Obstetrics negative OB ROS                             Anesthesia Physical Anesthesia Plan  ASA: III  Anesthesia Plan: MAC   Post-op Pain Management:    Induction:   Airway Management Planned:   Additional Equipment:   Intra-op Plan:   Post-operative Plan:   Informed Consent: I have reviewed the patients History and Physical, chart, labs and discussed the procedure including the risks, benefits and alternatives for the proposed anesthesia with the patient or authorized representative who has indicated his/her understanding and acceptance.   Dental Advisory Given  Plan Discussed with: CRNA  Anesthesia Plan Comments:         Anesthesia Quick Evaluation

## 2016-07-29 NOTE — Anesthesia Procedure Notes (Signed)
Procedure Name: MAC Date/Time: 07/29/2016 10:51 AM Performed by: Janna Arch Pre-anesthesia Checklist: Patient identified, Emergency Drugs available, Suction available and Patient being monitored Patient Re-evaluated:Patient Re-evaluated prior to inductionOxygen Delivery Method: Nasal cannula

## 2016-07-29 NOTE — Telephone Encounter (Signed)
Last TSH 07/28/15 ok to fill?

## 2016-07-29 NOTE — Transfer of Care (Signed)
Immediate Anesthesia Transfer of Care Note  Patient: Tiffany Grimes  Procedure(s) Performed: Procedure(s) with comments: CATARACT EXTRACTION PHACO AND INTRAOCULAR LENS PLACEMENT (IOC) (Right) - right  Patient Location: PACU  Anesthesia Type: MAC  Level of Consciousness: awake, alert  and patient cooperative  Airway and Oxygen Therapy: Patient Spontanous Breathing and Patient connected to supplemental oxygen  Post-op Assessment: Post-op Vital signs reviewed, Patient's Cardiovascular Status Stable, Respiratory Function Stable, Patent Airway and No signs of Nausea or vomiting  Post-op Vital Signs: Reviewed and stable  Complications: No apparent anesthesia complications

## 2016-07-29 NOTE — Op Note (Signed)
Date of Surgery: 07/29/2016  PREOPERATIVE DIAGNOSES: Visually significant nuclear sclerotic cataract, right eye.  POSTOPERATIVE DIAGNOSES: Same  PROCEDURES PERFORMED: Cataract extraction with intraocular lens implant, right eye.  SURGEON: Almon Hercules, M.D.  ANESTHESIA: MAC and topical  IMPLANTS: [id number] +18.5 D  Implant Name Type Inv. Item Serial No. Manufacturer Lot No. LRB No. Used  LENS IOL ACRYSOF IQ 18.5 - [id number] Intraocular Lens LENS IOL ACRYSOF IQ 18.5 [id number] [employer]   Right 1     COMPLICATIONS: None.  DESCRIPTION OF PROCEDURE: Therapeutic options were discussed with the patient preoperatively, including a discussion of risks and benefits of surgery. Informed consent was obtained. An IOL-Master and immersion biometry were used to take the lens measurements, and a dilated fundus exam was performed within 6 months of the surgical date.  The patient was premedicated and brought to the operating room and placed on the operating table in the supine position. After adequate anesthesia, the patient was prepped and draped in the usual sterile ophthalmic fashion. A wire lid speculum was inserted and the microscope was positioned. A Superblade was used to create a paracentesis site at the limbus and a small amount of dilute preservative free lidocaine was instilled into the anterior chamber, followed by dispersive viscoelastic. A clear corneal incision was created temporally using a 2.4 mm keratome blade. Capsulorrhexis was then performed. In situ phacoemulsification was performed.  Cortical material was removed with the irrigation-aspiration unit. Dispersive viscoelastic was instilled to open the capsular bag. A posterior chamber intraocular lens with the specifications above was inserted and positioned. Irrigation-aspiration was used to remove all viscoelastic. Cefuroxime 1cc was instilled into the anterior chamber, and the corneal incision was checked and found to be water tight. The  eyelid speculum was removed.  The operative eye was covered with protective goggles after instilling 1 drop of timolol and brimonidine. The patient tolerated the procedure well. There were no complications.

## 2016-07-29 NOTE — Anesthesia Postprocedure Evaluation (Signed)
Anesthesia Post Note  Patient: Tiffany Grimes  Procedure(s) Performed: Procedure(s) (LRB): CATARACT EXTRACTION PHACO AND INTRAOCULAR LENS PLACEMENT (IOC) (Right)  Patient location during evaluation: PACU Anesthesia Type: MAC Level of consciousness: awake and alert Pain management: pain level controlled Vital Signs Assessment: post-procedure vital signs reviewed and stable Respiratory status: spontaneous breathing, nonlabored ventilation, respiratory function stable and patient connected to nasal cannula oxygen Cardiovascular status: stable and blood pressure returned to baseline Anesthetic complications: no    Alisa Graff

## 2016-07-30 ENCOUNTER — Encounter: Payer: Self-pay | Admitting: Ophthalmology

## 2016-08-02 ENCOUNTER — Other Ambulatory Visit: Payer: Self-pay | Admitting: Internal Medicine

## 2016-08-02 NOTE — Telephone Encounter (Signed)
Please advise Xarelto Last refilled 10/2014

## 2016-09-06 ENCOUNTER — Other Ambulatory Visit: Payer: Self-pay | Admitting: Internal Medicine

## 2016-09-06 DIAGNOSIS — I10 Essential (primary) hypertension: Secondary | ICD-10-CM

## 2016-09-06 NOTE — Telephone Encounter (Signed)
Denied.  I have not seen her in over a year and she has not had renal assessment so I cannot fill until she has at least a bmet which I have ordered

## 2016-09-06 NOTE — Telephone Encounter (Signed)
Doesn't look like pt has scheduled OV with PCP since last refill. Last OV was 07/28/15.

## 2016-09-18 ENCOUNTER — Other Ambulatory Visit: Payer: Self-pay | Admitting: Internal Medicine

## 2016-09-24 ENCOUNTER — Other Ambulatory Visit: Payer: Self-pay | Admitting: Internal Medicine

## 2017-02-27 ENCOUNTER — Other Ambulatory Visit (INDEPENDENT_AMBULATORY_CARE_PROVIDER_SITE_OTHER): Payer: Self-pay | Admitting: Vascular Surgery

## 2017-02-27 DIAGNOSIS — I6523 Occlusion and stenosis of bilateral carotid arteries: Secondary | ICD-10-CM

## 2017-02-28 ENCOUNTER — Ambulatory Visit (INDEPENDENT_AMBULATORY_CARE_PROVIDER_SITE_OTHER): Payer: Medicare Other | Admitting: Vascular Surgery

## 2017-02-28 ENCOUNTER — Ambulatory Visit (INDEPENDENT_AMBULATORY_CARE_PROVIDER_SITE_OTHER): Payer: Medicare Other

## 2017-02-28 ENCOUNTER — Encounter (INDEPENDENT_AMBULATORY_CARE_PROVIDER_SITE_OTHER): Payer: Self-pay | Admitting: Vascular Surgery

## 2017-02-28 VITALS — BP 139/82 | HR 69 | Resp 16 | Wt 118.0 lb

## 2017-02-28 DIAGNOSIS — I6523 Occlusion and stenosis of bilateral carotid arteries: Secondary | ICD-10-CM | POA: Diagnosis not present

## 2017-02-28 DIAGNOSIS — E785 Hyperlipidemia, unspecified: Secondary | ICD-10-CM

## 2017-02-28 DIAGNOSIS — I6529 Occlusion and stenosis of unspecified carotid artery: Secondary | ICD-10-CM | POA: Insufficient documentation

## 2017-02-28 DIAGNOSIS — I10 Essential (primary) hypertension: Secondary | ICD-10-CM | POA: Diagnosis not present

## 2017-02-28 NOTE — Assessment & Plan Note (Signed)
blood pressure control important in reducing the progression of atherosclerotic disease. On appropriate oral medications.  

## 2017-02-28 NOTE — Assessment & Plan Note (Signed)
Her carotid duplex demonstrates bilateral 1-39% carotid artery stenosis with no progression from her previous study. She should continue her aspirin and statin agent. I will plan to see her back in 2 years with carotid duplex this point.

## 2017-02-28 NOTE — Assessment & Plan Note (Signed)
lipid control important in reducing the progression of atherosclerotic disease. Continue statin therapy  

## 2017-02-28 NOTE — Patient Instructions (Signed)
Carotid Artery Disease The carotid arteries are the two main arteries on either side of the neck that supply blood to the brain. Carotid artery disease, also called carotid artery stenosis, is the narrowing or blockage of one or both carotid arteries. Carotid artery disease increases your risk for a stroke or a transient ischemic attack (TIA). A TIA is an episode in which a waxy, fatty substance that accumulates within the artery (plaque) blocks blood flow to the brain. A TIA is considered a "warning stroke." What are the causes?  Buildup of plaque inside the carotid arteries (atherosclerosis) (common).  A weakened outpouching in an artery (aneurysm).  Inflammation of the carotid artery (arteritis).  A fibrous growth within the carotid artery (fibromuscular dysplasia).  Tissue death within the carotid artery due to radiation treatment (post-radiation necrosis).  Decreased blood flow due to spasms of the carotid artery (vasospasm).  Separation of the walls of the carotid artery (carotid dissection). What increases the risk?  High cholesterol (dyslipidemia).  High blood pressure (hypertension).  Smoking.  Obesity.  Diabetes.  Family history of cardiovascular disease.  Inactivity or lack of regular exercise.  Being female. Men have an increased risk of developing atherosclerosis earlier in life than women. What are the signs or symptoms? Carotid artery disease does not cause symptoms. How is this diagnosed? Diagnosis of carotid artery disease may include:  A physical exam. Your health care provider may hear an abnormal sound (bruit) when listening to the carotid arteries.  Specific tests that look at the blood flow in the carotid arteries. These tests include: ? Carotid artery ultrasonography. ? Carotid or cerebral angiography. ? Computerized tomographic angiography (CTA). ? Magnetic resonance angiography (MRA).  How is this treated? Treatment of carotid artery disease can  include a combination of treatments. Treatment options include:  Surgery. You may have: ? A carotid endarterectomy. This is a surgery to remove the blockages in the carotid arteries. ? A carotid angioplasty with stenting. This is a nonsurgical interventional procedure. A wire mesh (stent) is used to widen the blocked carotid arteries.  Medicines to control blood pressure, cholesterol, and reduce blood clotting (antiplatelet therapy).  Adjusting your diet.  Lifestyle changes such as: ? Quitting smoking. ? Exercising as tolerated or as directed by your health care provider. ? Controlling and maintaining a good blood pressure. ? Keeping cholesterol levels under control.  Follow these instructions at home:  Take medicines only as directed by your health care provider. Make sure you understand all your medicine instructions. Do not stop your medicines without talking to your health care provider.  Follow your health care provider's diet instructions. It is important to eat a healthy diet that is low in saturated fats and includes plenty of fresh fruits, vegetables, and lean meats. High-fat, high-sodium foods as well as foods that are fried, overly processed, or have poor nutritional value should be avoided.  Maintain a healthy weight.  Stay physically active. It is recommended that you get at least 30 minutes of activity every day.  Do not use any tobacco products including cigarettes, chewing tobacco, or electronic cigarettes. If you need help quitting, ask your health care provider.  Limit alcohol use to: ? No more than 2 drinks per day for men. ? No more than 1 drink per day for nonpregnant women.  Do not use illegal drugs.  Keep all follow-up visits as directed by your health care provider. Get help right away if: You develop TIA or stroke symptoms. These include:  Sudden   weakness or numbness on one side of the body, such as in the face, arm, or leg.  Sudden  confusion.  Trouble speaking (aphasia) or understanding.  Sudden trouble seeing out of one or both eyes.  Sudden trouble walking.  Dizziness or feeling like you might faint.  Loss of balance or coordination.  Sudden severe headache with no known cause.  Sudden trouble swallowing (dysphagia).  If you have any of these symptoms, call your local emergency services (911 in [place] Do not drive yourself to the clinic or hospital. This is a medical emergency. This information is not intended to replace advice given to you by your health care provider. Make sure you discuss any questions you have with your health care provider. Document Released: 09/30/2011 Document Revised: 12/14/2015 Document Reviewed: 01/06/2013 Elsevier Interactive Patient Education  2017 [employer]  

## 2017-02-28 NOTE — Progress Notes (Signed)
MRN : [id number]  Tiffany Grimes is a 83 y.o. (11-25-1928) female who presents with chief complaint of  Chief Complaint  Patient presents with  . Follow-up  .  History of Present Illness: Patient returns in follow-up for carotid disease. She is doing well without any specific complaints related to the carotid arteries today. Specifically, the patient denies amaurosis fugax, speech or swallowing difficulties, or arm or leg weakness or numbness. Her carotid duplex demonstrates bilateral 1-39% carotid artery stenosis with no progression from her previous study.    Current Outpatient Prescriptions  Medication Sig Dispense Refill  . amLODipine (NORVASC) 5 MG tablet TAKE 1 TABLET BY MOUTH DAILY. 30 tablet 2  . atenolol (TENORMIN) 25 MG tablet TAKE 1 TABLET TWICE A DAY 180 tablet 1  . atorvastatin (LIPITOR) 20 MG tablet TAKE 1 TABLET DAILY 90 tablet 2  . brimonidine (ALPHAGAN P) 0.1 % SOLN Place 1 drop into both eyes 2 (two) times daily.      . cloNIDine (CATAPRES) 0.1 MG tablet Take 1 tablet (0.1 mg total) by mouth 2 (two) times daily. As needed for BP > 150 60 tablet 0  . esomeprazole (NEXIUM) 40 MG capsule TAKE 1 CAPSULE DAILY BEFORE BREAKFAST 90 capsule 1  . fluticasone (FLONASE) 50 MCG/ACT nasal spray Place 2 sprays into both nostrils daily as needed. 2 sprays into each nostril daily 16 g 0  . latanoprost (XALATAN) 0.005 % ophthalmic solution Place 1 drop into both eyes 2 (two) times daily.      Marland Kitchen levothyroxine (SYNTHROID, LEVOTHROID) 50 MCG tablet TAKE 1 TABLET BY MOUTH DAILY 90 tablet 0  . sucralfate (CARAFATE) 1 g tablet Take 1 tablet (1 g total) by mouth 2 (two) times daily. 180 tablet 3  . XARELTO 15 MG TABS tablet TAKE 1 TABLET DAILY 90 tablet 2  . escitalopram (LEXAPRO) 10 MG tablet TAKE 1 TABLET BY MOUTH DAILY. (Patient not taking: Reported on 02/28/2017) 30 tablet 2  . glucose blood test strip Use as instructed (Patient not taking: Reported on 07/29/2016) 50 each 6  . lisinopril  (PRINIVIL,ZESTRIL) 10 MG tablet Take 1 tablet (10 mg total) by mouth 2 (two) times daily. NEED TO SCHEDULE OFFICE VISIT WITH PCP (Patient not taking: Reported on 02/28/2017) 180 tablet 0  . Tdap (BOOSTRIX) 5-2.5-18.5 LF-MCG/0.5 injection Inject 0.5 mLs into the muscle once. (Patient not taking: Reported on 02/28/2017) 0.5 mL 0  . Tdap (BOOSTRIX) 5-2.5-18.5 LF-MCG/0.5 injection Inject 0.5 mLs into the muscle once. (Patient not taking: Reported on 02/28/2017) 0.5 mL 0   No current facility-administered medications for this visit.     Past Medical History:  Diagnosis Date  . Atrial fibrillation (HCC)   . Barrett esophagus    due for EGD July 2012, [place]  . Hx of thyroid nodule July 2012   benign biopsy   . Hypertension   . Lentigo   . Reflux   . Thyroid disease   . TIA (transient ischemic attack) June 2012  . TIA (transient ischemic attack) June 2012  . Wears dentures    partial upper  . Wears hearing aid     Past Surgical History:  Procedure Laterality Date  . ABDOMINAL HYSTERECTOMY    . CATARACT EXTRACTION W/PHACO Right 07/29/2016   Procedure: CATARACT EXTRACTION PHACO AND INTRAOCULAR LENS PLACEMENT (IOC);  Surgeon: Sherald Hess, MD;  Location: [facility];  Service: Ophthalmology;  Laterality: Right;  right  . CESAREAN SECTION    . HERNIA REPAIR  2008   left inguinal  . THYROID SURGERY  june 18   nodule removed    Social History Social History  Substance Use Topics  . Smoking status: Never Smoker  . Smokeless tobacco: Never Used  . Alcohol use No    Family History Family History  Problem Relation Age of Onset  . Hypertension Mother   . Stroke Mother   . Heart disease Father 21       AMI  . Hypertension Father   . Vision loss Paternal Aunt   . Cancer Neg Hx     Allergies  Allergen Reactions  . Neomycin Itching     REVIEW OF SYSTEMS (Negative unless checked)  Constitutional: [] Weight loss  [] Fever  [] Chills Cardiac: [] Chest pain    [] Chest pressure   [] Palpitations   [] Shortness of breath when laying flat   [] Shortness of breath at rest   [] Shortness of breath with exertion. Vascular:  [] Pain in legs with walking   [] Pain in legs at rest   [] Pain in legs when laying flat   [] Claudication   [] Pain in feet when walking  [] Pain in feet at rest  [] Pain in feet when laying flat   [] History of DVT   [] Phlebitis   [] Swelling in legs   [] Varicose veins   [] Non-healing ulcers Pulmonary:   [] Uses home oxygen   [] Productive cough   [] Hemoptysis   [] Wheeze  [] COPD   [] Asthma Neurologic:  [] Dizziness  [] Blackouts   [] Seizures   [] History of stroke   [] History of TIA  [] Aphasia   [] Temporary blindness   [] Dysphagia   [] Weakness or numbness in arms   [] Weakness or numbness in legs Musculoskeletal:  [x] Arthritis   [] Joint swelling   [] Joint pain   [] Low back pain Hematologic:  [] Easy bruising  [] Easy bleeding   [] Hypercoagulable state   [] Anemic  [] Hepatitis Gastrointestinal:  [] Blood in stool   [] Vomiting blood  [] Gastroesophageal reflux/heartburn   [] Difficulty swallowing. Genitourinary:  [] Chronic kidney disease   [] Difficult urination  [] Frequent urination  [] Burning with urination   [] Blood in urine Skin:  [] Rashes   [] Ulcers   [] Wounds Psychological:  [] History of anxiety   []  History of major depression.  Physical Examination  Vitals:   02/28/17 1345  BP: 139/82  Pulse: 69  Resp: 16  Weight: 118 lb (53.5 kg)   Body mass index is 18.48 kg/m. Gen:  Thin. NAD. Appears younger than stated age. Head: [place]/AT, No temporalis wasting. Ear/Nose/Throat: Hearing grossly intact, nares w/o erythema or drainage, trachea midline Eyes: Conjunctiva clear. Sclera non-icteric Neck: Supple.  No bruit or JVD.  Pulmonary:  Good air movement, equal and clear to auscultation bilaterally.  Cardiac: irregularly irregular Vascular:  Vessel Right Left  Radial Palpable Palpable                                    Musculoskeletal: M/S 5/5  throughout.  No deformity or atrophy.  Neurologic: CN 2-12 intact. Sensation grossly intact in extremities.  Symmetrical.  Speech is fluent. Motor exam as listed above. Psychiatric: Judgment intact, Mood & affect appropriate for pt's clinical situation. Dermatologic: No rashes or ulcers noted.  No cellulitis or open wounds.      CBC Lab Results  Component Value Date   WBC 5.0 12/07/2014   HGB 13.2 12/07/2014   HCT 41.5 12/07/2014   MCV 86.1 12/07/2014   PLT 207 12/07/2014  BMET    Component Value Date/Time   NA 138 07/28/2015 1053   NA 135 10/25/2014 1411   K 4.5 07/28/2015 1053   K 4.0 10/25/2014 1411   CL 101 07/28/2015 1053   CL 98 (L) 10/25/2014 1411   CO2 32 07/28/2015 1053   CO2 29 10/25/2014 1411   GLUCOSE 94 07/28/2015 1053   GLUCOSE 110 (H) 10/25/2014 1411   BUN 16 07/28/2015 1053   BUN 17 10/25/2014 1411   CREATININE 0.86 07/28/2015 1053   CREATININE 0.88 10/25/2014 1411   CALCIUM 9.4 07/28/2015 1053   CALCIUM 9.2 10/25/2014 1411   GFRNONAA 49 (L) 12/07/2014 0658   GFRNONAA >60 10/25/2014 1411   GFRAA 57 (L) 12/07/2014 0658   GFRAA >60 10/25/2014 1411   CrCl cannot be calculated (Patient's most recent lab result is older than the maximum 21 days allowed.).  COAG Lab Results  Component Value Date   INR 2.2 07/19/2014   INR 2.5 12/18/2013   INR 2.17 (H) 06/30/2013    Radiology No results found.    Assessment/Plan Hypertension blood pressure control important in reducing the progression of atherosclerotic disease. On appropriate oral medications.   Hyperlipidemia LDL goal <100 lipid control important in reducing the progression of atherosclerotic disease. Continue statin therapy   Carotid artery stenosis Her carotid duplex demonstrates bilateral 1-39% carotid artery stenosis with no progression from her previous study. She should continue her aspirin and statin agent. I will plan to see her back in 2 years with carotid duplex this  point.    Festus Barren, MD  02/28/2017 3:20 PM    This note was created with Dragon medical transcription system.  Any errors from dictation are purely unintentional

## 2017-06-03 ENCOUNTER — Encounter: Payer: Self-pay | Admitting: Anesthesiology

## 2017-06-03 ENCOUNTER — Encounter: Payer: Self-pay | Admitting: *Deleted

## 2017-06-05 NOTE — Discharge Instructions (Signed)

## 2017-06-07 ENCOUNTER — Emergency Department: Payer: Medicare Other

## 2017-06-07 ENCOUNTER — Other Ambulatory Visit: Payer: Self-pay

## 2017-06-07 ENCOUNTER — Emergency Department
Admission: EM | Admit: 2017-06-07 | Discharge: 2017-06-07 | Disposition: A | Discharge status: Home / Self Care | Payer: Medicare Other | Attending: Emergency Medicine | Admitting: Emergency Medicine

## 2017-06-07 DIAGNOSIS — I4891 Unspecified atrial fibrillation: Secondary | ICD-10-CM | POA: Insufficient documentation

## 2017-06-07 DIAGNOSIS — I10 Essential (primary) hypertension: Secondary | ICD-10-CM | POA: Insufficient documentation

## 2017-06-07 DIAGNOSIS — R202 Paresthesia of skin: Secondary | ICD-10-CM | POA: Diagnosis not present

## 2017-06-07 DIAGNOSIS — R200 Anesthesia of skin: Secondary | ICD-10-CM | POA: Diagnosis present

## 2017-06-07 DIAGNOSIS — Z7901 Long term (current) use of anticoagulants: Secondary | ICD-10-CM | POA: Insufficient documentation

## 2017-06-07 DIAGNOSIS — E039 Hypothyroidism, unspecified: Secondary | ICD-10-CM | POA: Insufficient documentation

## 2017-06-07 LAB — DIFFERENTIAL
BASOS ABS: 0 10*3/uL (ref 0–0.1)
Basophils Relative: 1 %
Eosinophils Absolute: 0.1 10*3/uL (ref 0–0.7)
Eosinophils Relative: 1 %
LYMPHS PCT: 36 %
Lymphs Abs: 1.8 10*3/uL (ref 1.0–3.6)
Monocytes Absolute: 0.4 10*3/uL (ref 0.2–0.9)
Monocytes Relative: 8 %
NEUTROS ABS: 2.7 10*3/uL (ref 1.4–6.5)
NEUTROS PCT: 54 %

## 2017-06-07 LAB — PROTIME-INR
INR: 1.37
Prothrombin Time: 16.8 seconds — ABNORMAL HIGH (ref 11.4–15.2)

## 2017-06-07 LAB — COMPREHENSIVE METABOLIC PANEL
ALBUMIN: 4.6 g/dL (ref 3.5–5.0)
ALK PHOS: 88 U/L (ref 38–126)
ALT: 18 U/L (ref 14–54)
ANION GAP: 11 (ref 5–15)
AST: 31 U/L (ref 15–41)
BUN: 19 mg/dL (ref 6–20)
CALCIUM: 9.6 mg/dL (ref 8.9–10.3)
CO2: 25 mmol/L (ref 22–32)
CREATININE: 0.91 mg/dL (ref 0.44–1.00)
Chloride: 103 mmol/L (ref 101–111)
GFR calc Af Amer: >60 mL/min (ref >60)
GFR calc non Af Amer: 56 mL/min — ABNORMAL LOW (ref >60)
GLUCOSE: 115 mg/dL — AB (ref 65–99)
Potassium: 3.9 mmol/L (ref 3.5–5.1)
SODIUM: 139 mmol/L (ref 135–145)
Total Bilirubin: 0.9 mg/dL (ref 0.3–1.2)
Total Protein: 7.9 g/dL (ref 6.5–8.1)

## 2017-06-07 LAB — CBC
HEMATOCRIT: 43.9 % (ref 35.0–47.0)
HEMOGLOBIN: 14.2 g/dL (ref 12.0–16.0)
MCH: 28.1 pg (ref 26.0–34.0)
MCHC: 32.4 g/dL (ref 32.0–36.0)
MCV: 86.8 fL (ref 80.0–100.0)
Platelets: 193 10*3/uL (ref 150–440)
RBC: 5.06 MIL/uL (ref 3.80–5.20)
RDW: 14.2 % (ref 11.5–14.5)
WBC: 5 10*3/uL (ref 3.6–11.0)

## 2017-06-07 LAB — TROPONIN I: Troponin I: <0.03 ng/mL (ref <0.03)

## 2017-06-07 LAB — ETHANOL

## 2017-06-07 LAB — APTT: aPTT: 40 seconds — ABNORMAL HIGH (ref 24–36)

## 2017-06-07 NOTE — ED Triage Notes (Signed)
Patient to triage desk reporting that she believes she had a stroke. Patient c/o left posterior head/neck numbness. Patient has decreased sensation to left arm/leg. Patient AO X 4. Patient denies pain. No facial droop witnessed.

## 2017-06-07 NOTE — ED Notes (Signed)
ED Provider at bedside. 

## 2017-06-07 NOTE — ED Notes (Signed)
MRI screening patient at this time. 

## 2017-06-07 NOTE — ED Notes (Signed)
Patient transported to MRI 

## 2017-06-07 NOTE — ED Triage Notes (Signed)
Pt reports "having stroke", numbness at left rear head since 0100 today.  Reports left side sensation to touch different.

## 2017-06-07 NOTE — ED Provider Notes (Signed)
[facility] Emergency Department Provider Note ____________________________________________   I have reviewed the triage vital signs and the triage nursing note.  HISTORY  Chief Complaint Numbness   Historian Patient and spouse  HPI Tiffany Grimes is a 83 y.o. female from home, lives with spouse, here for evaluation of abnormal sensation "numbness" to the left side of the scalp.  Last normal around 10pm when she went to bed last night.  Awoke around 4am with numbness over left left scalp.  She's felt this before - on and off for years, since her 220s - previously told this may be related to migraines.  She decided come in for evaluation today because the area affected was slightly larger than previous episodes.  No nausea.  No vision changes.  No slurred speech.  No facial droop.  No arm or leg weakness.  No coordination problems.  The area that feels numb she states has at this point reduced in size and is mostly just a small line on the left side of her scalp now which is more consistent with prior episodes.  No recent fevers or cough or congestion or nausea or vomiting or diarrhea.  No headache with prior "migraines "and no headache now.  She did take an aspirin at home.   Past Medical History:  Diagnosis Date  . Atrial fibrillation (HCC)   . Barrett esophagus    due for EGD July 2012, [place]  . Hx of thyroid nodule July 2012   benign biopsy   . Hypertension   . Lentigo   . Reflux   . Thyroid disease   . TIA (transient ischemic attack) June 2012  . TIA (transient ischemic attack) June 2012  . Wears dentures    partial upper  . Wears hearing aid     Patient Active Problem List   Diagnosis Date Noted  . Carotid artery stenosis 02/28/2017  . Underweight 07/30/2015  . Furrowed tongue 07/30/2015  . Centrilobular emphysema (HCC) 04/18/2014  . Alpha-1-antitrypsin deficiency carrier (HCC) 04/18/2014  . Unspecified hypothyroidism 04/07/2014  .  Multiple pulmonary nodules 10/05/2013  . Pulmonary nodules/lesions, multiple 08/31/2013  . PAD (peripheral artery disease) (HCC) 08/08/2013  . External hemorrhoid 08/02/2013  . Anal fissure 08/02/2013  . Ecchymosis 05/10/2013  . Anxiety state 10/28/2012  . Routine general medical examination at a health care facility 06/15/2012  . 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 Osteoporosis, post-menopausal 02/11/2012  . Lumbago with sciatica of right side 02/11/2012  . Posterior neck pain 02/11/2012  . Hyperlipidemia LDL goal <100 12/16/2011  . Hx of thyroid nodule   . Atrial fibrillation, new onset (HCC) 08/02/2011  . Chest pain, atypical 08/02/2011  . TIA (transient ischemic attack)   . Barrett esophagus   . Hypertension 03/29/2011    Past Surgical History:  Procedure Laterality Date  . ABDOMINAL HYSTERECTOMY    . CATARACT EXTRACTION PHACO AND INTRAOCULAR LENS PLACEMENT (IOC) Right 07/29/2016   Performed by Sherald HessVin-Parikh, Anita Prakash, MD at [facility]  . CESAREAN SECTION    . HERNIA REPAIR  2008   left inguinal  . THYROID SURGERY  june 18   nodule removed    Prior to Admission medications   Medication Sig Start Date End Date Taking? Authorizing Provider  amLODipine (NORVASC) 5 MG tablet TAKE 1 TABLET BY MOUTH DAILY. 06/11/16  Yes Sherlene Shamsullo, Teresa L, MD  atenolol (TENORMIN) 25 MG tablet TAKE 1 TABLET TWICE A DAY 12/21/15  Yes Sherlene Shamsullo, Teresa L, MD  atorvastatin (LIPITOR) 20 MG tablet TAKE 1 TABLET DAILY 07/14/15  Yes Sherlene Shams, MD  esomeprazole (NEXIUM) 40 MG capsule TAKE 1 CAPSULE DAILY BEFORE BREAKFAST 04/01/16  Yes Sherlene Shams, MD  latanoprost (XALATAN) 0.005 % ophthalmic solution Place 1 drop into both eyes 2 (two) times daily.     Yes [provider]  levothyroxine (SYNTHROID, LEVOTHROID) 75 MCG tablet Take 75 mcg daily before breakfast by mouth.   Yes [provider]  XARELTO 15 MG TABS tablet TAKE 1 TABLET DAILY 11/06/15  Yes Sherlene Shams,  MD  escitalopram (LEXAPRO) 10 MG tablet TAKE 1 TABLET BY MOUTH DAILY. Patient not taking: Reported on 06/07/2017 03/14/15   Sherlene Shams, MD  fluticasone [facility]) 50 MCG/ACT nasal spray Place 2 sprays into both nostrils daily as needed. 2 sprays into each nostril daily 07/28/14   Sherlene Shams, MD  glucose blood test strip Use as instructed Patient not taking: Reported on 07/29/2016 08/28/15   Sherlene Shams, MD  lisinopril (PRINIVIL,ZESTRIL) 10 MG tablet Take 1 tablet (10 mg total) by mouth 2 (two) times daily. NEED TO SCHEDULE OFFICE VISIT WITH PCP 06/03/16   Sherlene Shams, MD  sucralfate (CARAFATE) 1 g tablet Take 1 tablet (1 g total) by mouth 2 (two) times daily. Patient not taking: Reported on 06/07/2017 08/28/15   Sherlene Shams, MD    Allergies  Allergen Reactions  . Neomycin Itching    Family History  Problem Relation Age of Onset  . Hypertension Mother   . Stroke Mother   . Heart disease Father 56       AMI  . Hypertension Father   . Vision loss Paternal Aunt   . Cancer Neg Hx     Social History Social History   Tobacco Use  . Smoking status: Never Smoker  . Smokeless tobacco: Never Used  Substance Use Topics  . Alcohol use: No  . Drug use: No    Review of Systems  Constitutional: Negative for fever. Eyes: Negative for visual changes. ENT: Negative for sore throat. Cardiovascular: Negative for chest pain. Respiratory: Negative for shortness of breath. Gastrointestinal: Negative for abdominal pain, vomiting and diarrhea. Genitourinary: Negative for dysuria. Musculoskeletal: Negative for back pain. Skin: Negative for rash. Neurological: Negative for headache.  ____________________________________________   PHYSICAL EXAM:  VITAL SIGNS: ED Triage Vitals  Enc Vitals Group     BP 06/07/17 0707 (!) 186/103     Pulse Rate 06/07/17 0707 89     Resp 06/07/17 0707 17     Temp 06/07/17 0707 97.6 F (36.4 C)     Temp Source 06/07/17 0707 Oral     SpO2  06/07/17 0707 95 %     Weight 06/07/17 0706 120 lb 6.4 oz (54.6 kg)     Height --      Head Circumference --      Peak Flow --      Pain Score 06/07/17 0705 0     Pain Loc --      Pain Edu? --      Excl. in GC? --      Constitutional: Alert and oriented. Well appearing and in no distress. HEENT   Head: Normocephalic and atraumatic.      Eyes: Conjunctivae are normal. Pupils equal and round.       Ears:         Nose: No congestion/rhinnorhea.   Mouth/Throat: Mucous membranes are moist.   Neck: No stridor.  Cardiovascular/Chest: Normal rate, regular rhythm.  No murmurs, rubs, or gallops. Respiratory: Normal respiratory effort without tachypnea nor retractions. Breath sounds are clear and equal bilaterally. No wheezes/rales/rhonchi. Gastrointestinal: Soft. No distention, no guarding, no rebound. Nontender.    Genitourinary/rectal:Deferred Musculoskeletal: Nontender with normal range of motion in all extremities. No joint effusions.  No lower extremity tenderness.  No edema. Neurologic:  No facial droop.  Normal speech and language. 5/5 strength in 4 extremities.  Normal sensation facial nerve, upper and lower extremities.  Coordinatin intact.  Paraesthesia left scalp patch  Skin:  Skin is warm, dry and intact. No rash noted. Psychiatric: Mood and affect are normal. Speech and behavior are normal. Patient exhibits appropriate insight and judgment.   ____________________________________________  LABS (pertinent positives/negatives) I, Governor Rooks, MD the attending physician have reviewed the labs noted below.  Labs Reviewed  PROTIME-INR - Abnormal; Notable for the following components:      Result Value   Prothrombin Time 16.8 (*)    All other components within normal limits  APTT - Abnormal; Notable for the following components:   aPTT 40 (*)    All other components within normal limits  COMPREHENSIVE METABOLIC PANEL - Abnormal; Notable for the following components:    Glucose, Bld 115 (*)    GFR calc non Af Amer 56 (*)    All other components within normal limits  ETHANOL  CBC  DIFFERENTIAL  TROPONIN I  URINE DRUG SCREEN, QUALITATIVE ([facility] ONLY)  URINALYSIS, ROUTINE W REFLEX MICROSCOPIC    ____________________________________________    EKG I, Governor Rooks, MD, the attending physician have personally viewed and interpreted all ECGs.  101 bpm.  Atrial fibrillation.  Narrow QS with normal axis.  Nonspecific ST and T wave ____________________________________________  RADIOLOGY All Xrays were viewed by me.  Imaging interpreted by Radiologist, and I, Governor Rooks, MD the attending physician have reviewed the radiologist interpretation noted below.  CT head without contrast:   IMPRESSION: 1. No acute intracranial abnormality or significant interval change. 2. Stable atrophy and white matter disease, likely within normal limits for age. 3. Intracranial atherosclerosis without a hyperdense vessel. 4. ASPECTS is 10/10  MRI brain without: IMPRESSION: 1. No acute or subacute infarct. 2. Normal MRI the brain for age. 3. Bilateral mastoid effusions are present. No obstructing nasopharyngeal lesion is evident. __________________________________________  PROCEDURES  Procedure(s) performed: None  Critical Care performed: None   ____________________________________________  ED COURSE / ASSESSMENT AND PLAN  Pertinent labs & imaging results that were available during my care of the patient were reviewed by me and considered in my medical decision making (see chart for details).   Patient sent for CT head by protocol for neuro deficit complaint. CT head negative.   On my history and exam, patient is not a tPA candidate due to last known normal 10pm also minor and improving symptoms.  I am not really convinced this an ischemic episode given she's had similar symptoms since her 56s -- I agree with prior evals that this may be some type/variant of  a migraine.  However, given her age, will obtain MRI to ensure no focal findings.  Think it is really unlikely that this would be CVA/TIA related given that she is already on Xarelto as well.  DIFFERENTIAL DIAGNOSIS: Including but not limited to CVA, tia, neuropathy, contusion, migraine, electrolyte disturbance, etc.  CONSULTATIONS:   None   Patient / Family / Caregiver informed of clinical course, medical decision-making process, and agree with plan.  I discussed return precautions, follow-up instructions, and discharge instructions with patient and/or family.  Discharge Instructions : You were evaluated for unusual sensation (numbness) that is resolving to the left scalp and although no certain cause was found, your exam and evaluation are overall reassuring in the emergency department today.  Return to the emerge department immediately for any new or worsening condition including any one-sided weakness, new or different area of numbness, slurred speech, trouble finding words, skin rash, confusion altered mental status, off balance, or any other symptoms concerning to you.    ___________________________________________   FINAL CLINICAL IMPRESSION(S) / ED DIAGNOSES   Final diagnoses:  Paresthesia      ___________________________________________  ED Discharge Orders    None            Note: This dictation was prepared with Dragon dictation. Any transcriptional errors that result from this process are unintentional    Governor Rooks, MD 06/07/17 1034

## 2017-06-07 NOTE — Discharge Instructions (Signed)
You were evaluated for unusual sensation (numbness) that is resolving to the left scalp and although no certain cause was found, your exam and evaluation are overall reassuring in the emergency department today.  Return to the emerge department immediately for any new or worsening condition including any one-sided weakness, new or different area of numbness, slurred speech, trouble finding words, skin rash, confusion altered mental status, off balance, or any other symptoms concerning to you.

## 2017-06-09 ENCOUNTER — Ambulatory Visit
Admission: RE | Admit: 2017-06-09 | Discharge: 2017-06-09 | Disposition: A | Discharge status: Home / Self Care | Payer: Medicare Other | Source: Ambulatory Visit | Attending: Ophthalmology | Admitting: Ophthalmology

## 2017-06-09 ENCOUNTER — Encounter
Admission: RE | Disposition: A | Discharge status: Home / Self Care | Payer: Self-pay | Source: Ambulatory Visit | Attending: Ophthalmology

## 2017-06-09 DIAGNOSIS — H2512 Age-related nuclear cataract, left eye: Secondary | ICD-10-CM | POA: Diagnosis present

## 2017-06-09 DIAGNOSIS — Z538 Procedure and treatment not carried out for other reasons: Secondary | ICD-10-CM | POA: Insufficient documentation

## 2017-06-09 SURGERY — PHACOEMULSIFICATION, CATARACT, WITH IOL INSERTION
Anesthesia: Topical | Laterality: Left

## 2017-06-09 SURGICAL SUPPLY — 15 items
CANNULA ANT/CHMB 27GA (MISCELLANEOUS) ×3 IMPLANT
DISSECTOR HYDRO NUCLEUS 50X22 (MISCELLANEOUS) ×3 IMPLANT
GLOVE BIO SURGEON STRL SZ8 (GLOVE) ×3 IMPLANT
GLOVE SURG LX 7.5 STRW (GLOVE) ×2
GLOVE SURG LX STRL 7.5 STRW (GLOVE) ×1 IMPLANT
GOWN STRL REUS W/ TWL LRG LVL3 (GOWN DISPOSABLE) ×2 IMPLANT
GOWN STRL REUS W/TWL LRG LVL3 (GOWN DISPOSABLE) ×4
MARKER SKIN DUAL TIP RULER LAB (MISCELLANEOUS) ×3 IMPLANT
PACK CATARACT (MISCELLANEOUS) ×3 IMPLANT
PACK DR. KING ARMS (PACKS) ×3 IMPLANT
PACK EYE AFTER SURG (MISCELLANEOUS) ×3 IMPLANT
SYR 3ML LL SCALE MARK (SYRINGE) ×3 IMPLANT
SYR TB 1ML LUER SLIP (SYRINGE) ×3 IMPLANT
WATER STERILE IRR 500ML POUR (IV SOLUTION) ×3 IMPLANT
WIPE NON LINTING 3.25X3.25 (MISCELLANEOUS) ×3 IMPLANT

## 2017-06-09 NOTE — H&P (Signed)
The History and Physical notes are on paper, have been signed, and are to be scanned.   I have examined the patient and there are no changes to the H&P.   Willey BladeBradley Vale Mousseau 06/09/2017 10:22 AM

## 2017-06-09 NOTE — H&P (Signed)
The patient ate breakfast and was canceled, to be rescheduled.  I did not examine the patient.   Tiffany BladeBradley Grimes 06/09/2017 10:26 AM

## 2017-06-09 NOTE — Progress Notes (Signed)
Patient canceled per anesthesia, patient ate this morning

## 2017-07-01 ENCOUNTER — Encounter: Payer: Self-pay | Admitting: *Deleted

## 2017-07-02 ENCOUNTER — Encounter
Admission: RE | Disposition: A | Discharge status: Home / Self Care | Payer: Self-pay | Source: Ambulatory Visit | Attending: Ophthalmology

## 2017-07-02 ENCOUNTER — Encounter: Payer: Self-pay | Admitting: *Deleted

## 2017-07-02 ENCOUNTER — Ambulatory Visit: Payer: Medicare Other | Admitting: Anesthesiology

## 2017-07-02 ENCOUNTER — Other Ambulatory Visit: Payer: Self-pay

## 2017-07-02 ENCOUNTER — Ambulatory Visit
Admission: RE | Admit: 2017-07-02 | Discharge: 2017-07-02 | Disposition: A | Discharge status: Home / Self Care | Payer: Medicare Other | Source: Ambulatory Visit | Attending: Ophthalmology | Admitting: Ophthalmology

## 2017-07-02 DIAGNOSIS — H2512 Age-related nuclear cataract, left eye: Secondary | ICD-10-CM | POA: Diagnosis not present

## 2017-07-02 DIAGNOSIS — I4891 Unspecified atrial fibrillation: Secondary | ICD-10-CM | POA: Diagnosis not present

## 2017-07-02 DIAGNOSIS — H9193 Unspecified hearing loss, bilateral: Secondary | ICD-10-CM | POA: Diagnosis not present

## 2017-07-02 DIAGNOSIS — K219 Gastro-esophageal reflux disease without esophagitis: Secondary | ICD-10-CM | POA: Diagnosis not present

## 2017-07-02 DIAGNOSIS — E039 Hypothyroidism, unspecified: Secondary | ICD-10-CM | POA: Insufficient documentation

## 2017-07-02 DIAGNOSIS — E7800 Pure hypercholesterolemia, unspecified: Secondary | ICD-10-CM | POA: Diagnosis not present

## 2017-07-02 DIAGNOSIS — Z90710 Acquired absence of both cervix and uterus: Secondary | ICD-10-CM | POA: Diagnosis not present

## 2017-07-02 DIAGNOSIS — F419 Anxiety disorder, unspecified: Secondary | ICD-10-CM | POA: Insufficient documentation

## 2017-07-02 DIAGNOSIS — I10 Essential (primary) hypertension: Secondary | ICD-10-CM | POA: Diagnosis not present

## 2017-07-02 DIAGNOSIS — Z881 Allergy status to other antibiotic agents status: Secondary | ICD-10-CM | POA: Insufficient documentation

## 2017-07-02 DIAGNOSIS — Z8673 Personal history of transient ischemic attack (TIA), and cerebral infarction without residual deficits: Secondary | ICD-10-CM | POA: Insufficient documentation

## 2017-07-02 DIAGNOSIS — I739 Peripheral vascular disease, unspecified: Secondary | ICD-10-CM | POA: Diagnosis not present

## 2017-07-02 HISTORY — DX: Cardiac arrhythmia, unspecified: I49.9

## 2017-07-02 HISTORY — DX: Hypothyroidism, unspecified: E03.9

## 2017-07-02 HISTORY — PX: CATARACT EXTRACTION W/PHACO: SHX586

## 2017-07-02 SURGERY — PHACOEMULSIFICATION, CATARACT, WITH IOL INSERTION
Anesthesia: Monitor Anesthesia Care | Site: Eye | Laterality: Left | Wound class: Clean

## 2017-07-02 MED ORDER — LIDOCAINE HCL (PF) 4 % IJ SOLN
INTRAMUSCULAR | Status: DC | PRN
  Administered 2017-07-02: 4 mL via OPHTHALMIC

## 2017-07-02 MED ORDER — MIDAZOLAM HCL 2 MG/2ML IJ SOLN
INTRAMUSCULAR | Status: DC | PRN
  Administered 2017-07-02: 0.5 mg via INTRAVENOUS

## 2017-07-02 MED ORDER — MOXIFLOXACIN HCL 0.5 % OP SOLN: 1.0000 [drp] | OPHTHALMIC | Status: DC | PRN

## 2017-07-02 MED ORDER — FENTANYL CITRATE (PF) 100 MCG/2ML IJ SOLN
INTRAMUSCULAR | Status: AC
  Filled 2017-07-02: qty 2

## 2017-07-02 MED ORDER — ARMC OPHTHALMIC DILATING DROPS
1.0000 "application " | OPHTHALMIC | Status: AC
  Administered 2017-07-02 (×3): 1 via OPHTHALMIC

## 2017-07-02 MED ORDER — EPINEPHRINE PF 1 MG/ML IJ SOLN
INTRAOCULAR | Status: DC | PRN
  Administered 2017-07-02: 08:00:00 via OPHTHALMIC

## 2017-07-02 MED ORDER — MIDAZOLAM HCL 2 MG/2ML IJ SOLN
INTRAMUSCULAR | Status: AC
  Filled 2017-07-02: qty 2

## 2017-07-02 MED ORDER — MOXIFLOXACIN HCL 0.5 % OP SOLN
OPHTHALMIC | Status: DC | PRN
  Administered 2017-07-02: 0.2 mL via OPHTHALMIC

## 2017-07-02 MED ORDER — SODIUM HYALURONATE 23 MG/ML IO SOLN
INTRAOCULAR | Status: AC
  Filled 2017-07-02: qty 0.6

## 2017-07-02 MED ORDER — SODIUM CHLORIDE 0.9 % IV SOLN
INTRAVENOUS | Status: DC
  Administered 2017-07-02: 07:00:00 via INTRAVENOUS

## 2017-07-02 MED ORDER — MOXIFLOXACIN HCL 0.5 % OP SOLN
OPHTHALMIC | Status: AC
  Filled 2017-07-02: qty 3

## 2017-07-02 MED ORDER — EPINEPHRINE PF 1 MG/ML IJ SOLN
INTRAMUSCULAR | Status: AC
  Filled 2017-07-02: qty 1

## 2017-07-02 MED ORDER — ARMC OPHTHALMIC DILATING DROPS
OPHTHALMIC | Status: AC
  Administered 2017-07-02: 08:00:00
  Filled 2017-07-02: qty 0.4

## 2017-07-02 MED ORDER — CARBACHOL 0.01 % IO SOLN
INTRAOCULAR | Status: DC | PRN
  Administered 2017-07-02: 0.5 mL via INTRAOCULAR

## 2017-07-02 MED ORDER — POVIDONE-IODINE 5 % OP SOLN
OPHTHALMIC | Status: DC | PRN
  Administered 2017-07-02: 1 via OPHTHALMIC

## 2017-07-02 MED ORDER — SODIUM HYALURONATE 10 MG/ML IO SOLN
INTRAOCULAR | Status: DC | PRN
  Administered 2017-07-02: 0.55 mL via INTRAOCULAR

## 2017-07-02 MED ORDER — POVIDONE-IODINE 5 % OP SOLN
OPHTHALMIC | Status: AC
  Filled 2017-07-02: qty 30

## 2017-07-02 MED ORDER — SODIUM HYALURONATE 23 MG/ML IO SOLN
INTRAOCULAR | Status: DC | PRN
  Administered 2017-07-02: 0.6 mL via INTRAOCULAR

## 2017-07-02 MED ORDER — LIDOCAINE HCL (PF) 4 % IJ SOLN
INTRAMUSCULAR | Status: AC
  Filled 2017-07-02: qty 5

## 2017-07-02 SURGICAL SUPPLY — 17 items
DISSECTOR HYDRO NUCLEUS 50X22 (MISCELLANEOUS) ×3 IMPLANT
GLOVE BIO SURGEON STRL SZ8 (GLOVE) ×3 IMPLANT
GLOVE BIOGEL M 6.5 STRL (GLOVE) ×3 IMPLANT
GLOVE SURG LX 7.5 STRW (GLOVE) ×2
GLOVE SURG LX STRL 7.5 STRW (GLOVE) ×1 IMPLANT
GOWN STRL REUS W/ TWL LRG LVL3 (GOWN DISPOSABLE) ×2 IMPLANT
GOWN STRL REUS W/TWL LRG LVL3 (GOWN DISPOSABLE) ×4
LABEL CATARACT MEDS ST (LABEL) ×3 IMPLANT
LENS IOL ACRSF IQ ULTRA 19.0 (Intraocular Lens) ×1 IMPLANT
LENS IOL ACRYSOF IQ 19.0 (Intraocular Lens) ×3 IMPLANT
PACK CATARACT (MISCELLANEOUS) ×3 IMPLANT
PACK CATARACT KING (MISCELLANEOUS) ×3 IMPLANT
PACK EYE AFTER SURG (MISCELLANEOUS) ×3 IMPLANT
SOL BSS BAG (MISCELLANEOUS) ×3
SOLUTION BSS BAG (MISCELLANEOUS) ×1 IMPLANT
WATER STERILE IRR 250ML POUR (IV SOLUTION) ×3 IMPLANT
WIPE NON LINTING 3.25X3.25 (MISCELLANEOUS) ×3 IMPLANT

## 2017-07-02 NOTE — H&P (Signed)
The History and Physical notes are on paper, have been signed, and are to be scanned.   I have examined the patient and there are no changes to the H&P.   Willey Blade 07/02/2017 8:20 AM

## 2017-07-02 NOTE — Anesthesia Post-op Follow-up Note (Deleted)
Anesthesia QCDR form completed.        

## 2017-07-02 NOTE — Discharge Instructions (Signed)
FOLLOW DR. Elmer BalesKING'S POSTOP EYE DROP INSTRUCTION SHEET AS REVIEWED.\   Eye Surgery Discharge Instructions  Expect mild scratchy sensation or mild soreness. DO NOT RUB YOUR EYE!  The day of surgery:  Minimal physical activity, but bed rest is not required  No reading, computer work, or close hand work  No bending, lifting, or straining.  May watch TV  For 24 hours:  No driving, legal decisions, or alcoholic beverages  Safety precautions  Eat anything you prefer: It is better to start with liquids, then soup then solid foods.  _____ Eye patch should be worn until postoperative exam tomorrow.  ____ Solar shield eyeglasses should be worn for comfort in the sunlight/patch while sleeping  Resume all regular medications including aspirin or Coumadin if these were discontinued prior to surgery. You may shower, bathe, shave, or wash your hair. Tylenol may be taken for mild discomfort.  Call your doctor if you experience significant pain, nausea, or vomiting, fever > 101 or other signs of infection. [phone] or [phone] Specific instructions:  Follow-up Information    [place] CraneKing, Bradley Mark, MD Follow up.   Specialty:  Ophthalmology Why:  07/03/17 @ 1:45 pm IN THE [place] OFFICE. Contact information: [address] [place] [address] [phone]

## 2017-07-02 NOTE — Anesthesia Postprocedure Evaluation (Signed)
Anesthesia Post Note  Patient: Humphrey Rolls  Procedure(s) Performed: CATARACT EXTRACTION PHACO AND INTRAOCULAR LENS PLACEMENT (IOC) (Left Eye)  Patient location during evaluation: PACU Anesthesia Type: MAC Level of consciousness: awake and alert Pain management: pain level controlled Vital Signs Assessment: post-procedure vital signs reviewed and stable Respiratory status: spontaneous breathing, nonlabored ventilation, respiratory function stable and patient connected to nasal cannula oxygen Cardiovascular status: stable and blood pressure returned to baseline Postop Assessment: no apparent nausea or vomiting Anesthetic complications: no     Last Vitals:  Vitals:   07/02/17 0653 07/02/17 0858  BP: (!) 157/76 (!) 156/62  Pulse: (!) 58 60  Resp: 14 16  Temp: (!) 36.1 C 36.7 C  SpO2: 97% 98%    Last Pain:  Vitals:   07/02/17 0858  TempSrc: Oral                 Precious Haws Dusty Wagoner

## 2017-07-02 NOTE — Anesthesia Preprocedure Evaluation (Signed)
Anesthesia Evaluation  Patient identified by MRN, date of birth, ID band Patient awake    Reviewed: Allergy & Precautions, H&P , NPO status , Patient's Chart, lab work & pertinent test results, reviewed documented beta blocker date and time   Airway Mallampati: II  TM Distance: >3 FB Neck ROM: full    Dental  (+) Partial Upper   Pulmonary COPD,    Pulmonary exam normal breath sounds clear to auscultation       Cardiovascular Exercise Tolerance: Good hypertension, + Peripheral Vascular Disease  + dysrhythmias  Rhythm:regular Rate:Normal     Neuro/Psych PSYCHIATRIC DISORDERS Anxiety TIA Neuromuscular disease negative psych ROS   GI/Hepatic Neg liver ROS, GERD  ,  Endo/Other  Hypothyroidism   Renal/GU negative Renal ROS  negative genitourinary   Musculoskeletal   Abdominal   Peds  Hematology negative hematology ROS (+)   Anesthesia Other Findings Past Medical History: No date: Atrial fibrillation ([place]) No date: Barrett esophagus     Comment:  due for EGD July 2012, [place] No date: Dysrhythmia July 2012: Hx of thyroid nodule     Comment:  benign biopsy  No date: Hypertension No date: Hypothyroidism No date: Lentigo No date: Reflux No date: Thyroid disease June 2012: TIA (transient ischemic attack) June 2012: TIA (transient ischemic attack) No date: Wears dentures     Comment:  partial upper No date: Wears hearing aid   Reproductive/Obstetrics negative OB ROS                             Anesthesia Physical  Anesthesia Plan  ASA: III  Anesthesia Plan: MAC   Post-op Pain Management:    Induction: Intravenous  PONV Risk Score and Plan:   Airway Management Planned: Natural Airway and Nasal Cannula  Additional Equipment:   Intra-op Plan:   Post-operative Plan:   Informed Consent: I have reviewed the patients History and Physical, chart, labs and discussed the procedure  including the risks, benefits and alternatives for the proposed anesthesia with the patient or authorized representative who has indicated his/her understanding and acceptance.   Dental Advisory Given  Plan Discussed with: CRNA  Anesthesia Plan Comments: (Patient consented for risks of anesthesia including but not limited to:  - adverse reactions to medications - damage to teeth, lips or other oral mucosa - sore throat or hoarseness - Damage to heart, brain, lungs or loss of life  Patient voiced understanding.)        Anesthesia Quick Evaluation

## 2017-07-02 NOTE — Transfer of Care (Signed)
Immediate Anesthesia Transfer of Care Note  Patient: Tiffany Grimes  Procedure(s) Performed: CATARACT EXTRACTION PHACO AND INTRAOCULAR LENS PLACEMENT (IOC) (Left Eye)  Patient Location: PACU and Short Stay  Anesthesia Type:MAC  Level of Consciousness: awake and patient cooperative  Airway & Oxygen Therapy: Patient Spontanous Breathing  Post-op Assessment: Report given to RN and Post -op Vital signs reviewed and stable  Post vital signs: Reviewed and stable  Last Vitals:  Vitals:   07/02/17 0653  BP: (!) 157/76  Pulse: (!) 58  Resp: 14  Temp: (!) 36.1 C  SpO2: 97%    Last Pain:  Vitals:   07/02/17 0653  TempSrc: Oral         Complications: No apparent anesthesia complications

## 2017-07-02 NOTE — Op Note (Signed)
OPERATIVE NOTE  Tiffany Grimes [id number] 07/02/2017   PREOPERATIVE DIAGNOSIS:  Nuclear sclerotic cataract left eye.  H25.12   POSTOPERATIVE DIAGNOSIS:    Nuclear sclerotic cataract left eye.     PROCEDURE:  Phacoemusification with posterior chamber intraocular lens placement of the left eye   LENS:   Implant Name Type Inv. Item Serial No. Manufacturer Lot No. LRB No. Used  LENS IOL ACRYSOF IQ 19.0 - [id number] Intraocular Lens LENS IOL ACRYSOF IQ 19.0 [id number] ALCON  Left 1       AU00T0 19.0   ULTRASOUND TIME: 0 minutes 50.9 seconds.  CDE 7.54   SURGEON:  Willey Blade, MD, MPH   ANESTHESIA:  Topical with tetracaine drops augmented with 1% preservative-free intracameral lidocaine.  ESTIMATED BLOOD LOSS: <1 mL   COMPLICATIONS:  None.   DESCRIPTION OF PROCEDURE:  The patient was identified in the holding room and transported to the operating room and placed in the supine position under the operating microscope.  The left eye was identified as the operative eye and it was prepped and draped in the usual sterile ophthalmic fashion.   A 1.0 millimeter clear-corneal paracentesis was made at the 5:00 position. 0.5 ml of preservative-free 1% lidocaine with epinephrine was injected into the anterior chamber.  The anterior chamber was filled with Healon 5 viscoelastic.  A 2.4 millimeter keratome was used to make a near-clear corneal incision at the 2:00 position.  A curvilinear capsulorrhexis was made with a cystotome and capsulorrhexis forceps.  Balanced salt solution was used to hydrodissect and hydrodelineate the nucleus.   Phacoemulsification was then used in stop and chop fashion to remove the lens nucleus and epinucleus.  The remaining cortex was then removed using the irrigation and aspiration handpiece. Healon was then placed into the capsular bag to distend it for lens placement.  A lens was then injected into the capsular bag.  The remaining viscoelastic was aspirated.    Wounds were hydrated with balanced salt solution.  The anterior chamber was inflated to a physiologic pressure with balanced salt solution.  Intracameral vigamox 0.1 mL undiltued was injected into the eye and a drop placed onto the ocular surface.  No wound leaks were noted.  The patient was taken to the recovery room in stable condition without complications of anesthesia or surgery  Willey Blade 07/02/2017, 8:54 AM

## 2017-07-02 NOTE — Anesthesia Post-op Follow-up Note (Signed)
Anesthesia QCDR form completed.        

## 2017-07-03 ENCOUNTER — Encounter: Payer: Self-pay | Admitting: Ophthalmology

## 2017-11-19 ENCOUNTER — Emergency Department: Payer: Medicare Other

## 2017-11-19 ENCOUNTER — Other Ambulatory Visit: Payer: Self-pay

## 2017-11-19 ENCOUNTER — Encounter: Payer: Self-pay | Admitting: Emergency Medicine

## 2017-11-19 ENCOUNTER — Emergency Department
Admission: EM | Admit: 2017-11-19 | Discharge: 2017-11-20 | Disposition: A | Discharge status: Home / Self Care | Payer: Medicare Other | Attending: Emergency Medicine | Admitting: Emergency Medicine

## 2017-11-19 DIAGNOSIS — R002 Palpitations: Secondary | ICD-10-CM | POA: Diagnosis present

## 2017-11-19 DIAGNOSIS — Z8673 Personal history of transient ischemic attack (TIA), and cerebral infarction without residual deficits: Secondary | ICD-10-CM | POA: Diagnosis not present

## 2017-11-19 DIAGNOSIS — R5381 Other malaise: Secondary | ICD-10-CM

## 2017-11-19 DIAGNOSIS — I10 Essential (primary) hypertension: Secondary | ICD-10-CM | POA: Insufficient documentation

## 2017-11-19 DIAGNOSIS — Z79899 Other long term (current) drug therapy: Secondary | ICD-10-CM | POA: Insufficient documentation

## 2017-11-19 DIAGNOSIS — E039 Hypothyroidism, unspecified: Secondary | ICD-10-CM | POA: Diagnosis not present

## 2017-11-19 MED ORDER — MAGNESIUM SULFATE 2 GM/50ML IV SOLN
2.0000 g | Freq: Once | INTRAVENOUS | Status: AC
Start: 2017-11-19 — End: 2017-11-20
  Administered 2017-11-20: 2 g via INTRAVENOUS
  Filled 2017-11-19: qty 50

## 2017-11-19 MED ORDER — SODIUM CHLORIDE 0.9 % IV BOLUS
500.0000 mL | Freq: Once | INTRAVENOUS | Status: AC
  Administered 2017-11-19: 500 mL via INTRAVENOUS

## 2017-11-19 MED ORDER — CLONIDINE HCL 0.1 MG PO TABS
0.1000 mg | ORAL_TABLET | Freq: Once | ORAL | Status: AC
  Administered 2017-11-19: 0.1 mg via ORAL
  Filled 2017-11-19: qty 1

## 2017-11-19 NOTE — ED Provider Notes (Signed)
[facility] Emergency Department Provider Note  ____________________________________________   First MD Initiated Contact with Patient 11/19/17 2326     (approximate)  I have reviewed the triage vital signs and the nursing notes.   HISTORY  Chief Complaint Hypertension  HPI Tiffany Grimes is a 84 y.o. female who comes to the emergency department via EMS with a brief episode of malaise and "palpitations" that happened at home today.  She said that she "misbehaved" by overexerting herself out in the yard today because the weather was so beautiful.  She denies chest pain or shortness of breath.  She denies leg swelling.  She is concerned that her blood pressure might have been high today and she wanted it checked.  Past Medical History:  Diagnosis Date  . Atrial fibrillation (HCC)   . Barrett esophagus    due for EGD July 2012, [place]  . Dysrhythmia   . Hx of thyroid nodule July 2012   benign biopsy   . Hypertension   . Hypothyroidism   . Lentigo   . Reflux   . Thyroid disease   . TIA (transient ischemic attack) June 2012  . TIA (transient ischemic attack) June 2012  . Wears dentures    partial upper  . Wears hearing aid     Patient Active Problem List   Diagnosis Date Noted  . Carotid artery stenosis 02/28/2017  . Underweight 07/30/2015  . Furrowed tongue 07/30/2015  . Centrilobular emphysema (HCC) 04/18/2014  . Alpha-1-antitrypsin deficiency carrier (HCC) 04/18/2014  . Unspecified hypothyroidism 04/07/2014  . Multiple pulmonary nodules 10/05/2013  . Pulmonary nodules/lesions, multiple 08/31/2013  . PAD (peripheral artery disease) (HCC) 08/08/2013  . External hemorrhoid 08/02/2013  . Anal fissure 08/02/2013  . Ecchymosis 05/10/2013  . Anxiety state 10/28/2012  . Routine general medical examination at a health care facility 06/15/2012  . 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 Osteoporosis, post-menopausal 02/11/2012  . Lumbago  with sciatica of right side 02/11/2012  . Posterior neck pain 02/11/2012  . Hyperlipidemia LDL goal <100 12/16/2011  . Hx of thyroid nodule   . Atrial fibrillation, new onset (HCC) 08/02/2011  . Chest pain, atypical 08/02/2011  . TIA (transient ischemic attack)   . Barrett esophagus   . Hypertension 03/29/2011    Past Surgical History:  Procedure Laterality Date  . ABDOMINAL HYSTERECTOMY    . CATARACT EXTRACTION W/PHACO Right 07/29/2016   Procedure: CATARACT EXTRACTION PHACO AND INTRAOCULAR LENS PLACEMENT (IOC);  Surgeon: Sherald Hess, MD;  Location: [facility];  Service: Ophthalmology;  Laterality: Right;  right  . CATARACT EXTRACTION W/PHACO Left 07/02/2017   Procedure: CATARACT EXTRACTION PHACO AND INTRAOCULAR LENS PLACEMENT (IOC);  Surgeon: Nevada Crane, MD;  Location: [facility];  Service: Ophthalmology;  Laterality: Left;  [place] 00:50.9 AP% 14.8 CDE 7.54 Fluid Pack Lot # [id number]  . CESAREAN SECTION    . HERNIA REPAIR  2008   left inguinal  . THYROID SURGERY  june 18   nodule removed    Prior to Admission medications   Medication Sig Start Date End Date Taking? Authorizing Provider  amLODipine (NORVASC) 5 MG tablet TAKE 1 TABLET BY MOUTH DAILY. 06/11/16   Sherlene Shams, MD  atenolol (TENORMIN) 25 MG tablet TAKE 1 TABLET TWICE A DAY 12/21/15   Sherlene Shams, MD  atorvastatin (LIPITOR) 20 MG tablet TAKE 1 TABLET DAILY 07/14/15   Sherlene Shams, MD  escitalopram (LEXAPRO) 10 MG tablet TAKE 1 TABLET BY  MOUTH DAILY. Patient not taking: Reported on 06/27/2017 03/14/15   Sherlene Shams, MD  esomeprazole (NEXIUM) 40 MG capsule TAKE 1 CAPSULE DAILY BEFORE BREAKFAST 04/01/16   Sherlene Shams, MD  fluticasone (FLONASE) 50 MCG/ACT nasal spray Place 2 sprays into both nostrils daily as needed. 2 sprays into each nostril daily 07/28/14   Sherlene Shams, MD  glucose blood test strip Use as instructed Patient not taking: Reported on 07/29/2016 08/28/15   Sherlene Shams,  MD  latanoprost (XALATAN) 0.005 % ophthalmic solution Place 1 drop into both eyes at bedtime.     [provider]  levothyroxine (SYNTHROID, LEVOTHROID) 75 MCG tablet Take 75 mcg daily before breakfast by mouth.    [provider]  lisinopril (PRINIVIL,ZESTRIL) 10 MG tablet Take 1 tablet (10 mg total) by mouth 2 (two) times daily. NEED TO SCHEDULE OFFICE VISIT WITH PCP 06/03/16   Sherlene Shams, MD  sucralfate (CARAFATE) 1 g tablet Take 1 tablet (1 g total) by mouth 2 (two) times daily. Patient taking differently: Take 2 g by mouth daily.  08/28/15   Sherlene Shams, MD  XARELTO 15 MG TABS tablet TAKE 1 TABLET DAILY 11/06/15   Sherlene Shams, MD    Allergies Neomycin  Family History  Problem Relation Age of Onset  . Hypertension Mother   . Stroke Mother   . Heart disease Father 64       AMI  . Hypertension Father   . Vision loss Paternal Aunt   . Cancer Neg Hx     Social History Social History   Tobacco Use  . Smoking status: Never Smoker  . Smokeless tobacco: Never Used  Substance Use Topics  . Alcohol use: No  . Drug use: No    Review of Systems Constitutional: No fever/chills Eyes: No visual changes. ENT: No sore throat. Cardiovascular: Denies chest pain. Respiratory: Denies shortness of breath. Gastrointestinal: No abdominal pain.  No nausea, no vomiting.  No diarrhea.  No constipation. Genitourinary: Negative for dysuria. Musculoskeletal: Negative for back pain. Skin: Negative for rash. Neurological: Negative for headaches, focal weakness or numbness.   ____________________________________________   PHYSICAL EXAM:  VITAL SIGNS: ED Triage Vitals  Enc Vitals Group     BP 11/19/17 2322 (!) 199/102     Pulse Rate 11/19/17 2322 (!) 102     Resp 11/19/17 2322 18     Temp 11/19/17 2322 97.6 F (36.4 C)     Temp Source 11/19/17 2322 Oral     SpO2 11/19/17 2322 97 %     Weight 11/19/17 2323 115 lb (52.2 kg)     Height 11/19/17 2323 5\' 7"   (1.702 m)     Head Circumference --      Peak Flow --      Pain Score 11/19/17 2322 0     Pain Loc --      Pain Edu? --      Excl. in GC? --     Constitutional: Alert and oriented x4 pleasant cooperative speaks full clear sentences no diaphoresis Eyes: PERRL EOMI. Head: Atraumatic. Nose: No congestion/rhinnorhea. Mouth/Throat: No trismus Neck: No stridor.   Cardiovascular: Irregularly irregular Respiratory: Normal respiratory effort.  No retractions. Lungs CTAB and moving good air Gastrointestinal: Soft nontender Musculoskeletal: No lower extremity edema   Neurologic:  Normal speech and language. No gross focal neurologic deficits are appreciated. Skin:  Skin is warm, dry and intact. No rash noted. Psychiatric: Mood and affect are normal.  Speech and behavior are normal.    ____________________________________________   DIFFERENTIAL includes but not limited to  Atrial fibrillation with rapid ventricular response, dehydration, hypomagnesemia ____________________________________________   LABS (all labs ordered are listed, but only abnormal results are displayed)  Labs Reviewed  COMPREHENSIVE METABOLIC PANEL - Abnormal; Notable for the following components:      Result Value   Glucose, Bld 123 (*)    BUN 22 (*)    GFR calc non Af Amer 49 (*)    GFR calc Af Amer 57 (*)    All other components within normal limits  TROPONIN I - Abnormal; Notable for the following components:   Troponin I 0.03 (*)    All other components within normal limits  URINALYSIS, COMPLETE (UACMP) WITH MICROSCOPIC - Abnormal; Notable for the following components:   Color, Urine YELLOW (*)    APPearance CLEAR (*)    Hgb urine dipstick SMALL (*)    Ketones, ur 5 (*)    Protein, ur 30 (*)    Leukocytes, UA TRACE (*)    Bacteria, UA RARE (*)    All other components within normal limits  CBC WITH DIFFERENTIAL/PLATELET  TSH  T4, FREE    Lab work reviewed by me with slightly elevated troponin  likely secondary to demand and not primary myocardial ischemia __________________________________________  EKG  ED ECG REPORT I, Merrily Brittle, the attending physician, personally viewed and interpreted this ECG.  Date: 11/19/2017 EKG Time:  Rate: 88 Rhythm: Atrial fibrillation QRS Axis: normal Intervals: normal ST/T Wave abnormalities: normal Narrative Interpretation: no evidence of acute ischemia  ____________________________________________  RADIOLOGY  Chest x-ray reviewed by me with chronic changes and no acute disease noted ____________________________________________   PROCEDURES  Procedure(s) performed: no  Procedures  Critical Care performed: no  Observation: no ____________________________________________   INITIAL IMPRESSION / ASSESSMENT AND PLAN / ED COURSE  Pertinent labs & imaging results that were available during my care of the patient were reviewed by me and considered in my medical decision making (see chart for details).  The patient arrives quite well-appearing although with slightly elevated blood pressure.  She saw her cardiologist most recently yesterday and she is supposed to take clonidine 0.1 mg whenever her blood pressure is above 160 systolic so I will give her a single dose now along with checking general labs including a TSH and free T4.     The patient feels improved.  She is able to get up and ambulate.  She is a symptomatic.  She is discharged home in improved condition verbalizes understanding and agreement with the plan. ____________________________________________   FINAL CLINICAL IMPRESSION(S) / ED DIAGNOSES  Final diagnoses:  Hypertension, unspecified type  Malaise      NEW MEDICATIONS STARTED DURING THIS VISIT:  Discharge Medication List as of 11/20/2017  1:11 AM       Note:  This document was prepared using Dragon voice recognition software and may include unintentional dictation errors.     Merrily Brittle,  MD 11/21/17 [phone]

## 2017-11-19 NOTE — ED Triage Notes (Signed)
Pt arrives ambulatory to triage with c/o hypertension 220/110 at home. Pt states that she "was misbehaving at home" and felt like her HR was elevated and then took her BP. Pt is in NAD.

## 2017-11-20 LAB — COMPREHENSIVE METABOLIC PANEL
ALT: 18 U/L (ref 14–54)
AST: 31 U/L (ref 15–41)
Albumin: 4.4 g/dL (ref 3.5–5.0)
Alkaline Phosphatase: 65 U/L (ref 38–126)
Anion gap: 11 (ref 5–15)
BUN: 22 mg/dL — ABNORMAL HIGH (ref 6–20)
CALCIUM: 9 mg/dL (ref 8.9–10.3)
CHLORIDE: 101 mmol/L (ref 101–111)
CO2: 25 mmol/L (ref 22–32)
CREATININE: 1 mg/dL (ref 0.44–1.00)
GFR calc non Af Amer: 49 mL/min — ABNORMAL LOW (ref >60)
GFR, EST AFRICAN AMERICAN: 57 mL/min — AB (ref >60)
Glucose, Bld: 123 mg/dL — ABNORMAL HIGH (ref 65–99)
Potassium: 3.9 mmol/L (ref 3.5–5.1)
Sodium: 137 mmol/L (ref 135–145)
TOTAL PROTEIN: 6.9 g/dL (ref 6.5–8.1)
Total Bilirubin: 1 mg/dL (ref 0.3–1.2)

## 2017-11-20 LAB — CBC WITH DIFFERENTIAL/PLATELET
BASOS ABS: 0 10*3/uL (ref 0–0.1)
Basophils Relative: 1 %
EOS ABS: 0.1 10*3/uL (ref 0–0.7)
EOS PCT: 2 %
HCT: 39.1 % (ref 35.0–47.0)
Hemoglobin: 13.1 g/dL (ref 12.0–16.0)
LYMPHS PCT: 18 %
Lymphs Abs: 1 10*3/uL (ref 1.0–3.6)
MCH: 29.1 pg (ref 26.0–34.0)
MCHC: 33.6 g/dL (ref 32.0–36.0)
MCV: 86.6 fL (ref 80.0–100.0)
Monocytes Absolute: 0.5 10*3/uL (ref 0.2–0.9)
Monocytes Relative: 10 %
Neutro Abs: 3.8 10*3/uL (ref 1.4–6.5)
Neutrophils Relative %: 69 %
PLATELETS: 184 10*3/uL (ref 150–440)
RBC: 4.52 MIL/uL (ref 3.80–5.20)
RDW: 13.9 % (ref 11.5–14.5)
WBC: 5.4 10*3/uL (ref 3.6–11.0)

## 2017-11-20 LAB — URINALYSIS, COMPLETE (UACMP) WITH MICROSCOPIC
BILIRUBIN URINE: NEGATIVE
Glucose, UA: NEGATIVE mg/dL
KETONES UR: 5 mg/dL — AB
Nitrite: NEGATIVE
PH: 6 (ref 5.0–8.0)
Protein, ur: 30 mg/dL — AB
SPECIFIC GRAVITY, URINE: 1.012 (ref 1.005–1.030)

## 2017-11-20 LAB — TSH: TSH: 4.318 u[IU]/mL (ref 0.350–4.500)

## 2017-11-20 LAB — T4, FREE: Free T4: 1.33 ng/dL (ref 0.82–1.77)

## 2017-11-20 LAB — TROPONIN I: TROPONIN I: 0.03 ng/mL — AB (ref <0.03)

## 2017-11-20 NOTE — ED Notes (Signed)
Pt up to bedside commode. Pt has a steady gait. Denies dizziness.

## 2017-11-20 NOTE — Discharge Instructions (Signed)
Fortunately today your blood work was reassuring.  Please make an appointment to follow-up with your primary care physician tomorrow for reevaluation and return to the emergency department sooner for any concerns whatsoever.  It was a pleasure to take care of you today, and thank you for coming to our emergency department.  If you have any questions or concerns before leaving please ask the nurse to grab me and I'm more than happy to go through your aftercare instructions again.  If you were prescribed any opioid pain medication today such as Norco, Vicodin, Percocet, morphine, hydrocodone, or oxycodone please make sure you do not drive when you are taking this medication as it can alter your ability to drive safely.  If you have any concerns once you are home that you are not improving or are in fact getting worse before you can make it to your follow-up appointment, please do not hesitate to call 911 and come back for further evaluation.  Tiffany Brittle, MD  Results for orders placed or performed during the hospital encounter of 11/19/17  Comprehensive metabolic panel  Result Value Ref Range   Sodium 137 135 - 145 mmol/L   Potassium 3.9 3.5 - 5.1 mmol/L   Chloride 101 101 - 111 mmol/L   CO2 25 22 - 32 mmol/L   Glucose, Bld 123 (H) 65 - 99 mg/dL   BUN 22 (H) 6 - 20 mg/dL   Creatinine, Ser 3.29 0.44 - 1.00 mg/dL   Calcium 9.0 8.9 - 19.1 mg/dL   Total Protein 6.9 6.5 - 8.1 g/dL   Albumin 4.4 3.5 - 5.0 g/dL   AST 31 15 - 41 U/L   ALT 18 14 - 54 U/L   Alkaline Phosphatase 65 38 - 126 U/L   Total Bilirubin 1.0 0.3 - 1.2 mg/dL   GFR calc non Af Amer 49 (L) >60 mL/min   GFR calc Af Amer 57 (L) >60 mL/min   Anion gap 11 5 - 15  Troponin I  Result Value Ref Range   Troponin I 0.03 (HH) <0.03 ng/mL  CBC with Differential  Result Value Ref Range   WBC 5.4 3.6 - 11.0 K/uL   RBC 4.52 3.80 - 5.20 MIL/uL   Hemoglobin 13.1 12.0 - 16.0 g/dL   HCT 66.0 60.0 - 45.9 %   MCV 86.6 80.0 - 100.0 fL     MCH 29.1 26.0 - 34.0 pg   MCHC 33.6 32.0 - 36.0 g/dL   RDW 97.7 41.4 - 23.9 %   Platelets 184 150 - 440 K/uL   Neutrophils Relative % 69 %   Neutro Abs 3.8 1.4 - 6.5 K/uL   Lymphocytes Relative 18 %   Lymphs Abs 1.0 1.0 - 3.6 K/uL   Monocytes Relative 10 %   Monocytes Absolute 0.5 0.2 - 0.9 K/uL   Eosinophils Relative 2 %   Eosinophils Absolute 0.1 0 - 0.7 K/uL   Basophils Relative 1 %   Basophils Absolute 0.0 0 - 0.1 K/uL  Urinalysis, Complete w Microscopic  Result Value Ref Range   Color, Urine YELLOW (A) YELLOW   APPearance CLEAR (A) CLEAR   Specific Gravity, Urine 1.012 1.005 - 1.030   pH 6.0 5.0 - 8.0   Glucose, UA NEGATIVE NEGATIVE mg/dL   Hgb urine dipstick SMALL (A) NEGATIVE   Bilirubin Urine NEGATIVE NEGATIVE   Ketones, ur 5 (A) NEGATIVE mg/dL   Protein, ur 30 (A) NEGATIVE mg/dL   Nitrite NEGATIVE NEGATIVE   Leukocytes, UA  TRACE (A) NEGATIVE   RBC / HPF 0-5 0 - 5 RBC/hpf   WBC, UA 11-20 0 - 5 WBC/hpf   Bacteria, UA RARE (A) NONE SEEN   Squamous Epithelial / LPF 0-5 0 - 5   Mucus PRESENT   TSH  Result Value Ref Range   TSH 4.318 0.350 - 4.500 uIU/mL  T4, free  Result Value Ref Range   Free T4 1.33 0.82 - 1.77 ng/dL   Dg Chest Port 1 View  Result Date: 11/20/2017 CLINICAL DATA:  Hypertension at home.  Elevated heart rate. EXAM: PORTABLE CHEST 1 VIEW COMPARISON:  12/07/2014 FINDINGS: Cardiac enlargement. No vascular congestion, edema, or consolidation. Emphysematous changes in the lungs. Central peribronchial thickening with central interstitial pattern consistent with chronic bronchitis. No blunting of costophrenic angles. No pneumothorax. Mediastinal contours appear intact. Calcification of the aorta. IMPRESSION: Emphysematous and chronic bronchitic changes in the lungs. Cardiac enlargement. No evidence of active pulmonary disease. Electronically Signed   By: Burman Nieves M.D.   On: 11/20/2017 00:01

## 2017-11-22 ENCOUNTER — Encounter: Payer: Self-pay | Admitting: Emergency Medicine

## 2017-11-22 ENCOUNTER — Emergency Department
Admission: EM | Admit: 2017-11-22 | Discharge: 2017-11-22 | Disposition: A | Discharge status: Home / Self Care | Payer: Medicare Other | Attending: Emergency Medicine | Admitting: Emergency Medicine

## 2017-11-22 DIAGNOSIS — Z79899 Other long term (current) drug therapy: Secondary | ICD-10-CM | POA: Insufficient documentation

## 2017-11-22 DIAGNOSIS — E039 Hypothyroidism, unspecified: Secondary | ICD-10-CM | POA: Insufficient documentation

## 2017-11-22 DIAGNOSIS — Z7901 Long term (current) use of anticoagulants: Secondary | ICD-10-CM | POA: Diagnosis not present

## 2017-11-22 DIAGNOSIS — I10 Essential (primary) hypertension: Secondary | ICD-10-CM | POA: Diagnosis not present

## 2017-11-22 DIAGNOSIS — Z8673 Personal history of transient ischemic attack (TIA), and cerebral infarction without residual deficits: Secondary | ICD-10-CM | POA: Diagnosis not present

## 2017-11-22 MED ORDER — LISINOPRIL 10 MG PO TABS
10.0000 mg | ORAL_TABLET | Freq: Once | ORAL | Status: AC
  Administered 2017-11-22: 10 mg via ORAL
  Filled 2017-11-22: qty 1

## 2017-11-22 MED ORDER — ATENOLOL 25 MG PO TABS
25.0000 mg | ORAL_TABLET | Freq: Once | ORAL | Status: AC
  Administered 2017-11-22: 25 mg via ORAL
  Filled 2017-11-22: qty 1

## 2017-11-22 MED ORDER — AMLODIPINE BESYLATE 5 MG PO TABS
5.0000 mg | ORAL_TABLET | Freq: Once | ORAL | Status: AC
  Administered 2017-11-22: 5 mg via ORAL
  Filled 2017-11-22: qty 1

## 2017-11-22 NOTE — ED Triage Notes (Signed)
Pt reports she is having high blood pressure readings this AM, without symptoms. Pt took Clonidine at 0630. Pt seen in ED this week for same.

## 2017-11-22 NOTE — Discharge Instructions (Addendum)
Try taking a 5 mg Melatonin 30 minutes prior to going to sleep for your difficulty sleeping

## 2017-11-22 NOTE — ED Provider Notes (Signed)
[facility] Emergency Department Provider Note   ____________________________________________    I have reviewed the triage vital signs and the nursing notes.   HISTORY  Chief Complaint Hypertension     HPI Tiffany Grimes is a 84 y.o. female with a history of essential hypertension, A. fib on Xarelto who presents with complaints of high blood pressure.  She has no physical complaints.  She reports she took a clonidine at 6:30 AM before coming to the emergency department as directed by her cardiologist.  She has not taken her morning blood pressure medications yet.  No headaches, no chest pain no palpitations no shortness of breath.  No lower extremity edema.  Seen here 2 days ago for similar, saw her cardiologist earlier in the week for blood pressure as well  Past Medical History:  Diagnosis Date  . Atrial fibrillation (HCC)   . Barrett esophagus    due for EGD July 2012, [place]  . Dysrhythmia   . Hx of thyroid nodule July 2012   benign biopsy   . Hypertension   . Hypothyroidism   . Lentigo   . Reflux   . Thyroid disease   . TIA (transient ischemic attack) June 2012  . TIA (transient ischemic attack) June 2012  . Wears dentures    partial upper  . Wears hearing aid     Patient Active Problem List   Diagnosis Date Noted  . Carotid artery stenosis 02/28/2017  . Underweight 07/30/2015  . Furrowed tongue 07/30/2015  . Centrilobular emphysema (HCC) 04/18/2014  . Alpha-1-antitrypsin deficiency carrier (HCC) 04/18/2014  . Unspecified hypothyroidism 04/07/2014  . Multiple pulmonary nodules 10/05/2013  . Pulmonary nodules/lesions, multiple 08/31/2013  . PAD (peripheral artery disease) (HCC) 08/08/2013  . External hemorrhoid 08/02/2013  . Anal fissure 08/02/2013  . Ecchymosis 05/10/2013  . Anxiety state 10/28/2012  . Routine general medical examination at a health care facility 06/15/2012  . 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  Osteoporosis, post-menopausal 02/11/2012  . Lumbago with sciatica of right side 02/11/2012  . Posterior neck pain 02/11/2012  . Hyperlipidemia LDL goal <100 12/16/2011  . Hx of thyroid nodule   . Atrial fibrillation, new onset (HCC) 08/02/2011  . Chest pain, atypical 08/02/2011  . TIA (transient ischemic attack)   . Barrett esophagus   . Hypertension 03/29/2011    Past Surgical History:  Procedure Laterality Date  . ABDOMINAL HYSTERECTOMY    . CATARACT EXTRACTION W/PHACO Right 07/29/2016   Procedure: CATARACT EXTRACTION PHACO AND INTRAOCULAR LENS PLACEMENT (IOC);  Surgeon: Sherald Hess, MD;  Location: [facility];  Service: Ophthalmology;  Laterality: Right;  right  . CATARACT EXTRACTION W/PHACO Left 07/02/2017   Procedure: CATARACT EXTRACTION PHACO AND INTRAOCULAR LENS PLACEMENT (IOC);  Surgeon: Nevada Crane, MD;  Location: [facility];  Service: Ophthalmology;  Laterality: Left;  [place] 00:50.9 AP% 14.8 CDE 7.54 Fluid Pack Lot # [id number]  . CESAREAN SECTION    . HERNIA REPAIR  2008   left inguinal  . THYROID SURGERY  june 18   nodule removed    Prior to Admission medications   Medication Sig Start Date End Date Taking? Authorizing Provider  amLODipine (NORVASC) 5 MG tablet TAKE 1 TABLET BY MOUTH DAILY. 06/11/16   Sherlene Shams, MD  atenolol (TENORMIN) 25 MG tablet TAKE 1 TABLET TWICE A DAY 12/21/15   Sherlene Shams, MD  atorvastatin (LIPITOR) 20 MG tablet TAKE 1 TABLET DAILY 07/14/15   Duncan Dull  L, MD  escitalopram (LEXAPRO) 10 MG tablet TAKE 1 TABLET BY MOUTH DAILY. Patient not taking: Reported on 06/27/2017 03/14/15   Sherlene Shams, MD  esomeprazole (NEXIUM) 40 MG capsule TAKE 1 CAPSULE DAILY BEFORE BREAKFAST 04/01/16   Sherlene Shams, MD  fluticasone (FLONASE) 50 MCG/ACT nasal spray Place 2 sprays into both nostrils daily as needed. 2 sprays into each nostril daily 07/28/14   Sherlene Shams, MD  glucose blood test strip Use as instructed Patient not  taking: Reported on 07/29/2016 08/28/15   Sherlene Shams, MD  latanoprost (XALATAN) 0.005 % ophthalmic solution Place 1 drop into both eyes at bedtime.     [provider]  levothyroxine (SYNTHROID, LEVOTHROID) 75 MCG tablet Take 75 mcg daily before breakfast by mouth.    [provider]  lisinopril (PRINIVIL,ZESTRIL) 10 MG tablet Take 1 tablet (10 mg total) by mouth 2 (two) times daily. NEED TO SCHEDULE OFFICE VISIT WITH PCP 06/03/16   Sherlene Shams, MD  sucralfate (CARAFATE) 1 g tablet Take 1 tablet (1 g total) by mouth 2 (two) times daily. Patient taking differently: Take 2 g by mouth daily.  08/28/15   Sherlene Shams, MD  XARELTO 15 MG TABS tablet TAKE 1 TABLET DAILY 11/06/15   Sherlene Shams, MD     Allergies Neomycin  Family History  Problem Relation Age of Onset  . Hypertension Mother   . Stroke Mother   . Heart disease Father 43       AMI  . Hypertension Father   . Vision loss Paternal Aunt   . Cancer Neg Hx     Social History Social History   Tobacco Use  . Smoking status: Never Smoker  . Smokeless tobacco: Never Used  Substance Use Topics  . Alcohol use: No  . Drug use: No    Review of Systems  Constitutional: No dizziness Eyes: No visual changes.  ENT: No neck pain Cardiovascular: Denies chest pain.  No palpitations Respiratory: Denies shortness of breath. Gastrointestinal: No abdominal pain.    Genitourinary: Negative for dysuria. Musculoskeletal: Negative for back pain. Skin: Negative for rash. Neurological: Negative for headaches    ____________________________________________   PHYSICAL EXAM:  VITAL SIGNS: ED Triage Vitals [11/22/17 0645]  Enc Vitals Group     BP (!) 198/102     Pulse Rate 83     Resp 16     Temp 97.7 F (36.5 C)     Temp src      SpO2 99 %     Weight 52.2 kg (115 lb)     Height      Head Circumference      Peak Flow      Pain Score 0     Pain Loc      Pain Edu?      Excl. in GC?      Constitutional: Alert and oriented. No acute distress.     Mouth/Throat: Mucous membranes are moist.    Cardiovascular: Normal rate, regular rhythm. Grossly normal heart sounds.  Good peripheral circulation. Respiratory: Normal respiratory effort.  No retractions. Lungs CTAB. Gastrointestinal:  No distention.    Musculoskeletal: No lower extremity tenderness nor edema.  Warm and well perfused Neurologic:  Normal speech and language. No gross focal neurologic deficits are appreciated.  Skin:  Skin is warm, dry and intact. No rash noted. Psychiatric: Mood and affect are normal. Speech and behavior are normal.  ____________________________________________   LABS (all labs  ordered are listed, but only abnormal results are displayed)  Labs Reviewed - No data to display ____________________________________________  EKG  None ____________________________________________  RADIOLOGY   ____________________________________________   PROCEDURES  Procedure(s) performed: No  Procedures   Critical Care performed: No ____________________________________________   INITIAL IMPRESSION / ASSESSMENT AND PLAN / ED COURSE  Pertinent labs & imaging results that were available during my care of the patient were reviewed by me and considered in my medical decision making (see chart for details).  Patient well appearing in no acute distress, asymptomatic high blood pressure.  Second visit in a week for the same, third visit if we include cardiologist outpatient visit.  Patient to clonidine just prior to arrival.  Will give p.o. home medications and recheck blood pressure  Blood pressure improved with p.o. medications.  Recommend follow-up with PCP for adjustment of medications    ____________________________________________   FINAL CLINICAL IMPRESSION(S) / ED DIAGNOSES  Final diagnoses:  Essential hypertension        Note:  This document was prepared using Dragon voice  recognition software and may include unintentional dictation errors.    Jene Every, MD 11/22/17 [phone]

## 2018-01-03 ENCOUNTER — Other Ambulatory Visit: Payer: Self-pay

## 2018-01-03 ENCOUNTER — Emergency Department: Payer: Medicare Other

## 2018-01-03 ENCOUNTER — Encounter: Payer: Self-pay | Admitting: *Deleted

## 2018-01-03 ENCOUNTER — Emergency Department
Admission: EM | Admit: 2018-01-03 | Discharge: 2018-01-03 | Disposition: A | Discharge status: Home / Self Care | Payer: Medicare Other | Attending: Emergency Medicine | Admitting: Emergency Medicine

## 2018-01-03 DIAGNOSIS — Z8673 Personal history of transient ischemic attack (TIA), and cerebral infarction without residual deficits: Secondary | ICD-10-CM | POA: Diagnosis not present

## 2018-01-03 DIAGNOSIS — R002 Palpitations: Secondary | ICD-10-CM | POA: Diagnosis not present

## 2018-01-03 DIAGNOSIS — E039 Hypothyroidism, unspecified: Secondary | ICD-10-CM | POA: Insufficient documentation

## 2018-01-03 DIAGNOSIS — I10 Essential (primary) hypertension: Secondary | ICD-10-CM | POA: Insufficient documentation

## 2018-01-03 LAB — CBC
HCT: 44.3 % (ref 35.0–47.0)
Hemoglobin: 14.7 g/dL (ref 12.0–16.0)
MCH: 28.5 pg (ref 26.0–34.0)
MCHC: 33.2 g/dL (ref 32.0–36.0)
MCV: 86 fL (ref 80.0–100.0)
Platelets: 192 10*3/uL (ref 150–440)
RBC: 5.15 MIL/uL (ref 3.80–5.20)
RDW: 13.6 % (ref 11.5–14.5)
WBC: 5.2 10*3/uL (ref 3.6–11.0)

## 2018-01-03 LAB — BASIC METABOLIC PANEL
Anion gap: 9 (ref 5–15)
BUN: 18 mg/dL (ref 6–20)
CALCIUM: 9 mg/dL (ref 8.9–10.3)
CHLORIDE: 102 mmol/L (ref 101–111)
CO2: 27 mmol/L (ref 22–32)
Creatinine, Ser: 0.85 mg/dL (ref 0.44–1.00)
GFR calc Af Amer: >60 mL/min (ref >60)
GFR, EST NON AFRICAN AMERICAN: 60 mL/min — AB (ref >60)
Glucose, Bld: 123 mg/dL — ABNORMAL HIGH (ref 65–99)
POTASSIUM: 3.7 mmol/L (ref 3.5–5.1)
SODIUM: 138 mmol/L (ref 135–145)

## 2018-01-03 LAB — TROPONIN I: TROPONIN I: 0.03 ng/mL — AB (ref <0.03)

## 2018-01-03 LAB — TSH: TSH: 1.859 u[IU]/mL (ref 0.350–4.500)

## 2018-01-03 MED ORDER — ATENOLOL 25 MG PO TABS
25.0000 mg | ORAL_TABLET | Freq: Once | ORAL | Status: AC
  Administered 2018-01-03: 25 mg via ORAL
  Filled 2018-01-03: qty 1

## 2018-01-03 MED ORDER — AMLODIPINE BESYLATE 5 MG PO TABS
10.0000 mg | ORAL_TABLET | Freq: Once | ORAL | Status: AC
  Administered 2018-01-03: 10 mg via ORAL
  Filled 2018-01-03: qty 2

## 2018-01-03 MED ORDER — LISINOPRIL 10 MG PO TABS
10.0000 mg | ORAL_TABLET | Freq: Once | ORAL | Status: AC
  Administered 2018-01-03: 10 mg via ORAL
  Filled 2018-01-03: qty 1

## 2018-01-03 MED ORDER — AMLODIPINE BESYLATE 5 MG PO TABS: 5.0000 mg | ORAL_TABLET | Freq: Once | ORAL | Status: DC

## 2018-01-03 NOTE — ED Notes (Signed)
Patient transported to X-ray 

## 2018-01-03 NOTE — ED Triage Notes (Signed)
Pt presents to the ED with her husband, c/o high blood pressure. States she felt her "chest pounding and heart beating rapidly" and checked her bp that was elevated to 214/142 and she took her clonidine dose for the same. Denies having pain.

## 2018-01-03 NOTE — ED Provider Notes (Addendum)
[facility] Emergency Department Provider Note  ____________________________________________  Time seen: Approximately 8:30 AM  I have reviewed the triage vital signs and the nursing notes.   HISTORY  Chief Complaint Hypertension    HPI Tiffany Grimes is a 84 y.o. female a history of hypertension, atrial fibrillation on Xarelto, hypothyroidism, resenting for palpitations.  The patient reports that every few months, she has an episode of palpitations, similar to today.  She reports that she woke up at 6 AM, stood up, and had a fast heart rate that was not associated with any chest pain, shortness of breath, lightheadedness or syncope, diaphoresis, nausea or vomiting.  She took a tablet of 0.1 mg clonidine which is her routine medication, and the symptoms immediately resolved.  She continues to be asymptomatic.  She has not had any recent changes in her medications, or any other illness.  Her cardiologist is Dr. Gwen Pounds.  Past Medical History:  Diagnosis Date  . Atrial fibrillation (HCC)   . Atrial fibrillation (HCC)   . Barrett esophagus    due for EGD July 2012, [place]  . Dysrhythmia   . Hx of thyroid nodule July 2012   benign biopsy   . Hypertension   . Hypothyroidism   . Lentigo   . Reflux   . Thyroid disease   . TIA (transient ischemic attack) June 2012  . TIA (transient ischemic attack) June 2012  . Wears dentures    partial upper  . Wears hearing aid     Patient Active Problem List   Diagnosis Date Noted  . Carotid artery stenosis 02/28/2017  . Underweight 07/30/2015  . Furrowed tongue 07/30/2015  . Centrilobular emphysema (HCC) 04/18/2014  . Alpha-1-antitrypsin deficiency carrier (HCC) 04/18/2014  . Unspecified hypothyroidism 04/07/2014  . Multiple pulmonary nodules 10/05/2013  . Pulmonary nodules/lesions, multiple 08/31/2013  . PAD (peripheral artery disease) (HCC) 08/08/2013  . External hemorrhoid 08/02/2013  . Anal fissure 08/02/2013   . Ecchymosis 05/10/2013  . Anxiety state 10/28/2012  . Routine general medical examination at a health care facility 06/15/2012  . 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 Osteoporosis, post-menopausal 02/11/2012  . Lumbago with sciatica of right side 02/11/2012  . Posterior neck pain 02/11/2012  . Hyperlipidemia LDL goal <100 12/16/2011  . Hx of thyroid nodule   . Atrial fibrillation, new onset (HCC) 08/02/2011  . Chest pain, atypical 08/02/2011  . TIA (transient ischemic attack)   . Barrett esophagus   . Hypertension 03/29/2011    Past Surgical History:  Procedure Laterality Date  . ABDOMINAL HYSTERECTOMY    . CATARACT EXTRACTION W/PHACO Right 07/29/2016   Procedure: CATARACT EXTRACTION PHACO AND INTRAOCULAR LENS PLACEMENT (IOC);  Surgeon: Sherald Hess, MD;  Location: [facility];  Service: Ophthalmology;  Laterality: Right;  right  . CATARACT EXTRACTION W/PHACO Left 07/02/2017   Procedure: CATARACT EXTRACTION PHACO AND INTRAOCULAR LENS PLACEMENT (IOC);  Surgeon: Nevada Crane, MD;  Location: [facility];  Service: Ophthalmology;  Laterality: Left;  [place] 00:50.9 AP% 14.8 CDE 7.54 Fluid Pack Lot # [id number]  . CESAREAN SECTION    . HERNIA REPAIR  2008   left inguinal  . THYROID SURGERY  june 18   nodule removed    Current Outpatient Rx  . Order #: [id number] Class: Normal  . Order #: [id number] Class: Normal  . Order #: [id number] Class: Normal  . Order #: [id number] Class: Historical Med  . Order #: [id number] Class: Normal  . Order #: [id number] Class: Normal  . Order #:  [id number] Class: Historical Med  . Order #: [id number] Class: Historical Med  . Order #: [id number] Class: Historical Med  . Order #: [id number] Class: Normal  . Order #: [id number] Class: Normal    Allergies Neomycin  Family History  Problem Relation Age of Onset  . Hypertension Mother   . Stroke Mother   . Heart disease Father 23       AMI  . Hypertension Father   . Vision loss  Paternal Aunt   . Cancer Neg Hx     Social History Social History   Tobacco Use  . Smoking status: Never Smoker  . Smokeless tobacco: Never Used  Substance Use Topics  . Alcohol use: No  . Drug use: No    Review of Systems Constitutional: No fever/chills.  No lightheadedness or syncope.  No diaphoresis. Eyes: No visual changes. ENT: No sore throat. No congestion or rhinorrhea. Cardiovascular: Denies chest pain.  Positive palpitations. Respiratory: Denies shortness of breath.  No cough. Gastrointestinal: No abdominal pain.  No nausea, no vomiting.  No diarrhea.  No constipation. Genitourinary: Negative for dysuria. Musculoskeletal: Negative for back pain. Skin: Negative for rash. Neurological: Negative for headaches. No focal numbness, tingling or weakness.     ____________________________________________   PHYSICAL EXAM:  VITAL SIGNS: ED Triage Vitals  Enc Vitals Group     BP 01/03/18 0639 (!) 199/117     Pulse Rate 01/03/18 0639 93     Resp 01/03/18 0639 16     Temp 01/03/18 0639 98.1 F (36.7 C)     Temp Source 01/03/18 0639 Oral     SpO2 01/03/18 0639 98 %     Weight --      Height --      Head Circumference --      Peak Flow --      Pain Score 01/03/18 0638 0     Pain Loc --      Pain Edu? --      Excl. in GC? --     Constitutional: Alert and oriented.  Chronically ill appearing but nontoxic. Answers questions appropriately. Eyes: Conjunctivae are normal.  EOMI. No scleral icterus. Head: Atraumatic. Nose: No congestion/rhinnorhea. Mouth/Throat: Mucous membranes are moist.  Neck: No stridor.  Supple.  Mild JVD. Cardiovascular: Initial blood pressure upon arrival was significantly elevated; repeat blood pressure on my examination was 161/81.  Normal rate, irregular rhythm.  Prominent diastolic phase without murmurs, rubs or gallops.  Respiratory: Normal respiratory effort.  No accessory muscle use or retractions. Lungs CTAB.  No wheezes, rales or  ronchi. Gastrointestinal: Soft, nontender and nondistended.  No guarding or rebound.  No peritoneal signs. Musculoskeletal: No LE edema. No ttp in the calves or palpable cords.  Negative Homan's sign. Neurologic:  A&Ox3.  Speech is clear.  Face and smile are symmetric.  EOMI.  Moves all extremities well. Skin:  Skin is warm, dry and intact. No rash noted. Psychiatric: Mood and affect are normal. Speech and behavior are normal.  Normal judgement.  ____________________________________________   LABS (all labs ordered are listed, but only abnormal results are displayed)  Labs Reviewed  BASIC METABOLIC PANEL - Abnormal; Notable for the following components:      Result Value   Glucose, Bld 123 (*)    GFR calc non Af Amer 60 (*)    All other components within normal limits  TROPONIN I - Abnormal; Notable for the following components:   Troponin I 0.03 (*)    All other components within normal limits  CBC  TROPONIN I  TSH   ____________________________________________  EKG  ED ECG REPORT I, Rockne Menghini, the attending physician, personally viewed and interpreted this ECG.   Date: 01/03/2018  EKG Time: 638  Rate: 89  Rhythm: afib  Axis: normal  Intervals:none  ST&T Change: Nonspecific T wave inversion in V1.  No STEMI.  This EKG is compared to 11/20/2017, which also showed rate controlled A. fib.  ____________________________________________  RADIOLOGY  Dg Chest 2 View  Result Date: 01/03/2018 CLINICAL DATA:  Hypertension, tachycardia. EXAM: CHEST - 2 VIEW COMPARISON:  11/19/2017. FINDINGS: Trachea is midline. Heart is enlarged. Thoracic aorta is calcified. Lungs are hyperinflated but clear. No pleural fluid. Scoliosis. IMPRESSION: 1. Hyperinflation without acute finding. 2.  Aortic atherosclerosis (ICD10-170.0). Electronically Signed   By: Leanna Battles M.D.   On: 01/03/2018 07:50    ____________________________________________   PROCEDURES  Procedure(s)  performed: None  Procedures  Critical Care performed: No ____________________________________________   INITIAL IMPRESSION / ASSESSMENT AND PLAN / ED COURSE  Pertinent labs & imaging results that were available during my care of the patient were reviewed by me and considered in my medical decision making (see chart for details).  84 y.o. email with a history of A. fib on Xarelto, hypertension, and hypothyroidism presenting with palpitations.  Overall, the patient presents markedly hypertensive, but this has improved without any intervention here; she did take clonidine before she left the house and will be supplemented with her home antihypertensives at this time.  The patient denies any associated chest pain, and I do not see any ischemic changes on her EKG; ACS or MI is very unlikely but we will plan a second troponin III hours after the first 1.  A TSH has also been added to her laboratory studies as her palpitations could be due to thyroid imbalance.  A intermittent arrhythmia that is not captured in the emergency department as the patient is asymptomatic upon arrival, is also possible.  Here, the patient's electrolytes are reassuring, her blood counts are stable, her first troponin is negative and her chest x-ray does not show any acute cardiopulmonary process.  Plan reevaluation for final disposition.  I have reviewed the patient's medical chart and she has 2 visits in May for hypertension.  The patient had reassuring ED visits and was discharged to outpatient cardiology care.  ----------------------------------------- 10:52 AM on 01/03/2018 -----------------------------------------  The patient continues to be hemodynamically stable.  Her heart rate after atenolol and clonidine is bradycardic in the 50s but she is asymptomatic.  Her repeat troponin is 0.03.  Her TSH is also within normal limits.  I have spoken with Dr. Juliann Pares, and given that the patient continues to be asymptomatic,  that her palpitations are chronically intermittent for her, and that she has no true signs or symptoms of ischemia today, the patient will be safe for discharge her home with outpatient follow-up with her primary cardiologist, Dr. Gwen Pounds, I did discuss follow-up instructions as well as return precautions with the patient and her husband who demonstrated understanding. ____________________________________________  FINAL CLINICAL IMPRESSION(S) / ED DIAGNOSES  Final diagnoses:  Palpitations         NEW MEDICATIONS STARTED DURING THIS VISIT:  New Prescriptions   No medications on file      Rockne Menghini, MD 01/03/18 [id number]    Rockne Menghini, MD 01/03/18 1053

## 2018-01-03 NOTE — ED Notes (Signed)
Pt ambulated to restroom without difficulty

## 2018-01-03 NOTE — Discharge Instructions (Addendum)
Continue to take all of your medications as prescribed.  Return to the emergency department if you develop severe pain, lightheadedness or fainting, palpitations, shortness of breath, or any other symptoms concerning to you.

## 2018-01-16 ENCOUNTER — Encounter: Payer: Self-pay | Admitting: Emergency Medicine

## 2018-01-16 ENCOUNTER — Emergency Department
Admission: EM | Admit: 2018-01-16 | Discharge: 2018-01-16 | Disposition: A | Discharge status: Home / Self Care | Payer: Medicare Other | Attending: Emergency Medicine | Admitting: Emergency Medicine

## 2018-01-16 DIAGNOSIS — I10 Essential (primary) hypertension: Secondary | ICD-10-CM | POA: Diagnosis present

## 2018-01-16 DIAGNOSIS — Z79899 Other long term (current) drug therapy: Secondary | ICD-10-CM | POA: Diagnosis not present

## 2018-01-16 DIAGNOSIS — E039 Hypothyroidism, unspecified: Secondary | ICD-10-CM | POA: Insufficient documentation

## 2018-01-16 DIAGNOSIS — I160 Hypertensive urgency: Secondary | ICD-10-CM | POA: Insufficient documentation

## 2018-01-16 LAB — BASIC METABOLIC PANEL
ANION GAP: 8 (ref 5–15)
BUN: 23 mg/dL (ref 8–23)
CO2: 25 mmol/L (ref 22–32)
Calcium: 8.8 mg/dL — ABNORMAL LOW (ref 8.9–10.3)
Chloride: 106 mmol/L (ref 98–111)
Creatinine, Ser: 0.82 mg/dL (ref 0.44–1.00)
Glucose, Bld: 135 mg/dL — ABNORMAL HIGH (ref 70–99)
Potassium: 3.9 mmol/L (ref 3.5–5.1)
SODIUM: 139 mmol/L (ref 135–145)

## 2018-01-16 LAB — CBC
HEMATOCRIT: 40.6 % (ref 35.0–47.0)
Hemoglobin: 13.6 g/dL (ref 12.0–16.0)
MCH: 28.5 pg (ref 26.0–34.0)
MCHC: 33.5 g/dL (ref 32.0–36.0)
MCV: 85.1 fL (ref 80.0–100.0)
Platelets: 183 10*3/uL (ref 150–440)
RBC: 4.77 MIL/uL (ref 3.80–5.20)
RDW: 13.5 % (ref 11.5–14.5)
WBC: 6.2 10*3/uL (ref 3.6–11.0)

## 2018-01-16 MED ORDER — CLONIDINE HCL 0.1 MG PO TABS
0.1000 mg | ORAL_TABLET | Freq: Once | ORAL | Status: AC
  Administered 2018-01-16: 0.1 mg via ORAL
  Filled 2018-01-16: qty 1

## 2018-01-16 NOTE — ED Triage Notes (Signed)
Pt arrived via ems from home with complaints of uncontrolled high blood pressure. Pt is prescribed blood pressure medication and reports taking them as prescribed. Pt has no complaints of pain or shortness of breath.

## 2018-01-16 NOTE — ED Notes (Signed)
PT taken to the toilet in room. Pt ambulated independently with a steady gait

## 2018-01-16 NOTE — ED Provider Notes (Signed)
[facility] Emergency Department Provider Note ____________________________________________   First MD Initiated Contact with Patient 01/16/18 [phone]     (approximate)  I have reviewed the triage vital signs and the nursing notes.   HISTORY  Chief Complaint Hypertension  HPI Tiffany Grimes is a 84 y.o. female presents for evaluation of high blood pressure  Patient reports she checked her blood pressure at home this morning after taking her normal morning medicine which includes lisinopril and atenolol, and her blood pressure was 190 systolic.  She denies any symptoms.  Reports this is happened many times and she seen her doctors for this, she denies any headache, no chest pain no trouble breathing.  No changes in vision.  Reports she feels fine but checks her blood pressure frequently, and she becomes concerned when it gets high, especially if it is over 180.  She reports she is had multiple episodes with high blood pressure, and she is working with her cardiologist and primary care doctor to try to get her blood pressure improved, but she is also a little unsure what changes they made her blood pressure recently    Past Medical History:  Diagnosis Date  . Atrial fibrillation (HCC)   . Atrial fibrillation (HCC)   . Barrett esophagus    due for EGD July 2012, [place]  . Dysrhythmia   . Hx of thyroid nodule July 2012   benign biopsy   . Hypertension   . Hypothyroidism   . Lentigo   . Reflux   . Thyroid disease   . TIA (transient ischemic attack) June 2012  . TIA (transient ischemic attack) June 2012  . Wears dentures    partial upper  . Wears hearing aid     Patient Active Problem List   Diagnosis Date Noted  . Carotid artery stenosis 02/28/2017  . Underweight 07/30/2015  . Furrowed tongue 07/30/2015  . Centrilobular emphysema (HCC) 04/18/2014  . Alpha-1-antitrypsin deficiency carrier (HCC) 04/18/2014  . Unspecified hypothyroidism 04/07/2014    . Multiple pulmonary nodules 10/05/2013  . Pulmonary nodules/lesions, multiple 08/31/2013  . PAD (peripheral artery disease) (HCC) 08/08/2013  . External hemorrhoid 08/02/2013  . Anal fissure 08/02/2013  . Ecchymosis 05/10/2013  . Anxiety state 10/28/2012  . Routine general medical examination at a health care facility 06/15/2012  . 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 Osteoporosis, post-menopausal 02/11/2012  . Lumbago with sciatica of right side 02/11/2012  . Posterior neck pain 02/11/2012  . Hyperlipidemia LDL goal <100 12/16/2011  . Hx of thyroid nodule   . Atrial fibrillation, new onset (HCC) 08/02/2011  . Chest pain, atypical 08/02/2011  . TIA (transient ischemic attack)   . Barrett esophagus   . Hypertension 03/29/2011    Past Surgical History:  Procedure Laterality Date  . ABDOMINAL HYSTERECTOMY    . CATARACT EXTRACTION W/PHACO Right 07/29/2016   Procedure: CATARACT EXTRACTION PHACO AND INTRAOCULAR LENS PLACEMENT (IOC);  Surgeon: Sherald Hess, MD;  Location: [facility];  Service: Ophthalmology;  Laterality: Right;  right  . CATARACT EXTRACTION W/PHACO Left 07/02/2017   Procedure: CATARACT EXTRACTION PHACO AND INTRAOCULAR LENS PLACEMENT (IOC);  Surgeon: Nevada Crane, MD;  Location: [facility] ORS;  Service: Ophthalmology;  Laterality: Left;  [place] 00:50.9 AP% 14.8 CDE 7.54 Fluid Pack Lot # [id number]  . CESAREAN SECTION    . HERNIA REPAIR  2008   left inguinal  . THYROID SURGERY  june 18   nodule removed    Prior to Admission  medications   Medication Sig Start Date End Date Taking? Authorizing Provider  amLODipine (NORVASC) 5 MG tablet TAKE 1 TABLET BY MOUTH DAILY. Patient taking differently: Take 2 tablets (10MG ) by mouth daily 06/11/16  Yes Sherlene Shams, MD  atenolol (TENORMIN) 25 MG tablet TAKE 1 TABLET TWICE A DAY 12/21/15  Yes Sherlene Shams, MD  atorvastatin (LIPITOR) 20 MG tablet TAKE 1 TABLET DAILY 07/14/15  Yes Sherlene Shams, MD   esomeprazole (NEXIUM) 40 MG capsule TAKE 1 CAPSULE DAILY BEFORE BREAKFAST 04/01/16  Yes Sherlene Shams, MD  latanoprost (XALATAN) 0.005 % ophthalmic solution Place 1 drop into both eyes at bedtime.    Yes [provider]  levothyroxine (SYNTHROID, LEVOTHROID) 75 MCG tablet Take 75 mcg daily before breakfast by mouth.   Yes [provider]  lisinopril (PRINIVIL,ZESTRIL) 20 MG tablet Take 20 mg by mouth 2 (two) times daily.   Yes [provider]  XARELTO 15 MG TABS tablet TAKE 1 TABLET DAILY 11/06/15  Yes Sherlene Shams, MD  cloNIDine (CATAPRES) 0.1 MG tablet Take 0.1 mg by mouth daily as needed. PRN for blood pressure (SBP >160 DBP >90) 12/17/17   [provider]  fluticasone (FLONASE) 50 MCG/ACT nasal spray Place 2 sprays into both nostrils daily as needed. 2 sprays into each nostril daily 07/28/14   Sherlene Shams, MD  sucralfate (CARAFATE) 1 g tablet Take 1 tablet (1 g total) by mouth 2 (two) times daily. Patient not taking: Reported on 01/03/2018 08/28/15   Sherlene Shams, MD    Allergies Neomycin  Family History  Problem Relation Age of Onset  . Hypertension Mother   . Stroke Mother   . Heart disease Father 80       AMI  . Hypertension Father   . Vision loss Paternal Aunt   . Cancer Neg Hx     Social History Social History   Tobacco Use  . Smoking status: Never Smoker  . Smokeless tobacco: Never Used  Substance Use Topics  . Alcohol use: No  . Drug use: No    Review of Systems Constitutional: No fever/chills no weakness Eyes: No visual changes. Cardiovascular: Denies chest pain. Respiratory: Denies shortness of breath. Gastrointestinal: No abdominal pain.   Musculoskeletal: Negative for back pain. Skin: Negative for rash. Neurological: Negative for headaches, focal weakness or numbness.    ____________________________________________   PHYSICAL EXAM:  VITAL SIGNS: ED Triage Vitals [01/16/18 0851]  Enc Vitals Group     BP  (!) 199/112     Pulse Rate 77     Resp 18     Temp 97.8 F (36.6 C)     Temp Source Oral     SpO2 95 %     Weight 115 lb (52.2 kg)     Height 5\' 6"  (1.676 m)     Head Circumference      Peak Flow      Pain Score 0     Pain Loc      Pain Edu?      Excl. in GC?     Constitutional: Alert and oriented. Well appearing and in no acute distress.  Blood pressure approximately 199/112 the time of my evaluation.  She is alert, does not appear anxious. Eyes: Conjunctivae are normal. Head: Atraumatic. Nose: No congestion/rhinnorhea. Mouth/Throat: Mucous membranes are moist. Neck: No stridor.   Cardiovascular: Normal rate, regular rhythm. Grossly normal heart sounds.  Good peripheral circulation. Respiratory: Normal respiratory effort.  No  retractions. Lungs CTAB. Gastrointestinal: Soft and nontender. No distention. Musculoskeletal: No lower extremity tenderness nor edema. Neurologic:  Normal speech and language. No gross focal neurologic deficits are appreciated.  Skin:  Skin is warm, dry and intact. No rash noted. Psychiatric: Mood and affect are normal. Speech and behavior are normal.  ____________________________________________   LABS (all labs ordered are listed, but only abnormal results are displayed)  Labs Reviewed  BASIC METABOLIC PANEL - Abnormal; Notable for the following components:      Result Value   Glucose, Bld 135 (*)    Calcium 8.8 (*)    All other components within normal limits  CBC   ____________________________________________  EKG  Reviewed and entered by me at 9:15 AM Heart rate 80 QRS 99 QTc 460 Atrial fibrillation, rate controlled.  No evidence of ischemia ____________________________________________  RADIOLOGY  Patient denies any headache, no focal neurologic symptoms, reassuring clinical exam.  No pulmonary symptoms.  No evidence of edema.  No indication for imaging denoted at this  time. ____________________________________________   PROCEDURES  Procedure(s) performed: None  Procedures  Critical Care performed: No  ____________________________________________   INITIAL IMPRESSION / ASSESSMENT AND PLAN / ED COURSE  Pertinent labs & imaging results that were available during my care of the patient were reviewed by me and considered in my medical decision making (see chart for details).  Patient returns for evaluation of hypertension, appears asymptomatic.  Multiple presentations in the past and seen by primary care with medication changes recently, does not appear that she is currently taking her previously prescribed Norvasc and I suspect there might be a little confusion in her medication regimen.  She is however taking lisinopril, atenolol, and has prescription for as needed clonidine, she has not taken that today and I will provide her a as needed clonidine tab and monitor for improvement in her symptoms.  No signs or symptoms of hypertensive emergency.      ----------------------------------------- 12:33 PM on 01/16/2018 -----------------------------------------  Lab work reassuring.  After clonidine blood pressure improved now 166/92.  No signs or symptoms of hypertensive emergency.  Discussed with patient we will follow-up closely with both cardiology and primary doctor.  Return precautions and treatment recommendations and follow-up discussed with the patient who is agreeable with the plan.   ____________________________________________   FINAL CLINICAL IMPRESSION(S) / ED DIAGNOSES  Final diagnoses:  Hypertensive urgency      NEW MEDICATIONS STARTED DURING THIS VISIT:  New Prescriptions   No medications on file     Note:  This document was prepared using Dragon voice recognition software and may include unintentional dictation errors.     Sharyn Creamer, MD 01/16/18 1233

## 2018-01-16 NOTE — Discharge Instructions (Signed)
As we discussed, though you do have high blood pressure (hypertension), fortunately it is not immediately dangerous at this time and does not need emergency intervention or admission to the hospital.  If we add to or change your regular medications, we may cause more harm than good - it is more appropriate for your primary care doctor to evaluate you in clinic and decide if any medication changes are needed.  Please follow up in clinic as recommended in these papers. ° °Return to the Emergency Department (ED) if you experience any chest pain/pressure/tightness, difficulty breathing, or sudden sweating, or other symptoms that concern you. ° °

## 2018-01-18 ENCOUNTER — Other Ambulatory Visit: Payer: Self-pay

## 2018-01-18 ENCOUNTER — Emergency Department
Admission: EM | Admit: 2018-01-18 | Discharge: 2018-01-18 | Disposition: A | Discharge status: Home / Self Care | Payer: Medicare Other | Attending: Emergency Medicine | Admitting: Emergency Medicine

## 2018-01-18 DIAGNOSIS — Z79899 Other long term (current) drug therapy: Secondary | ICD-10-CM | POA: Diagnosis not present

## 2018-01-18 DIAGNOSIS — I10 Essential (primary) hypertension: Secondary | ICD-10-CM | POA: Diagnosis not present

## 2018-01-18 DIAGNOSIS — R030 Elevated blood-pressure reading, without diagnosis of hypertension: Secondary | ICD-10-CM | POA: Diagnosis present

## 2018-01-18 DIAGNOSIS — E039 Hypothyroidism, unspecified: Secondary | ICD-10-CM | POA: Diagnosis not present

## 2018-01-18 NOTE — ED Triage Notes (Signed)
Pt arrives to ED c/o of HTN. Has HTN, takes meds. 195/115, 174/82. In triage 101/86. Took meds (clonidine) this AM. Alert, oriented, ambulatory. Denies HA, blurred vision, dizziness. Alert, oriented, ambulatory.

## 2018-01-18 NOTE — Discharge Instructions (Addendum)
Take medication as prescribed by your cardiologist and primary care provider.

## 2018-01-18 NOTE — ED Provider Notes (Signed)
[facility] Emergency Department Provider Note  ___________________________________________   First MD Initiated Contact with Patient 01/18/18 0820     (approximate)  I have reviewed the triage vital signs and the nursing notes.   HISTORY  Chief Complaint Hypertension   HPI Tiffany Grimes is a 84 y.o. female is here with complaint of hypertension.  Patient states that she has a history of hypertension and takes medication.  She also has a prescription for clonidine that she is to take "as needed".  This morning she took her blood pressure and she reports that it was 195/115.  She has taken her clonidine prior to arrival in the ED.  In triage her blood pressure was 101/86.  Patient at that time was ready to go home.  Apparently she was in the ED yesterday for the same complaint.  She did not stay for the [employer] to reconcile her medications.  Patient believes that she has her medications mixed up.   Past Medical History:  Diagnosis Date  . Atrial fibrillation (HCC)   . Atrial fibrillation (HCC)   . Barrett esophagus    due for EGD July 2012, [place]  . Dysrhythmia   . Hx of thyroid nodule July 2012   benign biopsy   . Hypertension   . Hypothyroidism   . Lentigo   . Reflux   . Thyroid disease   . TIA (transient ischemic attack) June 2012  . TIA (transient ischemic attack) June 2012  . Wears dentures    partial upper  . Wears hearing aid     Patient Active Problem List   Diagnosis Date Noted  . Carotid artery stenosis 02/28/2017  . Underweight 07/30/2015  . Furrowed tongue 07/30/2015  . Centrilobular emphysema (HCC) 04/18/2014  . Alpha-1-antitrypsin deficiency carrier (HCC) 04/18/2014  . Unspecified hypothyroidism 04/07/2014  . Multiple pulmonary nodules 10/05/2013  . Pulmonary nodules/lesions, multiple 08/31/2013  . PAD (peripheral artery disease) (HCC) 08/08/2013  . External hemorrhoid 08/02/2013  . Anal fissure 08/02/2013  . Ecchymosis  05/10/2013  . Anxiety state 10/28/2012  . Routine general medical examination at a health care facility 06/15/2012  . 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 Osteoporosis, post-menopausal 02/11/2012  . Lumbago with sciatica of right side 02/11/2012  . Posterior neck pain 02/11/2012  . Hyperlipidemia LDL goal <100 12/16/2011  . Hx of thyroid nodule   . Atrial fibrillation, new onset (HCC) 08/02/2011  . Chest pain, atypical 08/02/2011  . TIA (transient ischemic attack)   . Barrett esophagus   . Hypertension 03/29/2011    Past Surgical History:  Procedure Laterality Date  . ABDOMINAL HYSTERECTOMY    . CATARACT EXTRACTION W/PHACO Right 07/29/2016   Procedure: CATARACT EXTRACTION PHACO AND INTRAOCULAR LENS PLACEMENT (IOC);  Surgeon: Sherald Hess, MD;  Location[facility];  Service: Ophthalmology;  Laterality: Right;  right  . CATARACT EXTRACTION W/PHACO Left 07/02/2017   Procedure: CATARACT EXTRACTION PHACO AND INTRAOCULAR LENS PLACEMENT (IOC);  Surgeon: Nevada Crane, MD;  Location: [facility] OR[facility];  Service: Ophthalmology;  Laterality: Left;  [place] 00:50.9 AP% 14.8 CDE 7.54 Fluid Pack Lot # [id number]  . CESAREAN SECTION    . HERNIA REPAIR  2008   left inguinal  . THYROID SURGERY  june 18   nodule removed    Prior to Admission medications   Medication Sig Start Date End Date Taking? Authorizing Provider  amLODipine (NORVASC) 2.5 MG tablet Take 2.5 mg by mouth daily.   Yes [provider]  azelastine (ASTELIN) 0.1 % nasal spray Place 1 spray into both nostrils 2 (two) times daily. Use in each nostril as directed   Yes [provider]  brimonidine (ALPHAGAN) 0.15 % ophthalmic solution 1 drop 3 (three) times daily.   Yes [provider]  amLODipine (NORVASC) 5 MG tablet TAKE 1 TABLET BY MOUTH DAILY. Patient taking differently: Take 2 tablets (10MG ) by mouth daily 06/11/16   Sherlene Shams, MD  atenolol (TENORMIN) 25 MG tablet TAKE 1  TABLET TWICE A DAY 12/21/15   Sherlene Shams, MD  atorvastatin (LIPITOR) 20 MG tablet TAKE 1 TABLET DAILY 07/14/15   Sherlene Shams, MD  cloNIDine (CATAPRES) 0.1 MG tablet Take 0.1 mg by mouth daily as needed. PRN for blood pressure (SBP >160 DBP >90) 12/17/17   [provider]  esomeprazole (NEXIUM) 40 MG capsule TAKE 1 CAPSULE DAILY BEFORE BREAKFAST 04/01/16   Sherlene Shams, MD  fluticasone (FLONASE) 50 MCG/ACT nasal spray Place 2 sprays into both nostrils daily as needed. 2 sprays into each nostril daily 07/28/14   Sherlene Shams, MD  latanoprost (XALATAN) 0.005 % ophthalmic solution Place 1 drop into both eyes at bedtime.     [provider]  levothyroxine (SYNTHROID, LEVOTHROID) 75 MCG tablet Take 75 mcg daily before breakfast by mouth.    [provider]  lisinopril (PRINIVIL,ZESTRIL) 20 MG tablet Take 20 mg by mouth 2 (two) times daily.    [provider]  sucralfate (CARAFATE) 1 g tablet Take 1 tablet (1 g total) by mouth 2 (two) times daily. Patient not taking: Reported on 01/03/2018 08/28/15   Sherlene Shams, MD  XARELTO 15 MG TABS tablet TAKE 1 TABLET DAILY 11/06/15   Sherlene Shams, MD    Allergies Neomycin  Family History  Problem Relation Age of Onset  . Hypertension Mother   . Stroke Mother   . Heart disease Father 24       AMI  . Hypertension Father   . Vision loss Paternal Aunt   . Cancer Neg Hx     Social History Social History   Tobacco Use  . Smoking status: Never Smoker  . Smokeless tobacco: Never Used  Substance Use Topics  . Alcohol use: No  . Drug use: No    Review of Systems Constitutional: No fever/chills Eyes: No visual changes. ENT: No complaints. Cardiovascular: Denies chest pain. Respiratory: Denies shortness of breath. Gastrointestinal: No abdominal pain.  No nausea, no vomiting.   Musculoskeletal: Negative for back pain. Skin: Negative for rash. Neurological: Negative for headaches, focal weakness or  numbness. ____________________________________________   PHYSICAL EXAM:  VITAL SIGNS: ED Triage Vitals  Enc Vitals Group     BP 01/18/18 0811 101/86     Pulse Rate 01/18/18 0811 63     Resp 01/18/18 0811 18     Temp 01/18/18 0811 98.7 F (37.1 C)     Temp Source 01/18/18 0811 Oral     SpO2 01/18/18 0811 96 %     Weight 01/18/18 0812 115 lb (52.2 kg)     Height 01/18/18 0812 5\' 6"  (1.676 m)     Head Circumference --      Peak Flow --      Pain Score 01/18/18 0812 0     Pain Loc --      Pain Edu? --      Excl. in GC? --    Constitutional: Alert and oriented. Well appearing and in  no acute distress. Eyes: Conjunctivae are normal.  Head: Atraumatic. Nose: No congestion/rhinnorhea. Mouth/Throat: Mucous membranes are moist.  Oropharynx non-erythematous. Neck: No stridor.   Cardiovascular: Normal rate, regular rhythm. Grossly normal heart sounds.  Good peripheral circulation. Respiratory: Normal respiratory effort.  No retractions. Lungs CTAB. Gastrointestinal: Soft and nontender. No distention.  Musculoskeletal: Moves upper and lower extremities without any difficulty and normal gait was noted while in the exam room. Neurologic:  Normal speech and language. No gross focal neurologic deficits are appreciated. No gait instability. Skin:  Skin is warm, dry and intact. No rash noted. Psychiatric: Mood and affect are normal. Speech and behavior are normal.  ____________________________________________   LABS (all labs ordered are listed, but only abnormal results are displayed)  Labs Reviewed - No data to display  PROCEDURES  Procedure(s) performed: None  Procedures  Critical Care performed: No  ____________________________________________   INITIAL IMPRESSION / ASSESSMENT AND PLAN / ED COURSE  As part of my medical decision making, I reviewed the following data within the electronic MEDICAL RECORD NUMBER Notes from prior ED visits and [place] Controlled Substance  Database  Patient's medications were updated in ED epic after looking at her medication list from Dr. Ewell Poe office and also Dr. Philemon Kingdom office.  This was compared with patient's list as she has it broken down to morning, afternoon and evening pills.  Patient was reassured that her blood pressure at this time is well controlled.  We discussed clonidine and how this medication ask which is different from her other antihypertensive medications.  ____________________________________________   FINAL CLINICAL IMPRESSION(S) / ED DIAGNOSES  Final diagnoses:  Hypertension, unspecified type     ED Discharge Orders    None       Note:  This document was prepared using Dragon voice recognition software and may include unintentional dictation errors.    Tommi Rumps, PA-C 01/18/18 1005    Sharyn Creamer, MD 01/18/18 [phone]

## 2018-01-24 ENCOUNTER — Other Ambulatory Visit: Payer: Self-pay

## 2018-01-24 ENCOUNTER — Emergency Department
Admission: EM | Admit: 2018-01-24 | Discharge: 2018-01-24 | Disposition: A | Discharge status: Home / Self Care | Payer: Medicare Other | Attending: Emergency Medicine | Admitting: Emergency Medicine

## 2018-01-24 DIAGNOSIS — E039 Hypothyroidism, unspecified: Secondary | ICD-10-CM | POA: Diagnosis not present

## 2018-01-24 DIAGNOSIS — Z8673 Personal history of transient ischemic attack (TIA), and cerebral infarction without residual deficits: Secondary | ICD-10-CM | POA: Insufficient documentation

## 2018-01-24 DIAGNOSIS — F419 Anxiety disorder, unspecified: Secondary | ICD-10-CM | POA: Diagnosis not present

## 2018-01-24 DIAGNOSIS — Z79899 Other long term (current) drug therapy: Secondary | ICD-10-CM | POA: Insufficient documentation

## 2018-01-24 DIAGNOSIS — I10 Essential (primary) hypertension: Secondary | ICD-10-CM | POA: Diagnosis not present

## 2018-01-24 DIAGNOSIS — R030 Elevated blood-pressure reading, without diagnosis of hypertension: Secondary | ICD-10-CM

## 2018-01-24 NOTE — ED Notes (Signed)
Spoke with Dr Derrill Kay regarding patient, no orders at this time.

## 2018-01-24 NOTE — ED Notes (Signed)
Pt presents with c/o hypertension; 171/98 at home; denies chest pain, headache and dizziness; declined offer of wheelchair; ambulatory with steady gait

## 2018-01-24 NOTE — ED Notes (Signed)
ED Provider at bedside. 

## 2018-01-24 NOTE — ED Triage Notes (Signed)
Reports always takes blood pressure before bed and tonight it was 171/98.  Patient denies any accompanying symptoms - dizziness, headache or chest pain.

## 2018-01-24 NOTE — ED Provider Notes (Signed)
[facility] Emergency Department Provider Note       Time seen: ----------------------------------------- 9:43 PM on 01/24/2018 -----------------------------------------   I have reviewed the triage vital signs and the nursing notes.  HISTORY   Chief Complaint Hypertension    HPI Tiffany Grimes is a 84 y.o. female with a history of atrial fibrillation, hypertension, hypothyroidism, TIA who presents to the ED for retention.  Patient checked her blood pressure tonight before she went to bed and it was noted to be 171/98.  She denies any chest pain, headache or dizziness.  Patient been seen recently for similar, was given clonidine to take as needed for hypertension.  She denies any recent illness or other complaints.  Past Medical History:  Diagnosis Date  . Atrial fibrillation (HCC)   . Atrial fibrillation (HCC)   . Barrett esophagus    due for EGD July 2012, [place]  . Dysrhythmia   . Hx of thyroid nodule July 2012   benign biopsy   . Hypertension   . Hypothyroidism   . Lentigo   . Reflux   . Thyroid disease   . TIA (transient ischemic attack) June 2012  . TIA (transient ischemic attack) June 2012  . Wears dentures    partial upper  . Wears hearing aid     Patient Active Problem List   Diagnosis Date Noted  . Carotid artery stenosis 02/28/2017  . Underweight 07/30/2015  . Furrowed tongue 07/30/2015  . Centrilobular emphysema (HCC) 04/18/2014  . Alpha-1-antitrypsin deficiency carrier (HCC) 04/18/2014  . Unspecified hypothyroidism 04/07/2014  . Multiple pulmonary nodules 10/05/2013  . Pulmonary nodules/lesions, multiple 08/31/2013  . PAD (peripheral artery disease) (HCC) 08/08/2013  . External hemorrhoid 08/02/2013  . Anal fissure 08/02/2013  . Ecchymosis 05/10/2013  . Anxiety state 10/28/2012  . Routine general medical examination at a health care facility 06/15/2012  . 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 Osteoporosis,  post-menopausal 02/11/2012  . Lumbago with sciatica of right side 02/11/2012  . Posterior neck pain 02/11/2012  . Hyperlipidemia LDL goal <100 12/16/2011  . Hx of thyroid nodule   . Atrial fibrillation, new onset (HCC) 08/02/2011  . Chest pain, atypical 08/02/2011  . TIA (transient ischemic attack)   . Barrett esophagus   . Hypertension 03/29/2011    Past Surgical History:  Procedure Laterality Date  . ABDOMINAL HYSTERECTOMY    . CATARACT EXTRACTION W/PHACO Right 07/29/2016   Procedure: CATARACT EXTRACTION PHACO AND INTRAOCULAR LENS PLACEMENT (IOC);  Surgeon: Sherald Hess, MD;  Location: [facility];  Service: Ophthalmology;  Laterality: Right;  right  . CATARACT EXTRACTION W/PHACO Left 07/02/2017   Procedure: CATARACT EXTRACTION PHACO AND INTRAOCULAR LENS PLACEMENT (IOC);  Surgeon: Nevada Crane, MD;  Location: [facility];  Service: Ophthalmology;  Laterality: Left;  [place] 00:50.9 AP% 14.8 CDE 7.54 Fluid Pack Lot # [id number]  . CESAREAN SECTION    . HERNIA REPAIR  2008   left inguinal  . THYROID SURGERY  june 18   nodule removed    Allergies Neomycin  Social History Social History   Tobacco Use  . Smoking status: Never Smoker  . Smokeless tobacco: Never Used  Substance Use Topics  . Alcohol use: No  . Drug use: No   Review of Systems Constitutional: Negative for fever. Cardiovascular: Negative for chest pain. Respiratory: Negative for shortness of breath. Gastrointestinal: Negative for abdominal pain, vomiting and diarrhea. Musculoskeletal: Negative for back pain. Skin: Negative for rash. Neurological: Negative for headaches, focal  weakness or numbness.  All systems negative/normal/unremarkable except as stated in the HPI  ____________________________________________   PHYSICAL EXAM:  VITAL SIGNS: ED Triage Vitals  Enc Vitals Group     BP 01/24/18 2102 (!) 190/92     Pulse Rate 01/24/18 2102 86     Resp 01/24/18 2102 16     Temp  01/24/18 2102 97.9 F (36.6 C)     Temp Source 01/24/18 2102 Oral     SpO2 01/24/18 2102 97 %     Weight --      Height --      Head Circumference --      Peak Flow --      Pain Score 01/24/18 2104 0     Pain Loc --      Pain Edu? --      Excl. in [place]? --    Constitutional: Alert and oriented.  Anxious, no distress Eyes: Conjunctivae are normal. Normal extraocular movements. ENT   Head: Normocephalic and atraumatic.   Nose: No congestion/rhinnorhea.   Mouth/Throat: Mucous membranes are moist.   Neck: No stridor. Cardiovascular: Normal rate, regular rhythm. No murmurs, rubs, or gallops. Respiratory: Normal respiratory effort without tachypnea nor retractions. Breath sounds are clear and equal bilaterally. No wheezes/rales/rhonchi. Gastrointestinal: Soft and nontender. Normal bowel sounds Musculoskeletal: Nontender with normal range of motion in extremities. No lower extremity tenderness nor edema. Neurologic:  Normal speech and language. No gross focal neurologic deficits are appreciated.  Skin:  Skin is warm, dry and intact. No rash noted. Psychiatric: Mood and affect are normal. Speech and behavior are normal.  ____________________________________________  ED COURSE:  As part of my medical decision making, I reviewed the following data within the electronic MEDICAL RECORD NUMBER History obtained from family if available, nursing notes, old chart and ekg, as well as notes from prior ED visits. Patient presented for hypertension but is asymptomatic and likely requires no further treatment.   Procedures  ____________________________________________  DIFFERENTIAL DIAGNOSIS   Asymptomatic hypertension, anxiety  FINAL ASSESSMENT AND PLAN  Asymptomatic hypertension   Plan: The patient had presented for mostly anxiety.  Patient has been really worried about her blood pressure, she checks it when she is ready anxious and finds it to be high.  This subsequently leads to  even higher blood pressures.  She does not require any further treatment or evaluation.  I advised she must stop any PRN clonidine and she must stop checking her blood pressure unless she is at her doctor's office.   Ulice Dash, MD   Note: This note was generated in part or whole with voice recognition software. Voice recognition is usually quite accurate but there are transcription errors that can and very often do occur. I apologize for any typographical errors that were not detected and corrected.     Emily Filbert, MD 01/24/18 2145

## 2018-02-10 DIAGNOSIS — E441 Mild protein-calorie malnutrition: Secondary | ICD-10-CM | POA: Insufficient documentation

## 2018-03-10 ENCOUNTER — Other Ambulatory Visit: Payer: Self-pay

## 2018-03-10 ENCOUNTER — Encounter: Payer: Self-pay | Admitting: Emergency Medicine

## 2018-03-10 ENCOUNTER — Emergency Department
Admission: EM | Admit: 2018-03-10 | Discharge: 2018-03-10 | Disposition: A | Discharge status: Home / Self Care | Payer: Medicare Other | Attending: Emergency Medicine | Admitting: Emergency Medicine

## 2018-03-10 DIAGNOSIS — E039 Hypothyroidism, unspecified: Secondary | ICD-10-CM | POA: Insufficient documentation

## 2018-03-10 DIAGNOSIS — R000 Tachycardia, unspecified: Secondary | ICD-10-CM | POA: Diagnosis present

## 2018-03-10 DIAGNOSIS — Z7901 Long term (current) use of anticoagulants: Secondary | ICD-10-CM | POA: Diagnosis not present

## 2018-03-10 DIAGNOSIS — Z79899 Other long term (current) drug therapy: Secondary | ICD-10-CM | POA: Insufficient documentation

## 2018-03-10 DIAGNOSIS — I10 Essential (primary) hypertension: Secondary | ICD-10-CM | POA: Diagnosis not present

## 2018-03-10 NOTE — ED Provider Notes (Signed)
[facility] Emergency Department Provider Note   ____________________________________________   First MD Initiated Contact with Patient 03/10/18 0310     (approximate)  I have reviewed the triage vital signs and the nursing notes.   HISTORY  Chief Complaint Hypertension    HPI Tiffany Grimes is a 84 y.o. female who comes into the hospital today with high blood pressure.  She reports that she did too much work today.  The patient states that she checked her blood pressure and her systolic was 867.  The patient states that her heart rate was 113.  When asked why she checked her pressure the patient states that she could feel that her blood pressure was elevated.  She states that she felt her heart pounding but denies any pain, shortness of breath, dizziness or lightheadedness.  The patient states that she had also gone downstairs to get her blood pressure kit and then walked back up the stairs which seemed to make it worse.  The patient states that she took a pill of clonidine.  She is unsure exactly what the dose was.  30 minutes later when her blood pressure still seemed elevated the patient took a second dose of clonidine and then came into the hospital.  The patient is concerned about a possible stroke.  The patient's mouth is dry but she feels well at this time.   Past Medical History:  Diagnosis Date  . Atrial fibrillation ([place])   . Atrial fibrillation ([place])   . Barrett esophagus    due for EGD July 2012, [place]  . Dysrhythmia   . Hx of thyroid nodule July 2012   benign biopsy   . Hypertension   . Hypothyroidism   . Lentigo   . Reflux   . Thyroid disease   . TIA (transient ischemic attack) June 2012  . TIA (transient ischemic attack) June 2012  . Wears dentures    partial upper  . Wears hearing aid     Patient Active Problem List   Diagnosis Date Noted  . Carotid artery stenosis 02/28/2017  . Underweight 07/30/2015  . Furrowed tongue  07/30/2015  . Centrilobular emphysema ([place]) 04/18/2014  . Alpha-1-antitrypsin deficiency carrier ([place]) 04/18/2014  . Unspecified hypothyroidism 04/07/2014  . Multiple pulmonary nodules 10/05/2013  . Pulmonary nodules/lesions, multiple 08/31/2013  . PAD (peripheral artery disease) ([place]) 08/08/2013  . External hemorrhoid 08/02/2013  . Anal fissure 08/02/2013  . Ecchymosis 05/10/2013  . Anxiety state 10/28/2012  . Routine general medical examination at a health care facility 06/15/2012  . 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 Osteoporosis, post-menopausal 02/11/2012  . Lumbago with sciatica of right side 02/11/2012  . Posterior neck pain 02/11/2012  . Hyperlipidemia LDL goal <100 12/16/2011  . Hx of thyroid nodule   . Atrial fibrillation, new onset ([place]) 08/02/2011  . Chest pain, atypical 08/02/2011  . TIA (transient ischemic attack)   . Barrett esophagus   . Hypertension 03/29/2011    Past Surgical History:  Procedure Laterality Date  . ABDOMINAL HYSTERECTOMY    . CATARACT EXTRACTION W/PHACO Right 07/29/2016   Procedure: CATARACT EXTRACTION PHACO AND INTRAOCULAR LENS PLACEMENT (IOC);  Surgeon: Ronnell Freshwater, MD;  Location: [place];  Service: Ophthalmology;  Laterality: Right;  right  . CATARACT EXTRACTION W/PHACO Left 07/02/2017   Procedure: CATARACT EXTRACTION PHACO AND INTRAOCULAR LENS PLACEMENT (IOC);  Surgeon: Eulogio Bear, MD;  Location: [facility];  Service: Ophthalmology;  Laterality: Left;  [place] 00:50.9 AP%  14.8 CDE 7.54 Fluid Pack Lot # [id number]  . CESAREAN SECTION    . HERNIA REPAIR  2008   left inguinal  . THYROID SURGERY  june 18   nodule removed    Prior to Admission medications   Medication Sig Start Date End Date Taking? Authorizing Provider  amLODipine (NORVASC) 2.5 MG tablet Take 2.5 mg by mouth daily.    [provider]  amLODipine (NORVASC) 5 MG tablet TAKE 1 TABLET BY MOUTH DAILY. Patient taking differently: Take  2 tablets ('10MG'$ ) by mouth daily 06/11/16   Crecencio Mc, MD  atenolol (TENORMIN) 25 MG tablet TAKE 1 TABLET TWICE A DAY 12/21/15   Crecencio Mc, MD  atorvastatin (LIPITOR) 20 MG tablet TAKE 1 TABLET DAILY 07/14/15   Crecencio Mc, MD  azelastine (ASTELIN) 0.1 % nasal spray Place 1 spray into both nostrils 2 (two) times daily. Use in each nostril as directed    [provider]  brimonidine (ALPHAGAN) 0.15 % ophthalmic solution 1 drop 3 (three) times daily.    [provider]  cloNIDine (CATAPRES) 0.1 MG tablet Take 0.1 mg by mouth daily as needed. PRN for blood pressure (SBP >160 DBP >90) 12/17/17   [provider]  esomeprazole (NEXIUM) 40 MG capsule TAKE 1 CAPSULE DAILY BEFORE BREAKFAST 04/01/16   Crecencio Mc, MD  fluticasone (FLONASE) 50 MCG/ACT nasal spray Place 2 sprays into both nostrils daily as needed. 2 sprays into each nostril daily 07/28/14   Crecencio Mc, MD  latanoprost (XALATAN) 0.005 % ophthalmic solution Place 1 drop into both eyes at bedtime.     [provider]  levothyroxine (SYNTHROID, LEVOTHROID) 75 MCG tablet Take 75 mcg daily before breakfast by mouth.    [provider]  lisinopril (PRINIVIL,ZESTRIL) 20 MG tablet Take 20 mg by mouth 2 (two) times daily.    [provider]  sucralfate (CARAFATE) 1 g tablet Take 1 tablet (1 g total) by mouth 2 (two) times daily. Patient not taking: Reported on 01/03/2018 08/28/15   Crecencio Mc, MD  XARELTO 15 MG TABS tablet TAKE 1 TABLET DAILY 11/06/15   Crecencio Mc, MD    Allergies Neomycin  Family History  Problem Relation Age of Onset  . Hypertension Mother   . Stroke Mother   . Heart disease Father 48       AMI  . Hypertension Father   . Vision loss Paternal Aunt   . Cancer Neg Hx     Social History Social History   Tobacco Use  . Smoking status: Never Smoker  . Smokeless tobacco: Never Used  Substance Use Topics  . Alcohol use: No  . Drug use: No     Review of Systems  Constitutional: No fever/chills Eyes: No visual changes. ENT: No sore throat. Cardiovascular: Denies chest pain. Respiratory: Denies shortness of breath. Gastrointestinal: No abdominal pain.  No nausea, no vomiting.  No diarrhea.  No constipation. Genitourinary: Negative for dysuria. Musculoskeletal: Negative for back pain. Skin: Negative for rash. Neurological: Negative for headaches, focal weakness or numbness.   ____________________________________________   PHYSICAL EXAM:  VITAL SIGNS: ED Triage Vitals  Enc Vitals Group     BP 03/10/18 0138 (!) 181/105     Pulse Rate 03/10/18 0138 90     Resp 03/10/18 0138 16     Temp 03/10/18 0138 (!) 97.5 F (36.4 C)     Temp Source 03/10/18 0138 Oral     SpO2 03/10/18 0138  96 %     Weight 03/10/18 0139 110 lb (49.9 kg)     Height 03/10/18 0139 '5\' 6"'$  (1.676 m)     Head Circumference --      Peak Flow --      Pain Score 03/10/18 0139 0     Pain Loc --      Pain Edu? --      Excl. in [place]? --     Constitutional: Alert and oriented. Well appearing and in no acute distress. Eyes: Conjunctivae are normal. PERRL. EOMI. Head: Atraumatic. Nose: No congestion/rhinnorhea. Mouth/Throat: Mucous membranes are moist.  Oropharynx non-erythematous. Cardiovascular: Normal rate, regular rhythm. Grossly normal heart sounds.  Good peripheral circulation. Respiratory: Normal respiratory effort.  No retractions. Lungs CTAB. Gastrointestinal: Soft and nontender. No distention. Positive bowel sounds Musculoskeletal: No lower extremity tenderness nor edema.   Neurologic:  Normal speech and language.  Skin:  Skin is warm, dry and intact.  Psychiatric: Mood and affect are normal.   ____________________________________________   LABS (all labs ordered are listed, but only abnormal results are displayed)  Labs Reviewed - No data to  display ____________________________________________  EKG  none ____________________________________________  RADIOLOGY  ED MD interpretation:  none  Official radiology report(s): No results found.  ____________________________________________   PROCEDURES  Procedure(s) performed: None  Procedures  Critical Care performed: No  ____________________________________________   INITIAL IMPRESSION / ASSESSMENT AND PLAN / ED COURSE  As part of my medical decision making, I reviewed the following data within the electronic MEDICAL RECORD NUMBER Notes from prior ED visits and [place] Controlled Substance Database   This is an 84 year old female who comes into the hospital today with some elevated blood pressure.  The patient states that she overdid it and felt her heart pounding tonight.  She did not have any pain or any shortness of breath or any other symptoms.  The patient took 2 clonidine and by the time I went to evaluate the patient her blood pressure was in the 80s.  She was resting comfortably in no acute distress but I feel that the patient may have taken too much medication which dropped her pressure very low.  As the patient did not have any chest pain, headache, dizziness, nausea, vomiting I did not check any blood work.  The patient does have a history of high blood pressure may need her medications adjusted.  I did monitor the patient until her blood pressure improved to 144/75.  She will be discharged to follow-up with her cardiologist for better blood pressure management.      ____________________________________________   FINAL CLINICAL IMPRESSION(S) / ED DIAGNOSES  Final diagnoses:  Hypertension, unspecified type     ED Discharge Orders    None       Note:  This document was prepared using Dragon voice recognition software and may include unintentional dictation errors.    Loney Hering, MD 03/10/18 [phone]

## 2018-03-10 NOTE — Discharge Instructions (Addendum)
Please follow-up with your cardiologist for further evaluation

## 2018-03-10 NOTE — ED Notes (Addendum)
Pt states that she has HTN and when she took her pressure at home it was 214 systolic. Pt alert and oriented x 4. Family at bedside. Pt states that she took a Clonidine 2 pills about 30 minutes before coming to the ED.

## 2018-03-10 NOTE — ED Triage Notes (Signed)
Pt arrived tom the ED accompanied by her husband for complaints of hypertension. Pt reports that she took some medication to bring her BP down but is not working and she is afraid of having a stroke. Pt is AOx4 in no apparent distress.

## 2018-05-06 DIAGNOSIS — N183 Chronic kidney disease, stage 3 (moderate): Secondary | ICD-10-CM

## 2018-05-06 DIAGNOSIS — E1122 Type 2 diabetes mellitus with diabetic chronic kidney disease: Secondary | ICD-10-CM | POA: Insufficient documentation

## 2018-05-30 ENCOUNTER — Encounter: Payer: Self-pay | Admitting: Medical Oncology

## 2018-05-30 ENCOUNTER — Emergency Department
Admission: EM | Admit: 2018-05-30 | Discharge: 2018-05-30 | Disposition: A | Discharge status: Home / Self Care | Payer: Medicare Other | Attending: Emergency Medicine | Admitting: Emergency Medicine

## 2018-05-30 DIAGNOSIS — E039 Hypothyroidism, unspecified: Secondary | ICD-10-CM | POA: Diagnosis not present

## 2018-05-30 DIAGNOSIS — I10 Essential (primary) hypertension: Secondary | ICD-10-CM

## 2018-05-30 DIAGNOSIS — Z79899 Other long term (current) drug therapy: Secondary | ICD-10-CM | POA: Diagnosis not present

## 2018-05-30 DIAGNOSIS — Z7901 Long term (current) use of anticoagulants: Secondary | ICD-10-CM | POA: Insufficient documentation

## 2018-05-30 DIAGNOSIS — Z8673 Personal history of transient ischemic attack (TIA), and cerebral infarction without residual deficits: Secondary | ICD-10-CM | POA: Insufficient documentation

## 2018-05-30 NOTE — ED Provider Notes (Signed)
[facility] Emergency Department Provider Note   ____________________________________________   First MD Initiated Contact with Patient 05/30/18 [phone]     (approximate)  I have reviewed the triage vital signs and the nursing notes.   HISTORY  Chief Complaint Hypertension    HPI Tiffany Grimes is a 84 y.o. female patient presents emergency room concern for elevated blood pressure.  Patient states she takes her blood pressure medicine every 12 hours.  Patient states she was concerned because she woke this morning her blood pressure was 170/97.  Patient was told to come to the ER if her blood pressures ever becomes elevated while medication.  Patient states she took her morning medication and by the time she got to the ED she felt better.  Patient denies headache, vision disturbance, or vertigo.  Patient denies chest or abdominal pain.  Past Medical History:  Diagnosis Date  . Atrial fibrillation (HCC)   . Atrial fibrillation (HCC)   . Barrett esophagus    due for EGD July 2012, [place]  . Dysrhythmia   . Hx of thyroid nodule July 2012   benign biopsy   . Hypertension   . Hypothyroidism   . Lentigo   . Reflux   . Thyroid disease   . TIA (transient ischemic attack) June 2012  . TIA (transient ischemic attack) June 2012  . Wears dentures    partial upper  . Wears hearing aid     Patient Active Problem List   Diagnosis Date Noted  . Carotid artery stenosis 02/28/2017  . Underweight 07/30/2015  . Furrowed tongue 07/30/2015  . Centrilobular emphysema (HCC) 04/18/2014  . Alpha-1-antitrypsin deficiency carrier 04/18/2014  . Unspecified hypothyroidism 04/07/2014  . Multiple pulmonary nodules 10/05/2013  . Pulmonary nodules/lesions, multiple 08/31/2013  . PAD (peripheral artery disease) (HCC) 08/08/2013  . External hemorrhoid 08/02/2013  . Anal fissure 08/02/2013  . Ecchymosis 05/10/2013  . Anxiety state 10/28/2012  . Routine general medical  examination at a health care facility 06/15/2012  . 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 Osteoporosis, post-menopausal 02/11/2012  . Lumbago with sciatica of right side 02/11/2012  . Posterior neck pain 02/11/2012  . Hyperlipidemia LDL goal <100 12/16/2011  . Hx of thyroid nodule   . Atrial fibrillation, new onset (HCC) 08/02/2011  . Chest pain, atypical 08/02/2011  . TIA (transient ischemic attack)   . Barrett esophagus   . Hypertension 03/29/2011    Past Surgical History:  Procedure Laterality Date  . ABDOMINAL HYSTERECTOMY    . CATARACT EXTRACTION W/PHACO Right 07/29/2016   Procedure: CATARACT EXTRACTION PHACO AND INTRAOCULAR LENS PLACEMENT (IOC);  Surgeon: Sherald Hess, MD;  Location: [facility];  Service: Ophthalmology;  Laterality: Right;  right  . CATARACT EXTRACTION W/PHACO Left 07/02/2017   Procedure: CATARACT EXTRACTION PHACO AND INTRAOCULAR LENS PLACEMENT (IOC);  Surgeon: Nevada Crane, MD;  Location: [facility];  Service: Ophthalmology;  Laterality: Left;  [place] 00:50.9 AP% 14.8 CDE 7.54 Fluid Pack Lot # [id number]  . CESAREAN SECTION    . HERNIA REPAIR  2008   left inguinal  . THYROID SURGERY  june 18   nodule removed    Prior to Admission medications   Medication Sig Start Date End Date Taking? Authorizing Provider  amLODipine (NORVASC) 2.5 MG tablet Take 2.5 mg by mouth daily.    [provider]  amLODipine (NORVASC) 5 MG tablet TAKE 1 TABLET BY MOUTH DAILY. Patient taking differently: Take 2 tablets (10MG ) by mouth  daily 06/11/16   Sherlene Shams, MD  atenolol (TENORMIN) 25 MG tablet TAKE 1 TABLET TWICE A DAY 12/21/15   Sherlene Shams, MD  atorvastatin (LIPITOR) 20 MG tablet TAKE 1 TABLET DAILY 07/14/15   Sherlene Shams, MD  azelastine (ASTELIN) 0.1 % nasal spray Place 1 spray into both nostrils 2 (two) times daily. Use in each nostril as directed    [provider]  brimonidine (ALPHAGAN) 0.15 % ophthalmic  solution 1 drop 3 (three) times daily.    [provider]  cloNIDine (CATAPRES) 0.1 MG tablet Take 0.1 mg by mouth daily as needed. PRN for blood pressure (SBP >160 DBP >90) 12/17/17   [provider]  esomeprazole (NEXIUM) 40 MG capsule TAKE 1 CAPSULE DAILY BEFORE BREAKFAST 04/01/16   Sherlene Shams, MD  fluticasone (FLONASE) 50 MCG/ACT nasal spray Place 2 sprays into both nostrils daily as needed. 2 sprays into each nostril daily 07/28/14   Sherlene Shams, MD  latanoprost (XALATAN) 0.005 % ophthalmic solution Place 1 drop into both eyes at bedtime.     [provider]  levothyroxine (SYNTHROID, LEVOTHROID) 75 MCG tablet Take 75 mcg daily before breakfast by mouth.    [provider]  lisinopril (PRINIVIL,ZESTRIL) 20 MG tablet Take 20 mg by mouth 2 (two) times daily.    [provider]  sucralfate (CARAFATE) 1 g tablet Take 1 tablet (1 g total) by mouth 2 (two) times daily. Patient not taking: Reported on 01/03/2018 08/28/15   Sherlene Shams, MD  XARELTO 15 MG TABS tablet TAKE 1 TABLET DAILY 11/06/15   Sherlene Shams, MD    Allergies Neomycin  Family History  Problem Relation Age of Onset  . Hypertension Mother   . Stroke Mother   . Heart disease Father 48       AMI  . Hypertension Father   . Vision loss Paternal Aunt   . Cancer Neg Hx     Social History Social History   Tobacco Use  . Smoking status: Never Smoker  . Smokeless tobacco: Never Used  Substance Use Topics  . Alcohol use: No  . Drug use: No    Review of Systems Constitutional: No fever/chills Eyes: No visual changes. ENT: No sore throat. Cardiovascular: Denies chest pain. Respiratory: Denies shortness of breath. Gastrointestinal: No abdominal pain.  No nausea, no vomiting.  No diarrhea.  No constipation. Genitourinary: Negative for dysuria. Musculoskeletal: Negative for back pain. Skin: Negative for rash. Neurological: Negative for headaches, focal weakness or  numbness. Psychiatric:Anxiety Endocrine:Hyperlipidemia, hypothyroidism and hypertension Allergic/Immunilogical: Neomycin ____________________________________________   PHYSICAL EXAM:  VITAL SIGNS: ED Triage Vitals  Enc Vitals Group     BP 05/30/18 0754 (!) 152/88     Pulse Rate 05/30/18 0754 77     Resp 05/30/18 0754 16     Temp 05/30/18 0754 97.8 F (36.6 C)     Temp Source 05/30/18 0754 Oral     SpO2 05/30/18 0754 98 %     Weight 05/30/18 0755 108 lb 0.4 oz (49 kg)     Height --      Head Circumference --      Peak Flow --      Pain Score 05/30/18 0755 0     Pain Loc --      Pain Edu? --      Excl. in GC? --     Constitutional: Alert and oriented. Well appearing and in no acute distress.  Patient has  hearing deficit. Eyes: Conjunctivae are normal. PERRL. EOMI. Neck: No stridor.  Hematological/Lymphatic/Immunilogical: No cervical lymphadenopathy. Cardiovascular: Normal rate, regular rhythm. Grossly normal heart sounds.  Good peripheral circulation. Respiratory: Normal respiratory effort.  No retractions. Lungs CTAB. Gastrointestinal: Soft and nontender. No distention. No abdominal bruits. No CVA tenderness. Neurologic:  Normal speech and language. No gross focal neurologic deficits are appreciated. No gait instability. Skin:  Skin is warm, dry and intact. No rash noted. Psychiatric: Mood and affect are normal. Speech and behavior are normal.  ____________________________________________   LABS (all labs ordered are listed, but only abnormal results are displayed)  Labs Reviewed - No data to display ____________________________________________  EKG   ____________________________________________  RADIOLOGY  ED MD interpretation:    Official radiology report(s): No results found.  ____________________________________________   PROCEDURES  Procedure(s) performed:   Procedures  Critical Care performed:  No  ____________________________________________   INITIAL IMPRESSION / ASSESSMENT AND PLAN / ED COURSE  As part of my medical decision making, I reviewed the following data within the electronic MEDICAL RECORD NUMBER    Patient presents with concern for elevated blood pressure which has not normalized after the taken her medicine prior to arrival.  Patient has a history of anxiety which is probably compounded her complaint.  Patient reassured with blood pressure readings at the medication is working.  Patient advised return right ED as needed.     ____________________________________________   FINAL CLINICAL IMPRESSION(S) / ED DIAGNOSES  Final diagnoses:  Essential hypertension     ED Discharge Orders    None       Note:  This document was prepared using Dragon voice recognition software and may include unintentional dictation errors.    Joni Reining, PA-C 05/30/18 1610    Jene Every, MD 05/30/18 [phone]

## 2018-05-30 NOTE — ED Triage Notes (Signed)
Pt reports she takes her BP every morning and every night and this am it was 170/97 and pt was told to come to ED if it was elevated. Pt reports she feels fine. Pt took her meds at 0600 this am.

## 2018-05-30 NOTE — ED Notes (Signed)
Pt to ed with c/o HTN at home states she took an extra BP pill but then it was taking a long time to start to come down so she became anxious and came to hospital.  Pt denies CP, denies headache, denies blurred vision.  Denies all other symptoms.  Pt alert and oriented and states she feels "fine" at this time.

## 2018-06-14 ENCOUNTER — Emergency Department
Admission: EM | Admit: 2018-06-14 | Discharge: 2018-06-14 | Disposition: A | Discharge status: Home / Self Care | Payer: Medicare Other | Attending: Emergency Medicine | Admitting: Emergency Medicine

## 2018-06-14 ENCOUNTER — Other Ambulatory Visit: Payer: Self-pay

## 2018-06-14 DIAGNOSIS — I4891 Unspecified atrial fibrillation: Secondary | ICD-10-CM | POA: Insufficient documentation

## 2018-06-14 DIAGNOSIS — E039 Hypothyroidism, unspecified: Secondary | ICD-10-CM | POA: Diagnosis not present

## 2018-06-14 DIAGNOSIS — Z7901 Long term (current) use of anticoagulants: Secondary | ICD-10-CM | POA: Diagnosis not present

## 2018-06-14 DIAGNOSIS — Z79899 Other long term (current) drug therapy: Secondary | ICD-10-CM | POA: Diagnosis not present

## 2018-06-14 DIAGNOSIS — I10 Essential (primary) hypertension: Secondary | ICD-10-CM | POA: Diagnosis present

## 2018-06-14 NOTE — ED Triage Notes (Signed)
Patient states she takes her blood pressure every morning and this morning it was 196/121.  Patient denies any other accompanying symptoms.

## 2018-06-14 NOTE — ED Provider Notes (Signed)
[facility] Emergency Department Provider Note ____________________________________________   I have reviewed the triage vital signs and the triage nursing note.  HISTORY  Chief Complaint Hypertension   Historian Patient and husband  HPI Tiffany Grimes is a 84 y.o. female with a history of A. fib, hypertension, and TIA, on Xarelto as well as multiple blood pressure medications, follows with Dr. Dareen Piano for primary care, presents with a concern about elevated blood pressure this morning.  She states that she takes her blood pressure twice per day although her husband signs to me from behind the patient and that she takes up to 5 times per day, and states that this morning when she woke up and took her blood pressure it was in the 190/120 range.  She then took her blood pressure pills and was feeling very anxious about it and decided to come in for evaluation.  No chest pain.  No slurred speech.  No weakness or numbness.  No trouble breathing.  No lightheadedness.  No vision changes.    Past Medical History:  Diagnosis Date  . Atrial fibrillation (HCC)   . Atrial fibrillation (HCC)   . Barrett esophagus    due for EGD July 2012, [place]  . Dysrhythmia   . Hx of thyroid nodule July 2012   benign biopsy   . Hypertension   . Hypothyroidism   . Lentigo   . Reflux   . Thyroid disease   . TIA (transient ischemic attack) June 2012  . TIA (transient ischemic attack) June 2012  . Wears dentures    partial upper  . Wears hearing aid     Patient Active Problem List   Diagnosis Date Noted  . Carotid artery stenosis 02/28/2017  . Underweight 07/30/2015  . Furrowed tongue 07/30/2015  . Centrilobular emphysema (HCC) 04/18/2014  . Alpha-1-antitrypsin deficiency carrier 04/18/2014  . Unspecified hypothyroidism 04/07/2014  . Multiple pulmonary nodules 10/05/2013  . Pulmonary nodules/lesions, multiple 08/31/2013  . PAD (peripheral artery disease) (HCC)  08/08/2013  . External hemorrhoid 08/02/2013  . Anal fissure 08/02/2013  . Ecchymosis 05/10/2013  . Anxiety state 10/28/2012  . Routine general medical examination at a health care facility 06/15/2012  . 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 Osteoporosis, post-menopausal 02/11/2012  . Lumbago with sciatica of right side 02/11/2012  . Posterior neck pain 02/11/2012  . Hyperlipidemia LDL goal <100 12/16/2011  . Hx of thyroid nodule   . Atrial fibrillation, new onset (HCC) 08/02/2011  . Chest pain, atypical 08/02/2011  . TIA (transient ischemic attack)   . Barrett esophagus   . Hypertension 03/29/2011    Past Surgical History:  Procedure Laterality Date  . ABDOMINAL HYSTERECTOMY    . CATARACT EXTRACTION W/PHACO Right 07/29/2016   Procedure: CATARACT EXTRACTION PHACO AND INTRAOCULAR LENS PLACEMENT (IOC);  Surgeon: Sherald Hess, MD;  Location: [facility];  Service: Ophthalmology;  Laterality: Right;  right  . CATARACT EXTRACTION W/PHACO Left 07/02/2017   Procedure: CATARACT EXTRACTION PHACO AND INTRAOCULAR LENS PLACEMENT (IOC);  Surgeon: Nevada Crane, MD;  Location: [facility];  Service: Ophthalmology;  Laterality: Left;  [place] 00:50.9 AP% 14.8 CDE 7.54 Fluid Pack Lot # [id number]  . CESAREAN SECTION    . HERNIA REPAIR  2008   left inguinal  . THYROID SURGERY  june 18   nodule removed    Prior to Admission medications   Medication Sig Start Date End Date Taking? Authorizing Provider  amLODipine (NORVASC) 2.5 MG tablet Take  2.5 mg by mouth daily.    [provider]  amLODipine (NORVASC) 5 MG tablet TAKE 1 TABLET BY MOUTH DAILY. Patient taking differently: Take 2 tablets (10MG ) by mouth daily 06/11/16   Sherlene Shams, MD  atenolol (TENORMIN) 25 MG tablet TAKE 1 TABLET TWICE A DAY 12/21/15   Sherlene Shams, MD  atorvastatin (LIPITOR) 20 MG tablet TAKE 1 TABLET DAILY 07/14/15   Sherlene Shams, MD  azelastine (ASTELIN) 0.1 % nasal spray  Place 1 spray into both nostrils 2 (two) times daily. Use in each nostril as directed    [provider]  brimonidine (ALPHAGAN) 0.15 % ophthalmic solution 1 drop 3 (three) times daily.    [provider]  cloNIDine (CATAPRES) 0.1 MG tablet Take 0.1 mg by mouth daily as needed. PRN for blood pressure (SBP >160 DBP >90) 12/17/17   [provider]  esomeprazole (NEXIUM) 40 MG capsule TAKE 1 CAPSULE DAILY BEFORE BREAKFAST 04/01/16   Sherlene Shams, MD  fluticasone (FLONASE) 50 MCG/ACT nasal spray Place 2 sprays into both nostrils daily as needed. 2 sprays into each nostril daily 07/28/14   Sherlene Shams, MD  latanoprost (XALATAN) 0.005 % ophthalmic solution Place 1 drop into both eyes at bedtime.     [provider]  levothyroxine (SYNTHROID, LEVOTHROID) 75 MCG tablet Take 75 mcg daily before breakfast by mouth.    [provider]  lisinopril (PRINIVIL,ZESTRIL) 20 MG tablet Take 20 mg by mouth 2 (two) times daily.    [provider]  sucralfate (CARAFATE) 1 g tablet Take 1 tablet (1 g total) by mouth 2 (two) times daily. Patient not taking: Reported on 01/03/2018 08/28/15   Sherlene Shams, MD  XARELTO 15 MG TABS tablet TAKE 1 TABLET DAILY 11/06/15   Sherlene Shams, MD    Allergies  Allergen Reactions  . Neomycin Itching    Family History  Problem Relation Age of Onset  . Hypertension Mother   . Stroke Mother   . Heart disease Father 49       AMI  . Hypertension Father   . Vision loss Paternal Aunt   . Cancer Neg Hx     Social History Social History   Tobacco Use  . Smoking status: Never Smoker  . Smokeless tobacco: Never Used  Substance Use Topics  . Alcohol use: No  . Drug use: No    Review of Systems  Constitutional: Negative for fever. Eyes: Negative for visual changes. ENT: Negative for sore throat. Cardiovascular: Negative for chest pain. Respiratory: Negative for shortness of breath. Gastrointestinal: Negative for  abdominal pain, vomiting and diarrhea. Genitourinary: Negative for dysuria. Musculoskeletal: Negative for back pain. Skin: Negative for rash. Neurological: Negative for headache.  ____________________________________________   PHYSICAL EXAM:  VITAL SIGNS: ED Triage Vitals  Enc Vitals Group     BP 06/14/18 0633 (!) 174/83     Pulse Rate 06/14/18 0633 73     Resp 06/14/18 0633 17     Temp 06/14/18 0633 97.9 F (36.6 C)     Temp Source 06/14/18 0633 Oral     SpO2 06/14/18 0633 97 %     Weight 06/14/18 0634 110 lb (49.9 kg)     Height 06/14/18 0634 5\' 6"  (1.676 m)     Head Circumference --      Peak Flow --      Pain Score 06/14/18 0634 0     Pain Loc --  Pain Edu? --      Excl. in GC? --      Constitutional: Alert and oriented.  HEENT      Head: Normocephalic and atraumatic.      Eyes: Conjunctivae are normal. Pupils equal and round.       Ears:         Nose: No congestion/rhinnorhea.      Mouth/Throat: Mucous membranes are moist.      Neck: No stridor. Cardiovascular/Chest: Irregularly irregular, around the 60s.  No murmurs, rubs, or gallops. Respiratory: Normal respiratory effort without tachypnea nor retractions. Breath sounds are clear and equal bilaterally. No wheezes/rales/rhonchi. Gastrointestinal: Soft. No distention, no guarding, no rebound. Nontender.    Genitourinary/rectal:Deferred Musculoskeletal: Nontender with normal range of motion in all extremities. No joint effusions.  No lower extremity tenderness.  No edema. Neurologic:  Normal speech and language. No gross or focal neurologic deficits are appreciated. Skin:  Skin is warm, dry and intact. No rash noted. Psychiatric: Mood and affect are normal. Speech and behavior are normal. Patient exhibits appropriate insight and judgment.   ____________________________________________  LABS (pertinent positives/negatives) I, Governor Rooks, MD the attending physician have reviewed the labs noted  below.  Labs Reviewed - No data to display  ____________________________________________    EKG I, Governor Rooks, MD, the attending physician have personally viewed and interpreted all ECGs.  None ____________________________________________  RADIOLOGY   None __________________________________________  PROCEDURES  Procedure(s) performed: None  Procedures  Critical Care performed: None   ____________________________________________  ED COURSE / ASSESSMENT AND PLAN  Pertinent labs & imaging results that were available during my care of the patient were reviewed by me and considered in my medical decision making (see chart for details).     Blood pressure back in the normal range now.  It sounds like she did not have any symptoms associated with the elevated blood pressure reading this morning.  In talking to her and her husband more, it sounds like she is very anxious especially about the blood pressures although on the whole they are totally normal.  I reviewed notes in the computer and it looks like she is been to the ER several times over the past several months for high blood pressure without symptoms.  We did a long discussion about etiology of blood pressure without symptoms from acute standpoint, versus long-standing control.  It sure seems like to me that taking her blood pressure multiple times per day is actually making her more anxious.  We discussed that her blood pressures do seem to be controlled by her medications and that I would mostly like her to follow based on symptoms.  We discussed the possibility of her not checking blood pressures on a routine basis multiple times per day, but just with symptoms, and she seems a little anxious about going to that but she is willing to try.    CONSULTATIONS: None  Patient / Family / Caregiver informed of clinical course, medical decision-making process, and agree with plan.   I discussed return precautions,  follow-up instructions, and discharge instructions with patient and/or family.  Discharge Instructions : As we discussed, I think that not taking your blood pressure might be a reasonable idea, since overall your blood pressures are under control and having taken your blood pressure medication your blood pressure came right back into good control.  Return to the emergency department for any symptoms such as chest pain, sweats, trouble breathing, slurred speech, trouble finding words, weakness, numbness, confusion  altered mental status or any other symptoms concerning to you.    ___________________________________________   FINAL CLINICAL IMPRESSION(S) / ED DIAGNOSES   Final diagnoses:  Hypertension, unspecified type      ___________________________________________         Note: This dictation was prepared with Dragon dictation. Any transcriptional errors that result from this process are unintentional    Governor Rooks, MD 06/14/18 [phone]

## 2018-06-14 NOTE — Discharge Instructions (Addendum)
As we discussed, I think that not taking your blood pressure might be a reasonable idea, since overall your blood pressures are under control and having taken your blood pressure medication your blood pressure came right back into good control.  Return to the emergency department for any symptoms such as chest pain, sweats, trouble breathing, slurred speech, trouble finding words, weakness, numbness, confusion altered mental status or any other symptoms concerning to you.

## 2018-07-09 ENCOUNTER — Other Ambulatory Visit: Payer: Self-pay

## 2018-07-09 ENCOUNTER — Emergency Department: Payer: Medicare Other

## 2018-07-09 ENCOUNTER — Emergency Department
Admission: EM | Admit: 2018-07-09 | Discharge: 2018-07-09 | Disposition: A | Discharge status: Home / Self Care | Payer: Medicare Other | Attending: Emergency Medicine | Admitting: Emergency Medicine

## 2018-07-09 ENCOUNTER — Encounter: Payer: Self-pay | Admitting: Emergency Medicine

## 2018-07-09 DIAGNOSIS — I10 Essential (primary) hypertension: Secondary | ICD-10-CM | POA: Insufficient documentation

## 2018-07-09 DIAGNOSIS — R6889 Other general symptoms and signs: Secondary | ICD-10-CM | POA: Insufficient documentation

## 2018-07-09 DIAGNOSIS — E039 Hypothyroidism, unspecified: Secondary | ICD-10-CM | POA: Diagnosis not present

## 2018-07-09 DIAGNOSIS — R449 Unspecified symptoms and signs involving general sensations and perceptions: Secondary | ICD-10-CM

## 2018-07-09 DIAGNOSIS — Z79899 Other long term (current) drug therapy: Secondary | ICD-10-CM | POA: Diagnosis not present

## 2018-07-09 DIAGNOSIS — R079 Chest pain, unspecified: Secondary | ICD-10-CM | POA: Insufficient documentation

## 2018-07-09 DIAGNOSIS — Z7901 Long term (current) use of anticoagulants: Secondary | ICD-10-CM | POA: Insufficient documentation

## 2018-07-09 LAB — CBC
HEMATOCRIT: 43.6 % (ref 36.0–46.0)
HEMOGLOBIN: 13.8 g/dL (ref 12.0–15.0)
MCH: 28.4 pg (ref 26.0–34.0)
MCHC: 31.7 g/dL (ref 30.0–36.0)
MCV: 89.7 fL (ref 80.0–100.0)
Platelets: 207 10*3/uL (ref 150–400)
RBC: 4.86 MIL/uL (ref 3.87–5.11)
RDW: 12.8 % (ref 11.5–15.5)
WBC: 6.5 10*3/uL (ref 4.0–10.5)
nRBC: 0 % (ref 0.0–0.2)

## 2018-07-09 LAB — BASIC METABOLIC PANEL
Anion gap: 9 (ref 5–15)
BUN: 19 mg/dL (ref 8–23)
CHLORIDE: 102 mmol/L (ref 98–111)
CO2: 25 mmol/L (ref 22–32)
CREATININE: 0.99 mg/dL (ref 0.44–1.00)
Calcium: 9.1 mg/dL (ref 8.9–10.3)
GFR calc Af Amer: 59 mL/min — ABNORMAL LOW (ref >60)
GFR calc non Af Amer: 51 mL/min — ABNORMAL LOW (ref >60)
GLUCOSE: 186 mg/dL — AB (ref 70–99)
POTASSIUM: 3.9 mmol/L (ref 3.5–5.1)
SODIUM: 136 mmol/L (ref 135–145)

## 2018-07-09 LAB — TROPONIN I: Troponin I: <0.03 ng/mL (ref <0.03)

## 2018-07-09 NOTE — ED Provider Notes (Signed)
[facility] Emergency Department Provider Note  ____________________________________________   I have reviewed the triage vital signs and the nursing notes.   HISTORY  Chief Complaint Chest Pain   History limited by: Not Limited   HPI Tiffany Grimes is a 84 y.o. female who presents to the emergency department today because of concern that she might have had a heart attack. The patient states that she had an abnormal feeling in her left chest. She denies any pain in her chest. She denies any shortness of breath. No sweating or feeling flushed.   Per medical record review patient has a history of atrial fibrillation.  Past Medical History:  Diagnosis Date  . Atrial fibrillation (HCC)   . Atrial fibrillation (HCC)   . Barrett esophagus    due for EGD July 2012, [place]  . Dysrhythmia   . Hx of thyroid nodule July 2012   benign biopsy   . Hypertension   . Hypothyroidism   . Lentigo   . Reflux   . Thyroid disease   . TIA (transient ischemic attack) June 2012  . TIA (transient ischemic attack) June 2012  . Wears dentures    partial upper  . Wears hearing aid     Patient Active Problem List   Diagnosis Date Noted  . Carotid artery stenosis 02/28/2017  . Underweight 07/30/2015  . Furrowed tongue 07/30/2015  . Centrilobular emphysema (HCC) 04/18/2014  . Alpha-1-antitrypsin deficiency carrier 04/18/2014  . Unspecified hypothyroidism 04/07/2014  . Multiple pulmonary nodules 10/05/2013  . Pulmonary nodules/lesions, multiple 08/31/2013  . PAD (peripheral artery disease) (HCC) 08/08/2013  . External hemorrhoid 08/02/2013  . Anal fissure 08/02/2013  . Ecchymosis 05/10/2013  . Anxiety state 10/28/2012  . Routine general medical examination at a health care facility 06/15/2012  . 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 Osteoporosis, post-menopausal 02/11/2012  . Lumbago with sciatica of right side 02/11/2012  . Posterior neck pain  02/11/2012  . Hyperlipidemia LDL goal <100 12/16/2011  . Hx of thyroid nodule   . Atrial fibrillation, new onset (HCC) 08/02/2011  . Chest pain, atypical 08/02/2011  . TIA (transient ischemic attack)   . Barrett esophagus   . Hypertension 03/29/2011    Past Surgical History:  Procedure Laterality Date  . ABDOMINAL HYSTERECTOMY    . CATARACT EXTRACTION W/PHACO Right 07/29/2016   Procedure: CATARACT EXTRACTION PHACO AND INTRAOCULAR LENS PLACEMENT (IOC);  Surgeon: Sherald HessAnita Prakash Vin-Parikh, MD;  [facility];  Service: Ophthalmology;  Laterality: Right;  right  . CATARACT EXTRACTION W/PHACO Left 07/02/2017   Procedure: CATARACT EXTRACTION PHACO AND INTRAOCULAR LENS PLACEMENT (IOC);  Surgeon: Nevada CraneKing, Bradley Mark, MD;  Location: [facility];  Service: Ophthalmology;  Laterality: Left;  US 00:50.9 AP% 14.8 CDE 7.54 Fluid Pack Lot # [id number] H  . CESAREAN SECTION    . HERNIA REPAIR  2008   left inguinal  . THYROID SURGERY  june 18   nodule removed    Prior to Admission medications   Medication Sig Start Date End Date Taking? Authorizing Provider  amLODipine (NORVASC) 2.5 MG tablet Take 2.5 mg by mouth daily.    [provider]  amLODipine (NORVASC) 5 MG tablet TAKE 1 TABLET BY MOUTH DAILY. Patient taking differently: Take 2 tablets (10MG ) by mouth daily 06/11/16   Sherlene Shamsullo, Teresa L, MD  atenolol (TENORMIN) 25 MG tablet TAKE 1 TABLET TWICE A DAY 12/21/15   Sherlene Shamsullo, Teresa L, MD  atorvastatin (LIPITOR) 20 MG tablet TAKE 1 TABLET DAILY  07/14/15   Sherlene Shams, MD  azelastine (ASTELIN) 0.1 % nasal spray Place 1 spray into both nostrils 2 (two) times daily. Use in each nostril as directed    [provider]  brimonidine (ALPHAGAN) 0.15 % ophthalmic solution 1 drop 3 (three) times daily.    [provider]  cloNIDine (CATAPRES) 0.1 MG tablet Take 0.1 mg by mouth daily as needed. PRN for blood pressure (SBP >160 DBP >90) 12/17/17   [provider]   esomeprazole (NEXIUM) 40 MG capsule TAKE 1 CAPSULE DAILY BEFORE BREAKFAST 04/01/16   Sherlene Shams, MD  fluticasone (FLONASE) 50 MCG/ACT nasal spray Place 2 sprays into both nostrils daily as needed. 2 sprays into each nostril daily 07/28/14   Sherlene Shams, MD  latanoprost (XALATAN) 0.005 % ophthalmic solution Place 1 drop into both eyes at bedtime.     [provider]  levothyroxine (SYNTHROID, LEVOTHROID) 75 MCG tablet Take 75 mcg daily before breakfast by mouth.    [provider]  lisinopril (PRINIVIL,ZESTRIL) 20 MG tablet Take 20 mg by mouth 2 (two) times daily.    [provider]  sucralfate (CARAFATE) 1 g tablet Take 1 tablet (1 g total) by mouth 2 (two) times daily. Patient not taking: Reported on 01/03/2018 08/28/15   Sherlene Shams, MD  XARELTO 15 MG TABS tablet TAKE 1 TABLET DAILY 11/06/15   Sherlene Shams, MD    Allergies Neomycin  Family History  Problem Relation Age of Onset  . Hypertension Mother   . Stroke Mother   . Heart disease Father 75       AMI  . Hypertension Father   . Vision loss Paternal Aunt   . Cancer Neg Hx     Social History Social History   Tobacco Use  . Smoking status: Never Smoker  . Smokeless tobacco: Never Used  Substance Use Topics  . Alcohol use: No  . Drug use: No    Review of Systems Constitutional: No fever/chills Eyes: No visual changes. ENT: No sore throat. Cardiovascular: Denies chest pain. Respiratory: Denies shortness of breath. Gastrointestinal: No abdominal pain.  No nausea, no vomiting.  No diarrhea.   Genitourinary: Negative for dysuria. Musculoskeletal: Negative for back pain. Skin: Negative for rash. Neurological: Negative for headaches, focal weakness or numbness.  ____________________________________________   PHYSICAL EXAM:  VITAL SIGNS: ED Triage Vitals  Enc Vitals Group     BP 07/09/18 1807 (!) 171/81     Pulse Rate 07/09/18 1807 78     Resp 07/09/18 1807 12     Temp  07/09/18 1807 97.7 F (36.5 C)     Temp Source 07/09/18 1807 Oral     SpO2 07/09/18 1807 98 %     Weight --      Height --      Head Circumference --      Peak Flow --      Pain Score 07/09/18 1815 0   Constitutional: Alert and oriented.  Eyes: Conjunctivae are normal.  ENT      Head: Normocephalic and atraumatic.      Nose: No congestion/rhinnorhea.      Mouth/Throat: Mucous membranes are moist.      Neck: No stridor. Hematological/Lymphatic/Immunilogical: No cervical lymphadenopathy. Cardiovascular: Normal rate, regular rhythm.  No murmurs, rubs, or gallops.  Respiratory: Normal respiratory effort without tachypnea nor retractions. Breath sounds are clear and equal bilaterally. No wheezes/rales/rhonchi. Gastrointestinal: Soft and non tender. No rebound. No guarding.  Genitourinary:  Deferred Musculoskeletal: Normal range of motion in all extremities. No lower extremity edema. Neurologic:  Normal speech and language. No gross focal neurologic deficits are appreciated.  Skin:  Skin is warm, dry and intact. No rash noted. Psychiatric: Mood and affect are normal. Speech and behavior are normal. Patient exhibits appropriate insight and judgment.  ____________________________________________    LABS (pertinent positives/negatives)  BMP na 136, k 3.9, glu 186, cr 0.99 CBC wbc 6.5, hgb 13.8, plt 207 Trop <0.03  ____________________________________________   EKG  I, Phineas Semen, attending physician, personally viewed and interpreted this EKG  EKG Time: 1813 Rate: 81 Rhythm: atrial fibrillation Axis: normal Intervals: qtc 446 QRS: narrow, q waves v1, v2 ST changes: no st elevation Impression: abnormal ekg  ____________________________________________    RADIOLOGY  CXR No acute disease  ____________________________________________   PROCEDURES  Procedures  ____________________________________________   INITIAL IMPRESSION / ASSESSMENT AND PLAN / ED  COURSE  Pertinent labs & imaging results that were available during my care of the patient were reviewed by me and considered in my medical decision making (see chart for details).   Patient presented to the emergency department because of concern for possible heart attack. Patient with negative serial troponins. Work up otherwise unremarkable. Will plan on discharge. Discussed follow up with pcp.  ____________________________________________   FINAL CLINICAL IMPRESSION(S) / ED DIAGNOSES  Final diagnoses:  Abnormal feeling     Note: This dictation was prepared with Dragon dictation. Any transcriptional errors that result from this process are unintentional     Phineas Semen, MD 07/10/18 [phone]

## 2018-07-09 NOTE — ED Triage Notes (Signed)
PT asked what brought her to the ED, pt states "I have had a mild heart attack." Pt asked what symptoms lead her to believe she had a heart attack. Pt states "none." Pt states, "I just know I have had a heart attack." Pt denies any episode of pain, discomfort, or pressure.

## 2018-07-09 NOTE — ED Notes (Signed)
Introduced to patient, undressed and placed on cardiac, SpO2 and NIBP monitoring at this time. Afib on the monitor at a controlled rate of 60, hx of the same. Call bell within reach. Husband at bedside. Awaits provider for evaluation at this time.

## 2018-07-09 NOTE — ED Notes (Signed)
Repeat troponin collected and sent to lab for analysis.

## 2018-07-09 NOTE — ED Notes (Signed)
Pt verbalized understanding of d/c instructions and f/u care. No further questions at this time. Pt ambulatory with steady gait to the exit.  

## 2018-07-09 NOTE — Discharge Instructions (Addendum)
Please seek medical attention for any high fevers, chest pain, shortness of breath, change in behavior, persistent vomiting, bloody stool or any other new or concerning symptoms.  

## 2018-07-21 ENCOUNTER — Ambulatory Visit (INDEPENDENT_AMBULATORY_CARE_PROVIDER_SITE_OTHER): Payer: Medicare Other | Admitting: Vascular Surgery

## 2018-07-21 ENCOUNTER — Encounter (INDEPENDENT_AMBULATORY_CARE_PROVIDER_SITE_OTHER): Payer: Self-pay | Admitting: Vascular Surgery

## 2018-07-21 ENCOUNTER — Other Ambulatory Visit (INDEPENDENT_AMBULATORY_CARE_PROVIDER_SITE_OTHER): Payer: Self-pay | Admitting: Vascular Surgery

## 2018-07-21 ENCOUNTER — Other Ambulatory Visit (INDEPENDENT_AMBULATORY_CARE_PROVIDER_SITE_OTHER): Payer: Medicare Other

## 2018-07-21 ENCOUNTER — Other Ambulatory Visit: Payer: Self-pay

## 2018-07-21 VITALS — BP 109/56 | HR 48 | Resp 16 | Ht 67.0 in | Wt 116.0 lb

## 2018-07-21 DIAGNOSIS — I679 Cerebrovascular disease, unspecified: Secondary | ICD-10-CM

## 2018-07-21 DIAGNOSIS — I6523 Occlusion and stenosis of bilateral carotid arteries: Secondary | ICD-10-CM

## 2018-07-21 DIAGNOSIS — I2510 Atherosclerotic heart disease of native coronary artery without angina pectoris: Secondary | ICD-10-CM | POA: Insufficient documentation

## 2018-07-21 DIAGNOSIS — I10 Essential (primary) hypertension: Secondary | ICD-10-CM | POA: Diagnosis not present

## 2018-07-21 DIAGNOSIS — E785 Hyperlipidemia, unspecified: Secondary | ICD-10-CM

## 2018-07-21 DIAGNOSIS — Z8673 Personal history of transient ischemic attack (TIA), and cerebral infarction without residual deficits: Secondary | ICD-10-CM | POA: Insufficient documentation

## 2018-07-21 DIAGNOSIS — I272 Pulmonary hypertension, unspecified: Secondary | ICD-10-CM | POA: Insufficient documentation

## 2018-07-21 NOTE — Progress Notes (Signed)
MRN : [id number]  Tiffany Grimes is a 84 y.o. (1928-08-18) female who presents with chief complaint of  Chief Complaint  Patient presents with  . Arterial Stenosis  .  History of Present Illness: Patient returns in follow-up of her carotid disease.  She is doing well today without any specific complaints.  She was in the emergency room a few weeks ago with significant high blood pressure but adjustments of her medications have resulted in marked improvement of her blood pressure and she feels much better.  No focal neurologic symptoms. Specifically, the patient denies amaurosis fugax, speech or swallowing difficulties, or arm or leg weakness or numbness.  Her carotid duplex today reveals stable 1 to 39% ICA stenosis bilaterally without significant progression from her previous study.  Current Outpatient Medications  Medication Sig Dispense Refill  . amLODipine (NORVASC) 2.5 MG tablet Take 2.5 mg by mouth daily.    Marland Kitchen amLODipine (NORVASC) 5 MG tablet TAKE 1 TABLET BY MOUTH DAILY. (Patient taking differently: Take 2 tablets (10MG ) by mouth daily) 30 tablet 2  . atenolol (TENORMIN) 25 MG tablet TAKE 1 TABLET TWICE A DAY 180 tablet 1  . atorvastatin (LIPITOR) 20 MG tablet TAKE 1 TABLET DAILY 90 tablet 2  . azelastine (ASTELIN) 0.1 % nasal spray Place 1 spray into both nostrils 2 (two) times daily. Use in each nostril as directed    . brimonidine (ALPHAGAN) 0.15 % ophthalmic solution 1 drop 3 (three) times daily.    . cloNIDine (CATAPRES) 0.1 MG tablet Take 0.1 mg by mouth daily as needed. PRN for blood pressure (SBP >160 DBP >90)  4  . esomeprazole (NEXIUM) 40 MG capsule TAKE 1 CAPSULE DAILY BEFORE BREAKFAST 90 capsule 1  . fluticasone (FLONASE) 50 MCG/ACT nasal spray Place 2 sprays into both nostrils daily as needed. 2 sprays into each nostril daily 16 g 0  . latanoprost (XALATAN) 0.005 % ophthalmic solution Place 1 drop into both eyes at bedtime.     Marland Kitchen levothyroxine (SYNTHROID, LEVOTHROID) 75  MCG tablet Take 75 mcg daily before breakfast by mouth.    Marland Kitchen lisinopril (PRINIVIL,ZESTRIL) 20 MG tablet Take 20 mg by mouth 2 (two) times daily.    . sucralfate (CARAFATE) 1 g tablet Take 1 tablet (1 g total) by mouth 2 (two) times daily. 180 tablet 3  . XARELTO 15 MG TABS tablet TAKE 1 TABLET DAILY 90 tablet 2   No current facility-administered medications for this visit.     Past Medical History:  Diagnosis Date  . Atrial fibrillation (HCC)   . Atrial fibrillation (HCC)   . Barrett esophagus    due for EGD July 2012, [place]  . Dysrhythmia   . Hx of thyroid nodule July 2012   benign biopsy   . Hypertension   . Hypothyroidism   . Lentigo   . Reflux   . Thyroid disease   . TIA (transient ischemic attack) June 2012  . TIA (transient ischemic attack) June 2012  . Wears dentures    partial upper  . Wears hearing aid     Past Surgical History:  Procedure Laterality Date  . ABDOMINAL HYSTERECTOMY    . CATARACT EXTRACTION W/PHACO Right 07/29/2016   Procedure: CATARACT EXTRACTION PHACO AND INTRAOCULAR LENS PLACEMENT (IOC);  Surgeon: Sherald Hess, MD;  Location: [facility];  Service: Ophthalmology;  Laterality: Right;  right  . CATARACT EXTRACTION W/PHACO Left 07/02/2017   Procedure: CATARACT EXTRACTION PHACO AND INTRAOCULAR LENS PLACEMENT (IOC);  Surgeon: Nevada CraneKing, Bradley Mark, MD;  Location: [facility];  Service: Ophthalmology;  Laterality: Left;  US 00:50.9 AP% 14.8 CDE 7.54 Fluid Pack Lot # [id number] H  . CESAREAN SECTION    . HERNIA REPAIR  2008   left inguinal  . THYROID SURGERY  june 18   nodule removed   Social History  Substance Use Topics  . Smoking status: Never Smoker  . Smokeless tobacco: Never Used  . Alcohol use No    Family History      Family History  Problem Relation Age of Onset  . Hypertension Mother   . Stroke Mother   . Heart disease Father [id number]       AMI  . Hypertension Father   . Vision loss Paternal Aunt   . Cancer Neg Hx          Allergies  Allergen Reactions  . Neomycin Itching     REVIEW OF SYSTEMS (Negative unless checked)  Constitutional: [] ?Weight loss  [] ?Fever  [] ?Chills Cardiac: [] ?Chest pain   [] ?Chest pressure   [] ?Palpitations   [] ?Shortness of breath when laying flat   [] ?Shortness of breath at rest   [] ?Shortness of breath with exertion. Vascular:  [] ?Pain in legs with walking   [] ?Pain in legs at rest   [] ?Pain in legs when laying flat   [] ?Claudication   [] ?Pain in feet when walking  [] ?Pain in feet at rest  [] ?Pain in feet when laying flat   [] ?History of DVT   [] ?Phlebitis   [] ?Swelling in legs   [] ?Varicose veins   [] ?Non-healing ulcers Pulmonary:   [] ?Uses home oxygen   [] ?Productive cough   [] ?Hemoptysis   [] ?Wheeze  [] ?COPD   [] ?Asthma Neurologic:  [] ?Dizziness  [] ?Blackouts   [] ?Seizures   [] ?History of stroke   [] ?History of TIA  [] ?Aphasia   [] ?Temporary blindness   [] ?Dysphagia   [] ?Weakness or numbness in arms   [] ?Weakness or numbness in legs Musculoskeletal:  [x] ?Arthritis   [] ?Joint swelling   [] ?Joint pain   [] ?Low back pain Hematologic:  [] ?Easy bruising  [] ?Easy bleeding   [] ?Hypercoagulable state   [] ?Anemic  [] ?Hepatitis Gastrointestinal:  [] ?Blood in stool   [] ?Vomiting blood  [] ?Gastroesophageal reflux/heartburn   [] ?Difficulty swallowing. Genitourinary:  [] ?Chronic kidney disease   [] ?Difficult urination  [] ?Frequent urination  [] ?Burning with urination   [] ?Blood in urine Skin:  [] ?Rashes   [] ?Ulcers   [] ?Wounds Psychological:  [] ?History of anxiety   [] ? History of major depression.    Physical Examination  Vitals:   07/21/18 0829  BP: (!) 109/56  Pulse: (!) 48  Resp: 16  Weight: 116 lb (52.6 kg)  Height: 5\' 7"  (1.702 m)   Body mass index is 18.17 kg/m. Gen:  WD/WN, NAD.  Appears younger than stated age Head: [place]/AT, No temporalis wasting. Ear/Nose/Throat: Hearing grossly intact, nares w/o erythema or drainage, trachea midline Eyes: Conjunctiva  clear. Sclera non-icteric Neck: Supple.  No bruit  Pulmonary:  Good air movement, equal and clear to auscultation bilaterally.  Cardiac: irregular Vascular:  Vessel Right Left  Radial Palpable Palpable                   Musculoskeletal: M/S 5/5 throughout.  No deformity or atrophy.  No edema. Neurologic: CN 2-12 intact. Sensation grossly intact in extremities.  Symmetrical.  Speech is fluent. Motor exam as listed above. Psychiatric: Judgment intact, Mood & affect appropriate for pt's clinical situation. Dermatologic: No rashes or ulcers noted.  No cellulitis or open wounds. Lymph :  No Cervical, Axillary, or Inguinal lymphadenopathy.     CBC Lab Results  Component Value Date   WBC 6.5 07/09/2018   HGB 13.8 07/09/2018   HCT 43.6 07/09/2018   MCV 89.7 07/09/2018   PLT 207 07/09/2018    BMET    Component Value Date/Time   NA 136 07/09/2018 1815   NA 135 10/25/2014 1411   K 3.9 07/09/2018 1815   K 4.0 10/25/2014 1411   CL 102 07/09/2018 1815   CL 98 (L) 10/25/2014 1411   CO2 25 07/09/2018 1815   CO2 29 10/25/2014 1411   GLUCOSE 186 (H) 07/09/2018 1815   GLUCOSE 110 (H) 10/25/2014 1411   BUN 19 07/09/2018 1815   BUN 17 10/25/2014 1411   CREATININE 0.99 07/09/2018 1815   CREATININE 0.88 10/25/2014 1411   CALCIUM 9.1 07/09/2018 1815   CALCIUM 9.2 10/25/2014 1411   GFRNONAA 51 (L) 07/09/2018 1815   GFRNONAA >60 10/25/2014 1411   GFRAA 59 (L) 07/09/2018 1815   GFRAA >60 10/25/2014 1411   Estimated Creatinine Clearance: 33.2 mL/min (by C-G formula based on SCr of 0.99 mg/dL).  COAG Lab Results  Component Value Date   INR 1.37 06/07/2017   INR 2.2 07/19/2014   INR 2.5 12/18/2013    Radiology Dg Chest 2 View  Result Date: 07/09/2018 CLINICAL DATA:  Patient concerned for possible heart attack. EXAM: CHEST - 2 VIEW COMPARISON:  Chest radiograph 01/03/2018 FINDINGS: Cardiomegaly. Tortuosity of the thoracic aorta. No large area pulmonary consolidation. No pleural  effusion or pneumothorax. Regional skeleton is unremarkable. Old right posterior fifth rib fracture. IMPRESSION: No acute cardiopulmonary process. Electronically Signed   By: Annia Belt M.D.   On: 07/09/2018 18:53     Assessment/Plan Hypertension blood pressure control important in reducing the progression of atherosclerotic disease. On appropriate oral medications.   Hyperlipidemia LDL goal <100 lipid control important in reducing the progression of atherosclerotic disease. Continue statin therapy  Carotid artery stenosis Her carotid duplex today reveals stable 1 to 39% ICA stenosis bilaterally without significant progression from her previous study. No changes. On anticoagulation already for a. Fib. Recheck in two years.     Festus Barren, MD  07/21/2018 9:12 AM    This note was created with Dragon medical transcription system.  Any errors from dictation are purely unintentional

## 2018-07-21 NOTE — Assessment & Plan Note (Signed)
Her carotid duplex today reveals stable 1 to 39% ICA stenosis bilaterally without significant progression from her previous study. No changes. On anticoagulation already for a. Fib. Recheck in two years.

## 2018-12-20 ENCOUNTER — Other Ambulatory Visit: Payer: Self-pay

## 2018-12-20 ENCOUNTER — Emergency Department
Admission: EM | Admit: 2018-12-20 | Discharge: 2018-12-21 | Disposition: A | Discharge status: Home / Self Care | Payer: Medicare Other | Attending: Emergency Medicine | Admitting: Emergency Medicine

## 2018-12-20 DIAGNOSIS — I2510 Atherosclerotic heart disease of native coronary artery without angina pectoris: Secondary | ICD-10-CM | POA: Diagnosis not present

## 2018-12-20 DIAGNOSIS — I4891 Unspecified atrial fibrillation: Secondary | ICD-10-CM | POA: Diagnosis not present

## 2018-12-20 DIAGNOSIS — R443 Hallucinations, unspecified: Secondary | ICD-10-CM | POA: Insufficient documentation

## 2018-12-20 DIAGNOSIS — Z79899 Other long term (current) drug therapy: Secondary | ICD-10-CM | POA: Diagnosis not present

## 2018-12-20 DIAGNOSIS — I10 Essential (primary) hypertension: Secondary | ICD-10-CM | POA: Insufficient documentation

## 2018-12-20 DIAGNOSIS — E119 Type 2 diabetes mellitus without complications: Secondary | ICD-10-CM | POA: Insufficient documentation

## 2018-12-20 LAB — CBC
HCT: 42.1 % (ref 36.0–46.0)
Hemoglobin: 13.7 g/dL (ref 12.0–15.0)
MCH: 28.8 pg (ref 26.0–34.0)
MCHC: 32.5 g/dL (ref 30.0–36.0)
MCV: 88.4 fL (ref 80.0–100.0)
Platelets: 179 10*3/uL (ref 150–400)
RBC: 4.76 MIL/uL (ref 3.87–5.11)
RDW: 13.3 % (ref 11.5–15.5)
WBC: 5.6 10*3/uL (ref 4.0–10.5)
nRBC: 0 % (ref 0.0–0.2)

## 2018-12-20 LAB — COMPREHENSIVE METABOLIC PANEL
ALT: 21 U/L (ref 0–44)
AST: 26 U/L (ref 15–41)
Albumin: 4.7 g/dL (ref 3.5–5.0)
Alkaline Phosphatase: 75 U/L (ref 38–126)
Anion gap: 10 (ref 5–15)
BUN: 22 mg/dL (ref 8–23)
CO2: 26 mmol/L (ref 22–32)
Calcium: 9.4 mg/dL (ref 8.9–10.3)
Chloride: 104 mmol/L (ref 98–111)
Creatinine, Ser: 1.05 mg/dL — ABNORMAL HIGH (ref 0.44–1.00)
GFR calc Af Amer: 55 mL/min — ABNORMAL LOW (ref >60)
GFR calc non Af Amer: 47 mL/min — ABNORMAL LOW (ref >60)
Glucose, Bld: 191 mg/dL — ABNORMAL HIGH (ref 70–99)
Potassium: 3.9 mmol/L (ref 3.5–5.1)
Sodium: 140 mmol/L (ref 135–145)
Total Bilirubin: 0.7 mg/dL (ref 0.3–1.2)
Total Protein: 7.3 g/dL (ref 6.5–8.1)

## 2018-12-20 LAB — ACETAMINOPHEN LEVEL: Acetaminophen (Tylenol), Serum: <10 ug/mL — ABNORMAL LOW (ref 10–30)

## 2018-12-20 LAB — URINALYSIS, COMPLETE (UACMP) WITH MICROSCOPIC
Bacteria, UA: NONE SEEN
Bilirubin Urine: NEGATIVE
Glucose, UA: NEGATIVE mg/dL
Ketones, ur: NEGATIVE mg/dL
Leukocytes,Ua: NEGATIVE
Nitrite: NEGATIVE
Protein, ur: NEGATIVE mg/dL
Specific Gravity, Urine: 1.008 (ref 1.005–1.030)
Squamous Epithelial / LPF: NONE SEEN (ref 0–5)
pH: 7 (ref 5.0–8.0)

## 2018-12-20 LAB — URINE DRUG SCREEN, QUALITATIVE (ARMC ONLY)
Amphetamines, Ur Screen: NOT DETECTED
Barbiturates, Ur Screen: NOT DETECTED
Benzodiazepine, Ur Scrn: NOT DETECTED
Cannabinoid 50 Ng, Ur ~~LOC~~: NOT DETECTED
Cocaine Metabolite,Ur ~~LOC~~: NOT DETECTED
MDMA (Ecstasy)Ur Screen: NOT DETECTED
Methadone Scn, Ur: NOT DETECTED
Opiate, Ur Screen: NOT DETECTED
Phencyclidine (PCP) Ur S: NOT DETECTED
Tricyclic, Ur Screen: NOT DETECTED

## 2018-12-20 LAB — TSH: TSH: 1.971 u[IU]/mL (ref 0.350–4.500)

## 2018-12-20 LAB — SALICYLATE LEVEL: Salicylate Lvl: <7 mg/dL (ref 2.8–30.0)

## 2018-12-20 LAB — ETHANOL: Alcohol, Ethyl (B): <10 mg/dL (ref <10)

## 2018-12-20 MED ORDER — LEVOTHYROXINE SODIUM 50 MCG PO TABS: 75.0000 ug | ORAL_TABLET | Freq: Every day | ORAL | Status: DC

## 2018-12-20 MED ORDER — LATANOPROST 0.005 % OP SOLN
1.0000 [drp] | Freq: Every day | OPHTHALMIC | Status: DC
  Filled 2018-12-20: qty 2.5

## 2018-12-20 MED ORDER — ATENOLOL 25 MG PO TABS
25.0000 mg | ORAL_TABLET | Freq: Two times a day (BID) | ORAL | Status: DC
  Administered 2018-12-21: 25 mg via ORAL
  Filled 2018-12-20: qty 1

## 2018-12-20 MED ORDER — ATORVASTATIN CALCIUM 20 MG PO TABS: 20.0000 mg | ORAL_TABLET | Freq: Every day | ORAL | Status: DC

## 2018-12-20 MED ORDER — BRIMONIDINE TARTRATE 0.15 % OP SOLN
1.0000 [drp] | Freq: Three times a day (TID) | OPHTHALMIC | Status: DC
  Filled 2018-12-20: qty 5

## 2018-12-20 MED ORDER — FUROSEMIDE 40 MG PO TABS: 20.0000 mg | ORAL_TABLET | Freq: Every day | ORAL | Status: DC | PRN

## 2018-12-20 MED ORDER — LISINOPRIL 10 MG PO TABS
20.0000 mg | ORAL_TABLET | Freq: Two times a day (BID) | ORAL | Status: DC
  Administered 2018-12-21: 20 mg via ORAL
  Filled 2018-12-20: qty 2

## 2018-12-20 MED ORDER — CLONIDINE HCL 0.1 MG PO TABS
0.1000 mg | ORAL_TABLET | Freq: Once | ORAL | Status: AC
  Administered 2018-12-20: 0.1 mg via ORAL
  Filled 2018-12-20: qty 1

## 2018-12-20 MED ORDER — CLONIDINE HCL 0.1 MG PO TABS: 0.1000 mg | ORAL_TABLET | Freq: Two times a day (BID) | ORAL | Status: DC

## 2018-12-20 MED ORDER — RIVAROXABAN 15 MG PO TABS: 15.0000 mg | ORAL_TABLET | Freq: Every day | ORAL | Status: DC

## 2018-12-20 MED ORDER — AMLODIPINE BESYLATE 5 MG PO TABS: 5.0000 mg | ORAL_TABLET | Freq: Every day | ORAL | Status: DC
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
## 2018-12-20 NOTE — ED Notes (Signed)
ED Provider at bedside. 
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
## 2018-12-20 NOTE — ED Notes (Signed)
SOC cart placed in room 
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
## 2018-12-20 NOTE — ED Notes (Signed)
Patient up to toilet to provide urine specimen.

## 2018-12-20 NOTE — ED Notes (Signed)
TTS at bedside. 
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
## 2018-12-21 NOTE — ED Provider Notes (Signed)
Patient has been evaluated by [facility] psychiatry.  After extensive discussion with family and patient no indication for admission to the hospital.  Medical evaluation has been reassuring.  Patient stable appropriate for outpatient follow-up.   Willy Eddy, MD 12/21/18 [phone]

## 2018-12-21 NOTE — ED Notes (Signed)
Reviewed discharge instructions and follow-up care with patient. Patient verbalized understanding of all information reviewed. Patient stable, with no distress noted at this time.    

## 2018-12-21 NOTE — Discharge Instructions (Addendum)
Follow up with PCP.  Follow up with [facility] for psychiatric clinic evaluation.  Return for any worsening symptoms, questions or concerns.

## 2018-12-25 ENCOUNTER — Emergency Department
Admission: EM | Admit: 2018-12-25 | Discharge: 2018-12-26 | Disposition: A | Discharge status: Home / Self Care | Payer: Medicare Other | Attending: Emergency Medicine | Admitting: Emergency Medicine

## 2018-12-25 ENCOUNTER — Encounter: Payer: Self-pay | Admitting: Emergency Medicine

## 2018-12-25 ENCOUNTER — Other Ambulatory Visit: Payer: Self-pay

## 2018-12-25 ENCOUNTER — Emergency Department: Payer: Medicare Other

## 2018-12-25 DIAGNOSIS — N183 Chronic kidney disease, stage 3 (moderate): Secondary | ICD-10-CM | POA: Insufficient documentation

## 2018-12-25 DIAGNOSIS — Z79899 Other long term (current) drug therapy: Secondary | ICD-10-CM | POA: Insufficient documentation

## 2018-12-25 DIAGNOSIS — E1122 Type 2 diabetes mellitus with diabetic chronic kidney disease: Secondary | ICD-10-CM | POA: Insufficient documentation

## 2018-12-25 DIAGNOSIS — F0390 Unspecified dementia without behavioral disturbance: Secondary | ICD-10-CM | POA: Insufficient documentation

## 2018-12-25 DIAGNOSIS — Z8673 Personal history of transient ischemic attack (TIA), and cerebral infarction without residual deficits: Secondary | ICD-10-CM | POA: Diagnosis not present

## 2018-12-25 DIAGNOSIS — R0789 Other chest pain: Secondary | ICD-10-CM | POA: Diagnosis not present

## 2018-12-25 DIAGNOSIS — I129 Hypertensive chronic kidney disease with stage 1 through stage 4 chronic kidney disease, or unspecified chronic kidney disease: Secondary | ICD-10-CM | POA: Diagnosis not present

## 2018-12-25 DIAGNOSIS — Z7901 Long term (current) use of anticoagulants: Secondary | ICD-10-CM | POA: Diagnosis not present

## 2018-12-25 DIAGNOSIS — E039 Hypothyroidism, unspecified: Secondary | ICD-10-CM | POA: Insufficient documentation

## 2018-12-25 DIAGNOSIS — I2510 Atherosclerotic heart disease of native coronary artery without angina pectoris: Secondary | ICD-10-CM | POA: Insufficient documentation

## 2018-12-25 LAB — COMPREHENSIVE METABOLIC PANEL
ALT: 20 U/L (ref 0–44)
AST: 26 U/L (ref 15–41)
Albumin: 4.6 g/dL (ref 3.5–5.0)
Alkaline Phosphatase: 98 U/L (ref 38–126)
Anion gap: 10 (ref 5–15)
BUN: 22 mg/dL (ref 8–23)
CO2: 25 mmol/L (ref 22–32)
Calcium: 9.2 mg/dL (ref 8.9–10.3)
Chloride: 105 mmol/L (ref 98–111)
Creatinine, Ser: 1.13 mg/dL — ABNORMAL HIGH (ref 0.44–1.00)
GFR calc Af Amer: 50 mL/min — ABNORMAL LOW (ref >60)
GFR calc non Af Amer: 43 mL/min — ABNORMAL LOW (ref >60)
Glucose, Bld: 184 mg/dL — ABNORMAL HIGH (ref 70–99)
Potassium: 3.9 mmol/L (ref 3.5–5.1)
Sodium: 140 mmol/L (ref 135–145)
Total Bilirubin: 0.8 mg/dL (ref 0.3–1.2)
Total Protein: 7.2 g/dL (ref 6.5–8.1)

## 2018-12-25 LAB — CBC
HCT: 40.9 % (ref 36.0–46.0)
Hemoglobin: 13.1 g/dL (ref 12.0–15.0)
MCH: 29 pg (ref 26.0–34.0)
MCHC: 32 g/dL (ref 30.0–36.0)
MCV: 90.7 fL (ref 80.0–100.0)
Platelets: 183 10*3/uL (ref 150–400)
RBC: 4.51 MIL/uL (ref 3.87–5.11)
RDW: 13.2 % (ref 11.5–15.5)
WBC: 4.8 10*3/uL (ref 4.0–10.5)
nRBC: 0 % (ref 0.0–0.2)

## 2018-12-25 LAB — TROPONIN I
Troponin I: <0.03 ng/mL (ref <0.03)
Troponin I: <0.03 ng/mL (ref <0.03)

## 2018-12-25 NOTE — ED Triage Notes (Signed)
Patient with complaint of right sided chest pain that radiated to the left side of her heart that started about an hour ago. Patient states that the pain has resolved. Patient denies shortness of breath, nausea and vomiting.

## 2018-12-25 NOTE — Discharge Instructions (Signed)
Return to the ER for new, worsening, or persistent chest discomfort, any difficulty breathing, weakness or lightheadedness, or any other new or worsening symptoms that concern you.

## 2018-12-25 NOTE — ED Provider Notes (Signed)
[facility] Emergency Department Provider Note ____________________________________________   First MD Initiated Contact with Patient 12/25/18 2201     (approximate)  I have reviewed the triage vital signs and the nursing notes.   HISTORY  Chief Complaint Chest Pain    HPI Tiffany Grimes is a 85 y.o. female with PMH as noted below who presents with chest discomfort, acute onset earlier this morning and resolved after about 20 seconds.  She states it was not pain but merely a strange feeling that started around the right side of her chest, moved in towards the center, and then stopped.  She denies associated shortness of breath, lightheadedness, or nausea.  She states she had a few less severe similar episodes recently.  She was not exerting herself at this time.  The patient states that she has been doing increased housework and cleaning recently and also reaching up to put things away in cabinets.  She thinks that she may have strained something doing this.  Past Medical History:  Diagnosis Date  . Atrial fibrillation (HCC)   . Atrial fibrillation (HCC)   . Barrett esophagus    due for EGD July 2012, [place]  . Dysrhythmia   . Hx of thyroid nodule July 2012   benign biopsy   . Hypertension   . Hypothyroidism   . Lentigo   . Reflux   . Thyroid disease   . TIA (transient ischemic attack) June 2012  . TIA (transient ischemic attack) June 2012  . Wears dentures    partial upper  . Wears hearing aid     Patient Active Problem List   Diagnosis Date Noted  . Coronary artery disease 07/21/2018  . History of TIA (transient ischemic attack) 07/21/2018  . Pulmonary hypertension (HCC) 07/21/2018  . Type 2 diabetes mellitus with stage 3 chronic kidney disease, without long-term current use of insulin (HCC) 05/06/2018  . Mild protein-calorie malnutrition (HCC) 02/10/2018  . Carotid artery stenosis 02/28/2017  . Alpha-1-antitrypsin deficiency (HCC)  10/19/2015  . History of depression 10/19/2015  . Underweight 07/30/2015  . Furrowed tongue 07/30/2015  . Benign essential HTN 05/11/2014  . Severe mitral insufficiency 05/11/2014  . Centrilobular emphysema (HCC) 04/18/2014  . Alpha-1-antitrypsin deficiency carrier 04/18/2014  . Unspecified hypothyroidism 04/07/2014  . Multiple pulmonary nodules 10/05/2013  . Pulmonary nodules/lesions, multiple 08/31/2013  . PAD (peripheral artery disease) (HCC) 08/08/2013  . External hemorrhoid 08/02/2013  . Anal fissure 08/02/2013  . Ecchymosis 05/10/2013  . Anxiety state 10/28/2012  . Routine general medical examination at a health care facility 06/15/2012  . 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 Osteoporosis, post-menopausal 02/11/2012  . Lumbago with sciatica of right side 02/11/2012  . Posterior neck pain 02/11/2012  . Hyperlipidemia LDL goal <100 12/16/2011  . Hx of thyroid nodule   . Atrial fibrillation, new onset (HCC) 08/02/2011  . Chest pain, atypical 08/02/2011  . TIA (transient ischemic attack)   . Barrett esophagus   . Hypertension 03/29/2011    Past Surgical History:  Procedure Laterality Date  . ABDOMINAL HYSTERECTOMY    . CATARACT EXTRACTION W/PHACO Right 07/29/2016   Procedure: CATARACT EXTRACTION PHACO AND INTRAOCULAR LENS PLACEMENT (IOC);  Surgeon: Sherald Hess, MD;  Location: [facility];  Service: Ophthalmology;  Laterality: Right;  right  . CATARACT EXTRACTION W/PHACO Left 07/02/2017   Procedure: CATARACT EXTRACTION PHACO AND INTRAOCULAR LENS PLACEMENT (IOC);  Surgeon: Nevada Crane, MD;  Location: [facility];  Service: Ophthalmology;  Laterality:  Left;  [place] 00:50.9 AP% 14.8 CDE 7.54 Fluid Pack Lot # [id number]  . CESAREAN SECTION    . HERNIA REPAIR  2008   left inguinal  . THYROID SURGERY  june 18   nodule removed    Prior to Admission medications   Medication Sig Start Date End Date Taking? Authorizing Provider  amLODipine  (NORVASC) 5 MG tablet TAKE 1 TABLET BY MOUTH DAILY. Patient taking differently: Take 2 tablets ( ) by mouth daily 06/11/16   Sherlene Shams, MD  atenolol (TENORMIN) 25 MG tablet TAKE 1 TABLET TWICE A DAY 12/21/15   Sherlene Shams, MD  atorvastatin (LIPITOR) 20 MG tablet TAKE 1 TABLET DAILY 07/14/15   Sherlene Shams, MD  azelastine (ASTELIN) 0.1 % nasal spray Place 1 spray into both nostrils 2 (two) times daily. Use in each nostril as directed    [provider]  brimonidine (ALPHAGAN) 0.15 % ophthalmic solution 1 drop 3 (three) times daily.    [provider]  cloNIDine (CATAPRES) 0.1 MG tablet Take 0.1 mg by mouth 2 (two) times daily.  12/17/17   [provider]  esomeprazole (NEXIUM) 40 MG capsule TAKE 1 CAPSULE DAILY BEFORE BREAKFAST 04/01/16   Sherlene Shams, MD  furosemide (LASIX) 20 MG tablet Take 20 mg by mouth daily as needed for edema. 09/02/18 03/01/19  [provider]  latanoprost (XALATAN) 0.005 % ophthalmic solution Place 1 drop into both eyes at bedtime.     [provider]  levothyroxine (SYNTHROID, LEVOTHROID) 75 MCG tablet Take 75 mcg daily before breakfast by mouth.    [provider]  lisinopril (PRINIVIL,ZESTRIL) 20 MG tablet Take 20 mg by mouth 2 (two) times daily.    [provider]  sucralfate (CARAFATE) 1 g tablet Take 1 tablet (1 g total) by mouth 2 (two) times daily. 08/28/15   Sherlene Shams, MD  XARELTO 15 MG TABS tablet TAKE 1 TABLET DAILY 11/06/15   Sherlene Shams, MD    Allergies Neomycin  Family History  Problem Relation Age of Onset  . Hypertension Mother   . Stroke Mother   . Heart disease Father 64       AMI  . Hypertension Father   . Vision loss Paternal Aunt   . Cancer Neg Hx     Social History Social History   Tobacco Use  . Smoking status: Never Smoker  . Smokeless tobacco: Never Used  Substance Use Topics  . Alcohol use: No  . Drug use: No    Review of  Systems  Constitutional: No fever. Eyes: No redness. ENT: No neck pain. Cardiovascular: Positive for resolved chest discomfort. Respiratory: Denies shortness of breath. Gastrointestinal: No nausea, no vomiting.  Genitourinary: Negative for flank pain.  Musculoskeletal: Negative for back pain. Skin: Negative for rash. Neurological: Negative for headache.   ____________________________________________   PHYSICAL EXAM:  VITAL SIGNS: ED Triage Vitals [12/25/18 2001]  Enc Vitals Group     BP (!) 190/84     Pulse Rate 64     Resp 18     Temp 98.2 F (36.8 C)     Temp Source Oral     SpO2 99 %     Weight 115 lb (52.2 kg)     Height  (1.676 m)     Head Circumference      Peak Flow      Pain Score 0     Pain Loc      Pain  Edu?      Excl. in GC?     Constitutional: Alert and oriented.  Frail but relatively well-appearing, in no acute distress. Eyes: Conjunctivae are normal.  Head: Atraumatic. Nose: No congestion/rhinnorhea. Mouth/Throat: Mucous membranes are moist.   Neck: Normal range of motion.  Cardiovascular: Normal rate, regular rhythm. Grossly normal heart sounds.  Good peripheral circulation.  Chest wall nontender. Respiratory: Normal respiratory effort.  No retractions. Lungs CTAB. Gastrointestinal: Soft and nontender. No distention.  Genitourinary: No flank tenderness. Musculoskeletal: Extremities warm and well perfused.  Neurologic:  Normal speech and language. No gross focal neurologic deficits are appreciated.  Skin:  Skin is warm and dry. No rash noted. Psychiatric: Mood and affect are normal. Speech and behavior are normal.  ____________________________________________   LABS (all labs ordered are listed, but only abnormal results are displayed)  Labs Reviewed  COMPREHENSIVE METABOLIC PANEL - Abnormal; Notable for the following components:      Result Value   Glucose, Bld 184 (*)    Creatinine, Ser 1.13 (*)    GFR calc non Af Amer 43 (*)     GFR calc Af Amer 50 (*)    All other components within normal limits  CBC  TROPONIN I  TROPONIN I   ____________________________________________  EKG  ED ECG REPORT I, Dionne BucySebastian Loui Massenburg, the attending physician, personally viewed and interpreted this ECG.  Date: 12/25/2018 EKG Time: 1958 Rate: 64 Rhythm: Atrial fibrillation QRS Axis: normal Intervals: normal ST/T Wave abnormalities: normal Narrative Interpretation: no evidence of acute ischemia; no significant change when compared to EKG of 12/20/2018  ____________________________________________  RADIOLOGY  CXR: No focal infiltrate or other acute abnormality  ____________________________________________   PROCEDURES  Procedure(s) performed: No  Procedures  Critical Care performed: No ____________________________________________   INITIAL IMPRESSION / ASSESSMENT AND PLAN / ED COURSE  Pertinent labs & imaging results that were available during my care of the patient were reviewed by me and considered in my medical decision making (see chart for details).  85 year old female with a history of atrial fibrillation and other PMH as noted above presents with atypical right-sided chest discomfort acute onset more than 12 hours ago and which resolved after about 20 seconds.  She reports doing some increased housework recently but was not exerting herself at the time that the symptom happened.  She has been feeling fine since then and states that she came in in the evening because she was debating all day with her husband whether to come in.  I reviewed the past medical records in Epic.  The patient was most recently seen in the ED for evaluation of possible visual hallucinations.  The patient does have a history of CAD although no recent MI.  On exam she is slightly frail but relatively well appearing for her age.  She is hypertensive with otherwise normal vital signs.  The remainder of the exam is unremarkable.  EKG is  nonischemic and shows no significant change since her most recent EKG.  Overall given the very atypical and fleeting nature of the discomfort, I have an extremely low suspicion for any cardiac etiology.  I suspect most likely neuropathic etiology or possibly musculoskeletal cause.  Initial lab work-up and x-ray were within normal limits including a troponin.  Although it has been well over 6 hours since the pain, I think it would be reasonable to check a second troponin and if this remains negative the patient will be stable for discharge home.  The patient herself feels comfortable  and would like to go home if the work-up shows no concerning findings.  ----------------------------------------- 11:16 PM on 12/25/2018 -----------------------------------------  Repeat troponin is pending.  I am signing the patient out to the oncoming physician Dr. Manson Passey. ____________________________________________   FINAL CLINICAL IMPRESSION(S) / ED DIAGNOSES  Final diagnoses:  Chest wall discomfort      NEW MEDICATIONS STARTED DURING THIS VISIT:  New Prescriptions   No medications on file     Note:  This document was prepared using Dragon voice recognition software and may include unintentional dictation errors.     Dionne Bucy, MD 12/25/18 2317

## 2018-12-26 NOTE — ED Notes (Signed)
Spoke with husband via telephone and let him know patient is ready to be discharged. States he will be here in about 10 mins.

## 2019-03-05 ENCOUNTER — Ambulatory Visit (INDEPENDENT_AMBULATORY_CARE_PROVIDER_SITE_OTHER): Payer: Medicare Other | Admitting: Vascular Surgery

## 2019-03-05 ENCOUNTER — Encounter (INDEPENDENT_AMBULATORY_CARE_PROVIDER_SITE_OTHER): Payer: Medicare Other

## 2019-10-04 ENCOUNTER — Emergency Department
Admission: EM | Admit: 2019-10-04 | Discharge: 2019-10-04 | Disposition: A | Discharge status: Home / Self Care | Payer: Medicare Other | Attending: Emergency Medicine | Admitting: Emergency Medicine

## 2019-10-04 ENCOUNTER — Emergency Department: Payer: Medicare Other

## 2019-10-04 ENCOUNTER — Other Ambulatory Visit: Payer: Self-pay

## 2019-10-04 ENCOUNTER — Encounter: Payer: Self-pay | Admitting: Emergency Medicine

## 2019-10-04 DIAGNOSIS — I129 Hypertensive chronic kidney disease with stage 1 through stage 4 chronic kidney disease, or unspecified chronic kidney disease: Secondary | ICD-10-CM | POA: Diagnosis not present

## 2019-10-04 DIAGNOSIS — E039 Hypothyroidism, unspecified: Secondary | ICD-10-CM | POA: Insufficient documentation

## 2019-10-04 DIAGNOSIS — R296 Repeated falls: Secondary | ICD-10-CM | POA: Diagnosis not present

## 2019-10-04 DIAGNOSIS — M25551 Pain in right hip: Secondary | ICD-10-CM | POA: Diagnosis present

## 2019-10-04 DIAGNOSIS — N183 Chronic kidney disease, stage 3 unspecified: Secondary | ICD-10-CM | POA: Diagnosis not present

## 2019-10-04 DIAGNOSIS — E1122 Type 2 diabetes mellitus with diabetic chronic kidney disease: Secondary | ICD-10-CM | POA: Insufficient documentation

## 2019-10-04 DIAGNOSIS — I2510 Atherosclerotic heart disease of native coronary artery without angina pectoris: Secondary | ICD-10-CM | POA: Diagnosis not present

## 2019-10-04 MED ORDER — ACETAMINOPHEN 325 MG PO TABS: 650.0000 mg | ORAL_TABLET | Freq: Three times a day (TID) | ORAL | 0 refills | Status: AC | PRN

## 2019-10-04 MED ORDER — POLYETHYLENE GLYCOL 3350 17 G PO PACK: 17.0000 g | PACK | Freq: Every day | ORAL | 0 refills | Status: DC

## 2019-10-04 MED ORDER — ACETAMINOPHEN 500 MG PO TABS
1000.0000 mg | ORAL_TABLET | Freq: Once | ORAL | Status: AC
  Administered 2019-10-04: 1000 mg via ORAL
  Filled 2019-10-04: qty 2

## 2019-10-04 NOTE — ED Notes (Addendum)
Pt ambulatory to bathroom w/ standby assist; no difficulty noted.

## 2019-10-04 NOTE — ED Triage Notes (Signed)
Pt in via [facility] from home, reports fall a couple of weeks ago, having increasing pain to right hip since.  NAD noted at this time.

## 2019-10-04 NOTE — ED Notes (Signed)
SW at bedside.

## 2019-10-04 NOTE — TOC Progression Note (Addendum)
Transition of Care [facility]) - Progression Note    Patient Details  Name: Tiffany Grimes MRN: [id number] Date of Birth: [birth year]  [facility]) CM/SW Contact  Curtice Cellar, RN Phone Number: 10/04/2019, 11:38 AM  Clinical Narrative:    Call to [facility], [place] @ [place], and [place] @ [place] and no ability to accept patient for services. [facility] @ [address] and [place] [place] with [facility] unable to accomodate. RN CM discussed with MD and patient inability to schedule HHC services.    Expected Discharge Plan: Home w Home Health Services    Expected Discharge Plan and Services Expected Discharge Plan: Home w Home Health Services       Living arrangements for the past 2 months: Single Family Home                   DME Agency: [employer] Date DME Agency Contacted: 10/04/19 Time DME Agency Contacted: 1130               Social Determinants of Health (SDOH) Interventions    Readmission Risk Interventions No flowsheet data found.

## 2019-10-04 NOTE — ED Notes (Signed)
Pt transported to CT ?

## 2019-10-04 NOTE — ED Notes (Signed)
Pt transported to Xray. 

## 2019-10-04 NOTE — ED Provider Notes (Signed)
[facility] Emergency Department Provider Note       Time seen: ----------------------------------------- 7:43 AM on 10/04/2019 -----------------------------------------   I have reviewed the triage vital signs and the nursing notes.  HISTORY   Chief Complaint No chief complaint on file.    HPI Tiffany Grimes is a 86 y.o. female with a history of atrial fibrillation, hypertension, hypothyroidism, TIA who presents to the ED for right hip pain.  Patient arrives by EMS from home.  Reports several falls over the past several years.  Fell several weeks ago.  She reports doing some yard work which may have aggravated the right hip pain.  Does not currently have any pain.  Past Medical History:  Diagnosis Date  . Atrial fibrillation (HCC)   . Atrial fibrillation (HCC)   . Barrett esophagus    due for EGD July 2012, [place]  . Dysrhythmia   . Hx of thyroid nodule July 2012   benign biopsy   . Hypertension   . Hypothyroidism   . Lentigo   . Reflux   . Thyroid disease   . TIA (transient ischemic attack) June 2012  . TIA (transient ischemic attack) June 2012  . Wears dentures    partial upper  . Wears hearing aid     Patient Active Problem List   Diagnosis Date Noted  . Coronary artery disease 07/21/2018  . History of TIA (transient ischemic attack) 07/21/2018  . Pulmonary hypertension (HCC) 07/21/2018  . Type 2 diabetes mellitus with stage 3 chronic kidney disease, without long-term current use of insulin (HCC) 05/06/2018  . Mild protein-calorie malnutrition (HCC) 02/10/2018  . Carotid artery stenosis 02/28/2017  . Alpha-1-antitrypsin deficiency (HCC) 10/19/2015  . History of depression 10/19/2015  . Underweight 07/30/2015  . Furrowed tongue 07/30/2015  . Benign essential HTN 05/11/2014  . Severe mitral insufficiency 05/11/2014  . Centrilobular emphysema (HCC) 04/18/2014  . Alpha-1-antitrypsin deficiency carrier 04/18/2014  . Unspecified  hypothyroidism 04/07/2014  . Multiple pulmonary nodules 10/05/2013  . Pulmonary nodules/lesions, multiple 08/31/2013  . PAD (peripheral artery disease) (HCC) 08/08/2013  . External hemorrhoid 08/02/2013  . Anal fissure 08/02/2013  . Ecchymosis 05/10/2013  . Anxiety state 10/28/2012  . Routine general medical examination at a health care facility 06/15/2012  . 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 Osteoporosis, post-menopausal 02/11/2012  . Lumbago with sciatica of right side 02/11/2012  . Posterior neck pain 02/11/2012  . Hyperlipidemia LDL goal <100 12/16/2011  . Hx of thyroid nodule   . Atrial fibrillation, new onset (HCC) 08/02/2011  . Chest pain, atypical 08/02/2011  . TIA (transient ischemic attack)   . Barrett esophagus   . Hypertension 03/29/2011    Past Surgical History:  Procedure Laterality Date  . ABDOMINAL HYSTERECTOMY    . CATARACT EXTRACTION W/PHACO Right 07/29/2016   Procedure: CATARACT EXTRACTION PHACO AND INTRAOCULAR LENS PLACEMENT (IOC);  Surgeon: Sherald Hess, MD;  Location: [facility];  Service: Ophthalmology;  Laterality: Right;  right  . CATARACT EXTRACTION W/PHACO Left 07/02/2017   Procedure: CATARACT EXTRACTION PHACO AND INTRAOCULAR LENS PLACEMENT (IOC);  Surgeon: Nevada Crane, MD;  Location: [facility];  Service: Ophthalmology;  Laterality: Left;  [place] 00:50.9 AP% 14.8 CDE 7.54 Fluid Pack Lot # [id number]  . CESAREAN SECTION    . HERNIA REPAIR  2008   left inguinal  . THYROID SURGERY  june 18   nodule removed    Allergies Neomycin  Social History Social History   Tobacco Use  .  Smoking status: Never Smoker  . Smokeless tobacco: Never Used  Substance Use Topics  . Alcohol use: No  . Drug use: No    Review of Systems Constitutional: Negative for fever. Cardiovascular: Negative for chest pain. Respiratory: Negative for shortness of breath. Gastrointestinal: Negative for abdominal pain, vomiting and  diarrhea. Musculoskeletal: Positive for right hip pain Skin: Negative for rash. Neurological: Negative for headaches, focal weakness or numbness.  All systems negative/normal/unremarkable except as stated in the HPI  ____________________________________________   PHYSICAL EXAM:  VITAL SIGNS: ED Triage Vitals  Enc Vitals Group     BP      Pulse      Resp      Temp      Temp src      SpO2      Weight      Height      Head Circumference      Peak Flow      Pain Score      Pain Loc      Pain Edu?      Excl. in GC?     Constitutional: Alert and oriented. Well appearing and in no distress. Eyes: Conjunctivae are normal. Normal extraocular movements. Cardiovascular: Normal rate, regular rhythm. No murmurs, rubs, or gallops. Respiratory: Normal respiratory effort without tachypnea nor retractions. Breath sounds are clear and equal bilaterally. No wheezes/rales/rhonchi. Musculoskeletal: Nontender with normal range of motion in extremities. No lower extremity tenderness nor edema.  No obvious pain with range of motion of the right hip Neurologic:  Normal speech and language. No gross focal neurologic deficits are appreciated.  Skin:  Skin is warm, dry and intact. No rash noted. Psychiatric: Mood and affect are normal. Speech and behavior are normal.  ____________________________________________  ED COURSE:  As part of my medical decision making, I reviewed the following data within the electronic MEDICAL RECORD NUMBER History obtained from family if available, nursing notes, old chart and ekg, as well as notes from prior ED visits. Patient presented for right hip pain, we will assess with imaging as indicated at this time.   Procedures  Tiffany Grimes was evaluated in Emergency Department on 10/04/2019 for the symptoms described in the history of present illness. She was evaluated in the context of the global COVID-19 pandemic, which necessitated consideration that the patient might be  at risk for infection with the SARS-CoV-2 virus that causes COVID-19. Institutional protocols and algorithms that pertain to the evaluation of patients at risk for COVID-19 are in a state of rapid change based on information released by regulatory bodies including the CDC and federal and state organizations. These policies and algorithms were followed during the patient's care in the ED.  ____________________________________________   RADIOLOGY Images were viewed by me  Right hip x-rays IMPRESSION:  No acute bony abnormality. No evidence of hip fracture.   Moderate stool burden throughout the colon.  IMPRESSION:  No acute osseous abnormality.   Colonic stool burden suggests constipation.  ____________________________________________   DIFFERENTIAL DIAGNOSIS   Arthritis, bursitis, fracture  FINAL ASSESSMENT AND PLAN  Right hip pain, constipation   Plan: The patient had presented for right hip pain.  Patient's imaging did not reveal any acute process, she does have some arthritis and likely has bursitis from recent increase activity and bending.  I will encourage Tylenol for pain.  She is also constipated and will be courage to take MiraLAX for same.  We have arranged for her to use a walker and  to have home physical therapy.   Laurence Aly, MD    Note: This note was generated in part or whole with voice recognition software. Voice recognition is usually quite accurate but there are transcription errors that can and very often do occur. I apologize for any typographical errors that were not detected and corrected.     Earleen Newport, MD 10/04/19 1436

## 2019-10-04 NOTE — TOC Initial Note (Signed)
Transition of Care [facility]) - Initial/Assessment Note    Patient Details  Name: Tiffany Grimes MRN: [id number] Date of Birth: Oct 18, 1928  [facility] CM/SW Contact:    [place] Cellar, RN Phone Number: 10/04/2019, 11:31 AM  Clinical Narrative:                 Spoke with patient and discussed request for home health for PT. Patient hopeful PT will help her get stronger and more steady on her feet. Does not drive-husband drives her to appointments. Patient very hard of hearing and has partial dentures.   Expected Discharge Plan: Home w Home Health Services     Patient Goals and CMS Choice Patient states their goals for this hospitalization and ongoing recovery are:: get back home      Expected Discharge Plan and Services Expected Discharge Plan: Home w Home Health Services       Living arrangements for the past 2 months: Single Family Home                   DME Agency: [employer] Date DME Agency Contacted: 10/04/19 Time DME Agency Contacted: 1130              Prior Living Arrangements/Services Living arrangements for the past 2 months: Single Family Home Lives with:: Spouse Patient language and need for interpreter reviewed:: Yes Do you feel safe going back to the place where you live?: Yes      Need for Family Participation in Patient Care: No (Comment) Care giver support system in place?: Yes (comment) Current home services: DME(received walker) Criminal Activity/Legal Involvement Pertinent to Current Situation/Hospitalization: No - Comment as needed  Activities of Daily Living      Permission Sought/Granted Permission sought to share information with : Facility Industrial/product designer granted to share information with : Yes, Verbal Permission Granted     Permission granted to share info w AGENCY: Home Health agencies        Emotional Assessment Appearance:: Appears stated age Attitude/Demeanor/Rapport: Engaged Affect (typically  observed): Accepting Orientation: : Oriented to Self, Oriented to Place, Oriented to  Time, Oriented to Situation Alcohol / Substance Use: Never Used Psych Involvement: No (comment)  Admission diagnosis:  hip pain Patient Active Problem List   Diagnosis Date Noted  . Coronary artery disease 07/21/2018  . History of TIA (transient ischemic attack) 07/21/2018  . Pulmonary hypertension (HCC) 07/21/2018  . Type 2 diabetes mellitus with stage 3 chronic kidney disease, without long-term current use of insulin (HCC) 05/06/2018  . Mild protein-calorie malnutrition (HCC) 02/10/2018  . Carotid artery stenosis 02/28/2017  . Alpha-1-antitrypsin deficiency (HCC) 10/19/2015  . History of depression 10/19/2015  . Underweight 07/30/2015  . Furrowed tongue 07/30/2015  . Benign essential HTN 05/11/2014  . Severe mitral insufficiency 05/11/2014  . Centrilobular emphysema (HCC) 04/18/2014  . Alpha-1-antitrypsin deficiency carrier 04/18/2014  . Unspecified hypothyroidism 04/07/2014  . Multiple pulmonary nodules 10/05/2013  . Pulmonary nodules/lesions, multiple 08/31/2013  . PAD (peripheral artery disease) (HCC) 08/08/2013  . External hemorrhoid 08/02/2013  . Anal fissure 08/02/2013  . Ecchymosis 05/10/2013  . Anxiety state 10/28/2012  . Routine general medical examination at a health care facility 06/15/2012  . 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 Osteoporosis, post-menopausal 02/11/2012  . Lumbago with sciatica of right side 02/11/2012  . Posterior neck pain 02/11/2012  . Hyperlipidemia LDL goal <100 12/16/2011  . Hx of thyroid nodule   . Atrial fibrillation, new onset (HCC)  08/02/2011  . Chest pain, atypical 08/02/2011  . TIA (transient ischemic attack)   . Barrett esophagus   . Hypertension 03/29/2011   PCP:  Kirk Ruths, MD Pharmacy:   [id number] - [place], [place] [address] [place] [address] Phone: [phone] Fax:  [phone]  [facility] [place], [place] [address] Phone: [phone] Fax: [phone]  [facility] [place], [place] [address] [place] [address] Phone: [phone] Fax: [phone]     Social Determinants of Health (SDOH) Interventions    Readmission Risk Interventions No flowsheet data found.

## 2019-11-13 IMAGING — CR DG CHEST 2V
2 series · 2 of 2 positions shown · non-contrast
Comparison: 11/19/2017.

CLINICAL DATA: Hypertension, tachycardia.

EXAM:
CHEST - 2 VIEW

[chest pa]
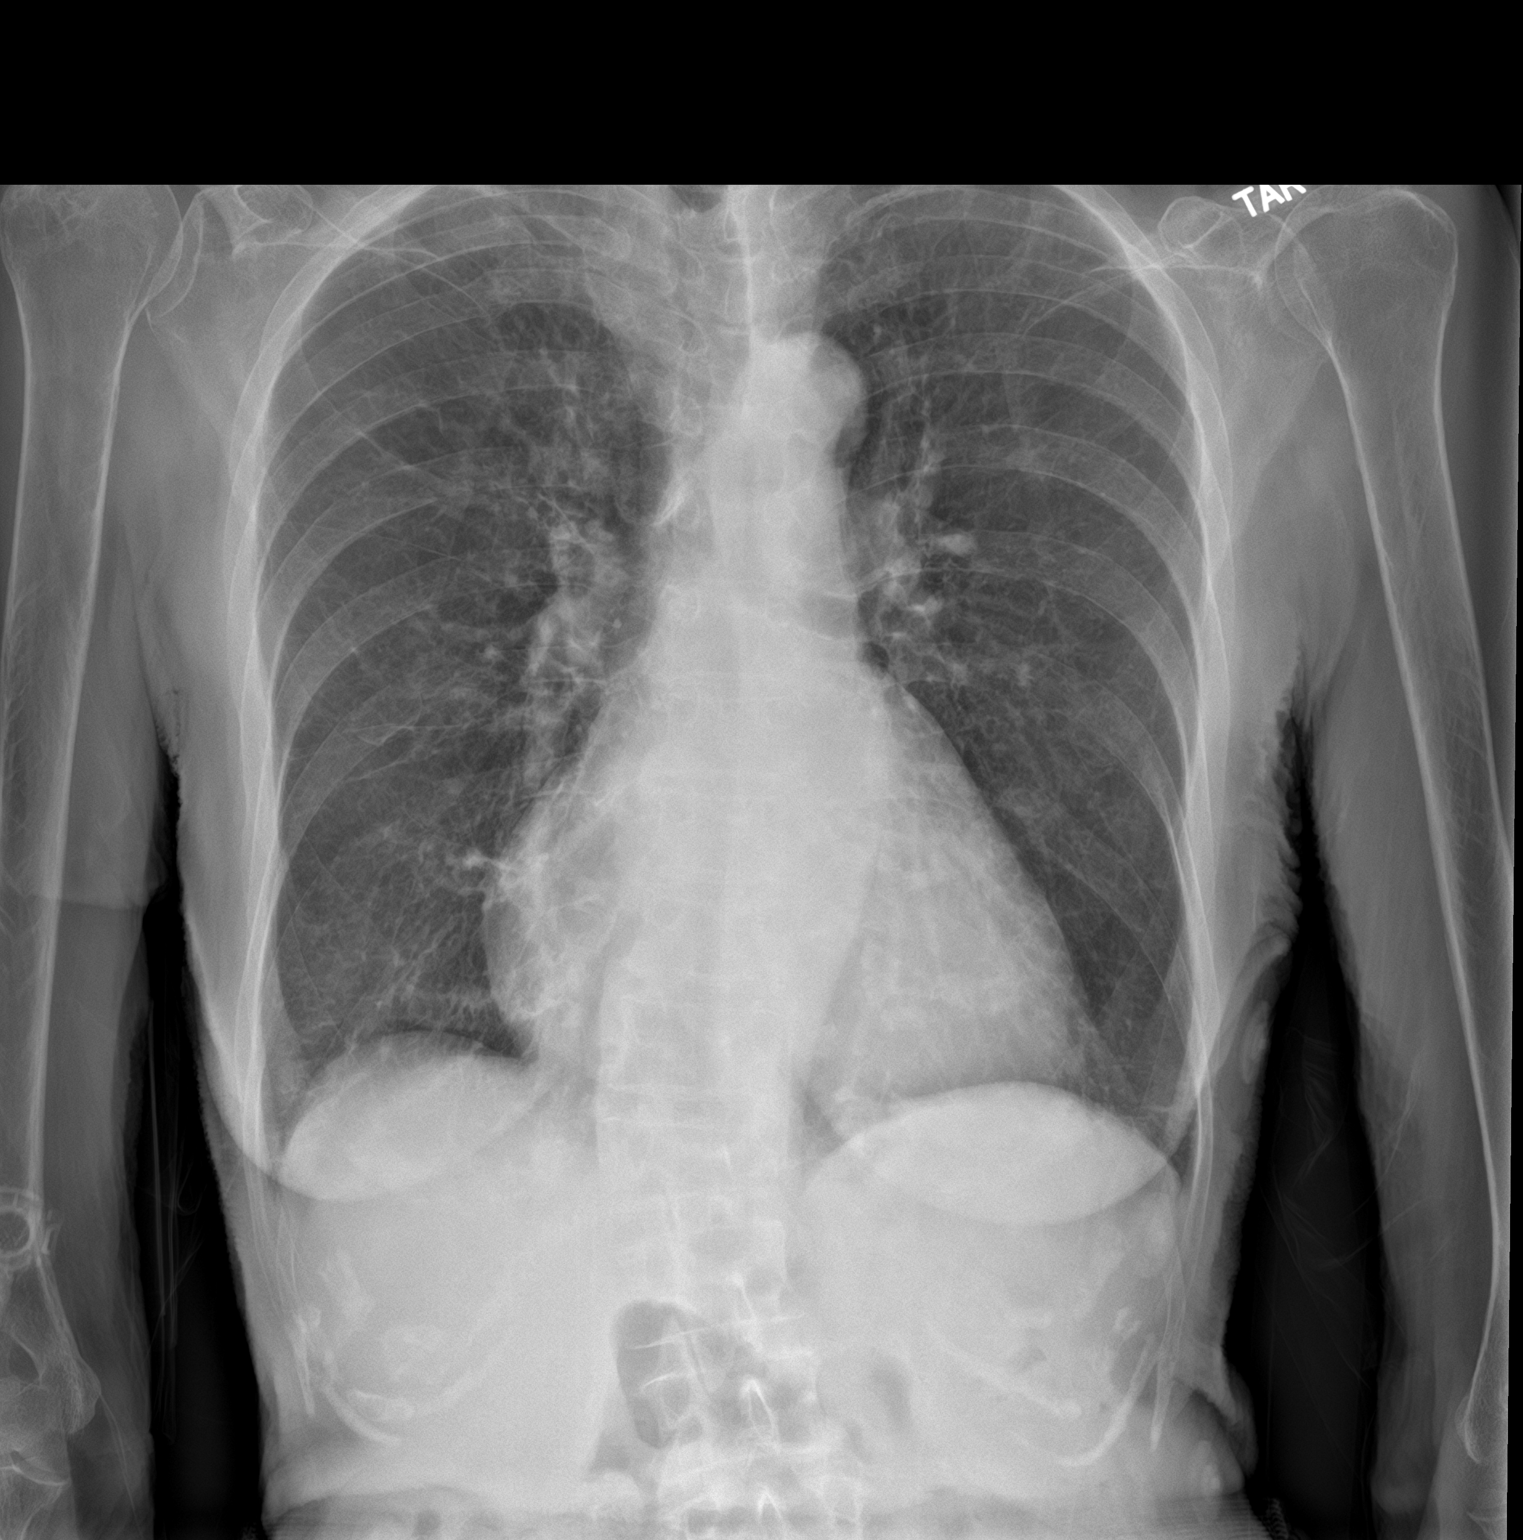

[chest lat]
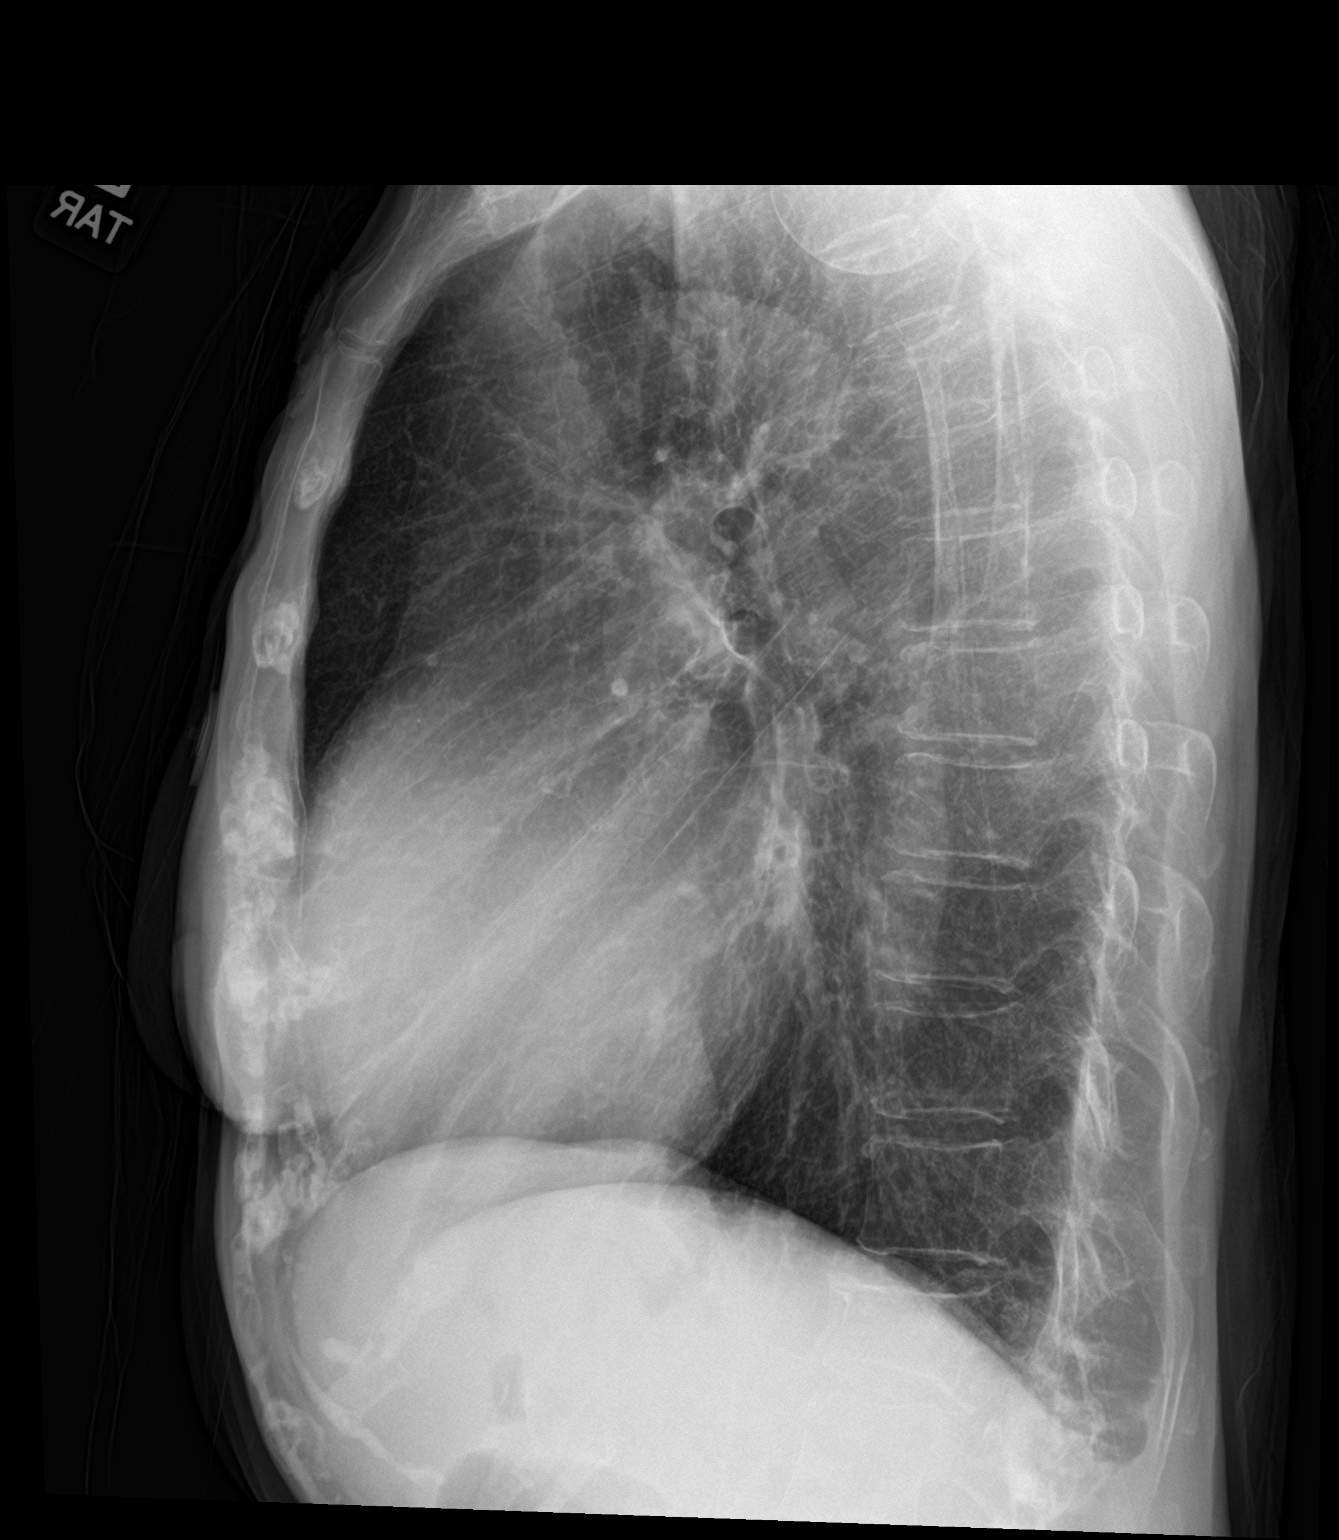

[2 of 2 positions shown; findings below may reference images not displayed]

FINDINGS: Trachea is midline. Heart is enlarged. Thoracic aorta is calcified.
Lungs are hyperinflated but clear. No pleural fluid.

Scoliosis.
IMPRESSION: 1. Hyperinflation without acute finding.
2.  Aortic atherosclerosis ([id number]-170.0).

## 2020-07-18 ENCOUNTER — Encounter (INDEPENDENT_AMBULATORY_CARE_PROVIDER_SITE_OTHER): Payer: Medicare Other

## 2020-07-18 ENCOUNTER — Ambulatory Visit (INDEPENDENT_AMBULATORY_CARE_PROVIDER_SITE_OTHER): Payer: Medicare Other | Admitting: Vascular Surgery

## 2021-03-31 ENCOUNTER — Emergency Department
Admission: EM | Admit: 2021-03-31 | Discharge: 2021-03-31 | Disposition: A | Discharge status: Home / Self Care | Payer: Medicare Other | Attending: Emergency Medicine | Admitting: Emergency Medicine

## 2021-03-31 ENCOUNTER — Emergency Department: Payer: Medicare Other

## 2021-03-31 DIAGNOSIS — E039 Hypothyroidism, unspecified: Secondary | ICD-10-CM | POA: Insufficient documentation

## 2021-03-31 DIAGNOSIS — N183 Chronic kidney disease, stage 3 unspecified: Secondary | ICD-10-CM | POA: Insufficient documentation

## 2021-03-31 DIAGNOSIS — I2510 Atherosclerotic heart disease of native coronary artery without angina pectoris: Secondary | ICD-10-CM | POA: Insufficient documentation

## 2021-03-31 DIAGNOSIS — E1122 Type 2 diabetes mellitus with diabetic chronic kidney disease: Secondary | ICD-10-CM | POA: Insufficient documentation

## 2021-03-31 DIAGNOSIS — I4891 Unspecified atrial fibrillation: Secondary | ICD-10-CM | POA: Insufficient documentation

## 2021-03-31 DIAGNOSIS — R220 Localized swelling, mass and lump, head: Secondary | ICD-10-CM | POA: Insufficient documentation

## 2021-03-31 DIAGNOSIS — Z7901 Long term (current) use of anticoagulants: Secondary | ICD-10-CM | POA: Insufficient documentation

## 2021-03-31 DIAGNOSIS — I129 Hypertensive chronic kidney disease with stage 1 through stage 4 chronic kidney disease, or unspecified chronic kidney disease: Secondary | ICD-10-CM | POA: Insufficient documentation

## 2021-03-31 DIAGNOSIS — W19XXXA Unspecified fall, initial encounter: Secondary | ICD-10-CM | POA: Insufficient documentation

## 2021-03-31 DIAGNOSIS — Z79899 Other long term (current) drug therapy: Secondary | ICD-10-CM | POA: Insufficient documentation

## 2021-03-31 LAB — HEPATIC FUNCTION PANEL
ALT: 20 U/L (ref 0–44)
AST: 33 U/L (ref 15–41)
Albumin: 4.6 g/dL (ref 3.5–5.0)
Alkaline Phosphatase: 76 U/L (ref 38–126)
Bilirubin, Direct: 0.3 mg/dL — ABNORMAL HIGH (ref 0.0–0.2)
Indirect Bilirubin: 1 mg/dL — ABNORMAL HIGH (ref 0.3–0.9)
Total Bilirubin: 1.3 mg/dL — ABNORMAL HIGH (ref 0.3–1.2)
Total Protein: 7.2 g/dL (ref 6.5–8.1)

## 2021-03-31 LAB — CBC WITH DIFFERENTIAL/PLATELET
Abs Immature Granulocytes: 0.03 10*3/uL (ref 0.00–0.07)
Basophils Absolute: 0 10*3/uL (ref 0.0–0.1)
Basophils Relative: 0 %
Eosinophils Absolute: 0.1 10*3/uL (ref 0.0–0.5)
Eosinophils Relative: 1 %
HCT: 44.4 % (ref 36.0–46.0)
Hemoglobin: 14.8 g/dL (ref 12.0–15.0)
Immature Granulocytes: 0 %
Lymphocytes Relative: 13 %
Lymphs Abs: 1 10*3/uL (ref 0.7–4.0)
MCH: 30 pg (ref 26.0–34.0)
MCHC: 33.3 g/dL (ref 30.0–36.0)
MCV: 89.9 fL (ref 80.0–100.0)
Monocytes Absolute: 0.7 10*3/uL (ref 0.1–1.0)
Monocytes Relative: 8 %
Neutro Abs: 6.2 10*3/uL (ref 1.7–7.7)
Neutrophils Relative %: 78 %
Platelets: 197 10*3/uL (ref 150–400)
RBC: 4.94 MIL/uL (ref 3.87–5.11)
RDW: 13.3 % (ref 11.5–15.5)
WBC: 8 10*3/uL (ref 4.0–10.5)
nRBC: 0 % (ref 0.0–0.2)

## 2021-03-31 LAB — BASIC METABOLIC PANEL
Anion gap: 10 (ref 5–15)
BUN: 16 mg/dL (ref 8–23)
CO2: 29 mmol/L (ref 22–32)
Calcium: 9.2 mg/dL (ref 8.9–10.3)
Chloride: 98 mmol/L (ref 98–111)
Creatinine, Ser: 0.81 mg/dL (ref 0.44–1.00)
GFR, Estimated: >60 mL/min (ref >60)
Glucose, Bld: 119 mg/dL — ABNORMAL HIGH (ref 70–99)
Potassium: 4.3 mmol/L (ref 3.5–5.1)
Sodium: 137 mmol/L (ref 135–145)

## 2021-03-31 LAB — URINALYSIS, COMPLETE (UACMP) WITH MICROSCOPIC
Bilirubin Urine: NEGATIVE
Glucose, UA: NEGATIVE mg/dL
Ketones, ur: NEGATIVE mg/dL
Nitrite: NEGATIVE
Protein, ur: NEGATIVE mg/dL
Specific Gravity, Urine: 1.02 (ref 1.005–1.030)
pH: 7 (ref 5.0–8.0)

## 2021-03-31 LAB — CK: Total CK: 482 U/L — ABNORMAL HIGH (ref 38–234)

## 2021-03-31 LAB — TROPONIN I (HIGH SENSITIVITY): Troponin I (High Sensitivity): 11 ng/L (ref <18)

## 2021-03-31 NOTE — ED Notes (Signed)
Pt to CT

## 2021-03-31 NOTE — ED Notes (Signed)
E-signature pad unavailable - Pt verbalized understanding of D/C information - no additional concerns at this time.  

## 2021-03-31 NOTE — ED Triage Notes (Signed)
Unwitnessed fall at [facility] found on floor unknown time down alert upon arrival. On Xarelto has knot to back of head. Denies pain.

## 2021-03-31 NOTE — Discharge Instructions (Addendum)
There are no apparent injuries.  Patient can go home.  I have included fall prevention instructions.

## 2021-03-31 NOTE — ED Notes (Signed)
Lab contacted about add on labs

## 2021-03-31 NOTE — ED Notes (Signed)
Pts soiled pants and underwear placed in a pt belongings bag. Patient provided a clean brief and paper scrub pants.

## 2021-03-31 NOTE — ED Provider Notes (Signed)
[facility] Emergency Department Provider Note   ____________________________________________   Event Date/Time   First MD Initiated Contact with Patient 03/31/21 1809     (approximate)  I have reviewed the triage vital signs and the nursing notes.   HISTORY  Chief Complaint Fall (Patient was found on floor in memory care unit at [facility], last seen walking at 1300, has knot on back of head on Xarelto)    HPI Tiffany Grimes is a 87 y.o. female who says she was walking on the floor and her foot stuck to the floor and in the tennis shoe she was in and she fell.  She does not know how long she was on the floor.  No one can tell me.  She does take Xarelto and has a small knot on the back of her head.  She does not think she passed out.  She says her head and neck and chest and arms and legs do not hurt.  Her belly does not hurt either.     Past Medical History:  Diagnosis Date   Atrial fibrillation [facility])    Atrial fibrillation (HCC)    Barrett esophagus    due for EGD July 2012, [place]   Dysrhythmia    Hx of thyroid nodule July 2012   benign biopsy    Hypertension    Hypothyroidism    Lentigo    Reflux    Thyroid disease    TIA (transient ischemic attack) June 2012   TIA (transient ischemic attack) June 2012   Wears dentures    partial upper   Wears hearing aid     Patient Active Problem List   Diagnosis Date Noted   Coronary artery disease 07/21/2018   History of TIA (transient ischemic attack) 07/21/2018   Pulmonary hypertension (HCC) 07/21/2018   Type 2 diabetes mellitus with stage 3 chronic kidney disease, without long-term current use of insulin (HCC) 05/06/2018   Mild protein-calorie malnutrition (HCC) 02/10/2018   Carotid artery stenosis 02/28/2017   Alpha-1-antitrypsin deficiency (HCC) 10/19/2015   History of depression 10/19/2015   Underweight 07/30/2015   Furrowed tongue 07/30/2015   Benign essential HTN 05/11/2014   Severe  mitral insufficiency 05/11/2014   Centrilobular emphysema (HCC) 04/18/2014   Alpha-1-antitrypsin deficiency carrier 04/18/2014   Unspecified hypothyroidism 04/07/2014   Multiple pulmonary nodules 10/05/2013   Pulmonary nodules/lesions, multiple 08/31/2013   PAD (peripheral artery disease) (HCC) 08/08/2013   External hemorrhoid 08/02/2013   Anal fissure 08/02/2013   Ecchymosis 05/10/2013   Anxiety state 10/28/2012   Routine general medical examination at a health care facility 06/15/2012   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   Osteoporosis, post-menopausal 02/11/2012   Lumbago with sciatica of right side 02/11/2012   Posterior neck pain 02/11/2012   Hyperlipidemia LDL goal <100 12/16/2011   Hx of thyroid nodule    Atrial fibrillation, new onset (HCC) 08/02/2011   Chest pain, atypical 08/02/2011   TIA (transient ischemic attack)    Barrett esophagus    Hypertension 03/29/2011    Past Surgical History:  Procedure Laterality Date   ABDOMINAL HYSTERECTOMY     CATARACT EXTRACTION W/PHACO Right 07/29/2016   Procedure: CATARACT EXTRACTION PHACO AND INTRAOCULAR LENS PLACEMENT (IOC);  Surgeon: Sherald Hess, MD;  Location: [facility];  Service: Ophthalmology;  Laterality: Right;  right   CATARACT EXTRACTION W/PHACO Left 07/02/2017   Procedure: CATARACT EXTRACTION PHACO AND INTRAOCULAR LENS PLACEMENT (IOC);  Surgeon: Nevada Crane, MD;  Location: [facility];  Service: Ophthalmology;  Laterality: Left;  [place] 00:50.9 AP% 14.8 CDE 7.54 Fluid Pack Lot # [id number]   CESAREAN SECTION     HERNIA REPAIR  2008   left inguinal   THYROID SURGERY  june 18   nodule removed    Prior to Admission medications   Medication Sig Start Date End Date Taking? Authorizing Provider  amLODipine (NORVASC) 5 MG tablet TAKE 1 TABLET BY MOUTH DAILY. 06/11/16   Sherlene Shams, MD  atenolol (TENORMIN) 25 MG tablet TAKE 1 TABLET TWICE A DAY Patient not taking: Reported on  10/04/2019 12/21/15   Sherlene Shams, MD  atorvastatin (LIPITOR) 20 MG tablet TAKE 1 TABLET DAILY 07/14/15   Sherlene Shams, MD  azelastine (ASTELIN) 0.1 % nasal spray Place 1 spray into both nostrils 2 (two) times daily. Use in each nostril as directed    [provider]  cloNIDine (CATAPRES) 0.1 MG tablet Take 0.1 mg by mouth 2 (two) times daily.  12/17/17   [provider]  esomeprazole (NEXIUM) 40 MG capsule TAKE 1 CAPSULE DAILY BEFORE BREAKFAST Patient not taking: Reported on 10/04/2019 04/01/16   Sherlene Shams, MD  furosemide (LASIX) 20 MG tablet Take 20 mg by mouth daily as needed for edema. 09/02/18 03/01/19  [provider]  latanoprost (XALATAN) 0.005 % ophthalmic solution Place 1 drop into both eyes at bedtime.     [provider]  levothyroxine (SYNTHROID) 88 MCG tablet Take 88 mcg by mouth daily before breakfast.     [provider]  lisinopril (PRINIVIL,ZESTRIL) 20 MG tablet Take 20 mg by mouth 2 (two) times daily.    [provider]  polyethylene glycol (MIRALAX / GLYCOLAX) 17 g packet Take 17 g by mouth daily. 10/04/19   Emily Filbert, MD  sucralfate (CARAFATE) 1 g tablet Take 1 tablet (1 g total) by mouth 2 (two) times daily. 08/28/15   Sherlene Shams, MD  XARELTO 15 MG TABS tablet TAKE 1 TABLET DAILY 11/06/15   Sherlene Shams, MD    Allergies Neomycin  Family History  Problem Relation Age of Onset   Hypertension Mother    Stroke Mother    Heart disease Father 23       AMI   Hypertension Father    Vision loss Paternal Aunt    Cancer Neg Hx     Social History Social History   Tobacco Use   Smoking status: Never   Smokeless tobacco: Never  Vaping Use   Vaping Use: Never used  Substance Use Topics   Alcohol use: No   Drug use: No    Review of Systems  Constitutional: No fever/chills Eyes: No visual changes. ENT: No sore throat. Cardiovascular: Denies chest pain. Respiratory: Denies shortness of  breath. Gastrointestinal: No abdominal pain.  No nausea, no vomiting.  No diarrhea.  No constipation. Genitourinary: Negative for dysuria. Musculoskeletal: Negative for back pain. Skin: Negative for rash. Neurological: Negative for headaches, focal weakness    ____________________________________________   PHYSICAL EXAM:  VITAL SIGNS: ED Triage Vitals  Enc Vitals Group     BP 03/31/21 1817 (!) 182/88     Pulse Rate 03/31/21 1817 (!) 101     Resp 03/31/21 1817 20     Temp 03/31/21 1817 97.6 F (36.4 C)     Temp Source 03/31/21 1817 Oral     SpO2 03/31/21 1817 98 %     Weight --  Height --      Head Circumference --      Peak Flow --      Pain Score 03/31/21 1818 0     Pain Loc --      Pain Edu? --      Excl. in GC? --     Constitutional: Alert and oriented. Well appearing and in no acute distress. Eyes: Conjunctivae are normal. PER EOMI. Head: Atraumatic except for small lump on the back of her head. Nose: No congestion/rhinnorhea. Mouth/Throat: Mucous membranes are moist.  Oropharynx non-erythematous. Neck: No stridor.   Cardiovascular: Normal rate, regular rhythm. Grossly normal heart sounds.  Good peripheral circulation. Respiratory: Normal respiratory effort.  No retractions. Lungs CTAB. Gastrointestinal: Soft and nontender. No distention. No abdominal bruits. No CVA tenderness. Musculoskeletal: No upper or ower extremity tenderness nor edema.  No back pain to palpation  Neurologic:  Normal speech and language. No gross focal neurologic deficits are appreciated.  Skin:  Skin is warm, dry and intact. No rash noted.   ____________________________________________   LABS (all labs ordered are listed, but only abnormal results are displayed)  Labs Reviewed  BASIC METABOLIC PANEL - Abnormal; Notable for the following components:      Result Value   Glucose, Bld 119 (*)    All other components within normal limits  HEPATIC FUNCTION PANEL - Abnormal; Notable  for the following components:   Total Bilirubin 1.3 (*)    Bilirubin, Direct 0.3 (*)    Indirect Bilirubin 1.0 (*)    All other components within normal limits  URINALYSIS, COMPLETE (UACMP) WITH MICROSCOPIC - Abnormal; Notable for the following components:   Hgb urine dipstick TRACE (*)    Leukocytes,Ua SMALL (*)    Bacteria, UA RARE (*)    All other components within normal limits  CK - Abnormal; Notable for the following components:   Total CK 482 (*)    All other components within normal limits  CBC WITH DIFFERENTIAL/PLATELET  TROPONIN I (HIGH SENSITIVITY)   ____________________________________________  EKG   ____________________________________________  RADIOLOGY Jill Poling, personally viewed and evaluated these images (plain radiographs) as part of my medical decision making, as well as reviewing the written report by the radiologist.  ED MD interpretation: CT of the head and neck read by radiology reviewed by me show no acute disease  Official radiology report(s): CT HEAD WO CONTRAST ( )  Result Date: 03/31/2021 CLINICAL DATA:  Trauma. EXAM: CT HEAD WITHOUT CONTRAST CT CERVICAL SPINE WITHOUT CONTRAST TECHNIQUE: Multidetector CT imaging of the head and cervical spine was performed following the standard protocol without intravenous contrast. Multiplanar CT image reconstructions of the cervical spine were also generated. COMPARISON:  Head CT dated 06/07/2017. FINDINGS: CT HEAD FINDINGS Brain: Mild age-related atrophy and chronic microvascular ischemic changes. There is no acute intracranial hemorrhage. No mass effect or midline shift. No extra-axial fluid collection. Vascular: No hyperdense vessel or unexpected calcification. Skull: Normal. Negative for fracture or focal lesion. Sinuses/Orbits: Mild mucoperiosteal thickening of paranasal sinuses. Bilateral mastoid effusions, right greater left. Other: None CT CERVICAL SPINE FINDINGS Alignment: No acute subluxation. Skull  base and vertebrae: No acute fracture.  Osteopenia. Soft tissues and spinal canal: No prevertebral fluid or swelling. No visible canal hematoma. Disc levels:  Multilevel degenerative changes. Upper chest: Negative. Other: None IMPRESSION: 1. No acute intracranial pathology. Mild age-related atrophy and chronic microvascular ischemic changes. 2. No acute/traumatic cervical spine pathology. Multilevel degenerative changes. Electronically Signed   By: Burtis Junes  Radparvar M.D.   On: 03/31/2021 19:48   CT Cervical Spine Wo Contrast  Result Date: 03/31/2021 CLINICAL DATA:  Trauma. EXAM: CT HEAD WITHOUT CONTRAST CT CERVICAL SPINE WITHOUT CONTRAST TECHNIQUE: Multidetector CT imaging of the head and cervical spine was performed following the standard protocol without intravenous contrast. Multiplanar CT image reconstructions of the cervical spine were also generated. COMPARISON:  Head CT dated 06/07/2017. FINDINGS: CT HEAD FINDINGS Brain: Mild age-related atrophy and chronic microvascular ischemic changes. There is no acute intracranial hemorrhage. No mass effect or midline shift. No extra-axial fluid collection. Vascular: No hyperdense vessel or unexpected calcification. Skull: Normal. Negative for fracture or focal lesion. Sinuses/Orbits: Mild mucoperiosteal thickening of paranasal sinuses. Bilateral mastoid effusions, right greater left. Other: None CT CERVICAL SPINE FINDINGS Alignment: No acute subluxation. Skull base and vertebrae: No acute fracture.  Osteopenia. Soft tissues and spinal canal: No prevertebral fluid or swelling. No visible canal hematoma. Disc levels:  Multilevel degenerative changes. Upper chest: Negative. Other: None IMPRESSION: 1. No acute intracranial pathology. Mild age-related atrophy and chronic microvascular ischemic changes. 2. No acute/traumatic cervical spine pathology. Multilevel degenerative changes. Electronically Signed   By: Elgie Collard M.D.   On: 03/31/2021 19:48     ____________________________________________   PROCEDURES  Procedure(s) performed (including Critical Care):  Procedures   ____________________________________________   INITIAL IMPRESSION / ASSESSMENT AND PLAN / ED COURSE  ----------------------------------------- 7:58 PM on 03/31/2021 ----------------------------------------- Patient complains of no pain she looks well.  CTs are negative blood work is essentially negative her CK is 482 which is about twice normal we should be okay letting her go home.  I will let her go.              ____________________________________________   FINAL CLINICAL IMPRESSION(S) / ED DIAGNOSES  Final diagnoses:  Fall, initial encounter     ED Discharge Orders     None        Note:  This document was prepared using Dragon voice recognition software and may include unintentional dictation errors.    Arnaldo Natal, MD 03/31/21 [phone]

## 2021-03-31 NOTE — ED Notes (Signed)
Pts family (Husband & Daughter) notified of the patients discharge status and her return to [facility].

## 2021-04-13 ENCOUNTER — Emergency Department
Admission: EM | Admit: 2021-04-13 | Discharge: 2021-04-13 | Disposition: A | Discharge status: Skilled Nursing Facility | Payer: Medicare Other | Attending: Emergency Medicine | Admitting: Emergency Medicine

## 2021-04-13 ENCOUNTER — Other Ambulatory Visit: Payer: Self-pay

## 2021-04-13 ENCOUNTER — Emergency Department: Payer: Medicare Other

## 2021-04-13 DIAGNOSIS — N183 Chronic kidney disease, stage 3 unspecified: Secondary | ICD-10-CM | POA: Diagnosis not present

## 2021-04-13 DIAGNOSIS — E039 Hypothyroidism, unspecified: Secondary | ICD-10-CM | POA: Insufficient documentation

## 2021-04-13 DIAGNOSIS — S0990XA Unspecified injury of head, initial encounter: Secondary | ICD-10-CM | POA: Diagnosis present

## 2021-04-13 DIAGNOSIS — Z79899 Other long term (current) drug therapy: Secondary | ICD-10-CM | POA: Insufficient documentation

## 2021-04-13 DIAGNOSIS — Z7901 Long term (current) use of anticoagulants: Secondary | ICD-10-CM | POA: Insufficient documentation

## 2021-04-13 DIAGNOSIS — E1122 Type 2 diabetes mellitus with diabetic chronic kidney disease: Secondary | ICD-10-CM | POA: Insufficient documentation

## 2021-04-13 DIAGNOSIS — I1310 Hypertensive heart and chronic kidney disease without heart failure, with stage 1 through stage 4 chronic kidney disease, or unspecified chronic kidney disease: Secondary | ICD-10-CM | POA: Diagnosis not present

## 2021-04-13 DIAGNOSIS — I4891 Unspecified atrial fibrillation: Secondary | ICD-10-CM | POA: Diagnosis not present

## 2021-04-13 DIAGNOSIS — I2510 Atherosclerotic heart disease of native coronary artery without angina pectoris: Secondary | ICD-10-CM | POA: Insufficient documentation

## 2021-04-13 DIAGNOSIS — W19XXXA Unspecified fall, initial encounter: Secondary | ICD-10-CM

## 2021-04-13 DIAGNOSIS — S0003XA Contusion of scalp, initial encounter: Secondary | ICD-10-CM | POA: Insufficient documentation

## 2021-04-13 DIAGNOSIS — W010XXA Fall on same level from slipping, tripping and stumbling without subsequent striking against object, initial encounter: Secondary | ICD-10-CM | POA: Diagnosis not present

## 2021-04-13 DIAGNOSIS — F0390 Unspecified dementia without behavioral disturbance: Secondary | ICD-10-CM | POA: Insufficient documentation

## 2021-04-13 LAB — COMPREHENSIVE METABOLIC PANEL
ALT: 17 U/L (ref 0–44)
AST: 26 U/L (ref 15–41)
Albumin: 3.6 g/dL (ref 3.5–5.0)
Alkaline Phosphatase: 74 U/L (ref 38–126)
Anion gap: 8 (ref 5–15)
BUN: 18 mg/dL (ref 8–23)
CO2: 30 mmol/L (ref 22–32)
Calcium: 8.8 mg/dL — ABNORMAL LOW (ref 8.9–10.3)
Chloride: 100 mmol/L (ref 98–111)
Creatinine, Ser: 0.96 mg/dL (ref 0.44–1.00)
GFR, Estimated: 56 mL/min — ABNORMAL LOW (ref >60)
Glucose, Bld: 124 mg/dL — ABNORMAL HIGH (ref 70–99)
Potassium: 3.9 mmol/L (ref 3.5–5.1)
Sodium: 138 mmol/L (ref 135–145)
Total Bilirubin: 0.8 mg/dL (ref 0.3–1.2)
Total Protein: 5.9 g/dL — ABNORMAL LOW (ref 6.5–8.1)

## 2021-04-13 LAB — CBC
HCT: 36.5 % (ref 36.0–46.0)
Hemoglobin: 12.2 g/dL (ref 12.0–15.0)
MCH: 30.6 pg (ref 26.0–34.0)
MCHC: 33.4 g/dL (ref 30.0–36.0)
MCV: 91.5 fL (ref 80.0–100.0)
Platelets: 182 10*3/uL (ref 150–400)
RBC: 3.99 MIL/uL (ref 3.87–5.11)
RDW: 13.2 % (ref 11.5–15.5)
WBC: 7.7 10*3/uL (ref 4.0–10.5)
nRBC: 0 % (ref 0.0–0.2)

## 2021-04-13 LAB — URINALYSIS, COMPLETE (UACMP) WITH MICROSCOPIC
Bacteria, UA: NONE SEEN
Bilirubin Urine: NEGATIVE
Glucose, UA: NEGATIVE mg/dL
Hgb urine dipstick: NEGATIVE
Ketones, ur: NEGATIVE mg/dL
Leukocytes,Ua: NEGATIVE
Nitrite: NEGATIVE
Protein, ur: NEGATIVE mg/dL
Specific Gravity, Urine: 1.013 (ref 1.005–1.030)
Squamous Epithelial / HPF: NONE SEEN (ref 0–5)
pH: 8 (ref 5.0–8.0)

## 2021-04-13 NOTE — ED Provider Notes (Signed)
[facility] Emergency Department Provider Note   ____________________________________________   Event Date/Time   First MD Initiated Contact with Patient 04/13/21 1501     (approximate)  I have reviewed the triage vital signs and the nursing notes.   HISTORY  Chief Complaint Fall    HPI Tiffany Grimes is a 87 y.o. female with past medical history of hypertension, atrial fibrillation on Xarelto, TIA, and dementia who presents to the ED for fall.  History is limited due to patient's dementia.  Family at bedside reports that she had just gotten up from lunch when she tripped and fell forward, striking her head.  Family is not sure whether she Lost consciousness, but reports that she was awake answering questions shortly after the fall and so would not of lost consciousness for very long.  They states she has seemed sleepy after the fall and staff at her nursing facility were concerned about the left side of her face drooping.  Patient currently denies any complaints, denies headache, neck pain, chest pain, abdominal pain, or extremity pain.  She currently takes Xarelto for her atrial fibrillation.        Past Medical History:  Diagnosis Date   Atrial fibrillation [facility])    Atrial fibrillation (HCC)    Barrett esophagus    due for EGD July 2012, [place]   Dysrhythmia    Hx of thyroid nodule July 2012   benign biopsy    Hypertension    Hypothyroidism    Lentigo    Reflux    Thyroid disease    TIA (transient ischemic attack) June 2012   TIA (transient ischemic attack) June 2012   Wears dentures    partial upper   Wears hearing aid     Patient Active Problem List   Diagnosis Date Noted   Coronary artery disease 07/21/2018   History of TIA (transient ischemic attack) 07/21/2018   Pulmonary hypertension (HCC) 07/21/2018   Type 2 diabetes mellitus with stage 3 chronic kidney disease, without long-term current use of insulin (HCC) 05/06/2018   Mild  protein-calorie malnutrition (HCC) 02/10/2018   Carotid artery stenosis 02/28/2017   Alpha-1-antitrypsin deficiency (HCC) 10/19/2015   History of depression 10/19/2015   Underweight 07/30/2015   Furrowed tongue 07/30/2015   Benign essential HTN 05/11/2014   Severe mitral insufficiency 05/11/2014   Centrilobular emphysema (HCC) 04/18/2014   Alpha-1-antitrypsin deficiency carrier 04/18/2014   Unspecified hypothyroidism 04/07/2014   Multiple pulmonary nodules 10/05/2013   Pulmonary nodules/lesions, multiple 08/31/2013   PAD (peripheral artery disease) (HCC) 08/08/2013   External hemorrhoid 08/02/2013   Anal fissure 08/02/2013   Ecchymosis 05/10/2013   Anxiety state 10/28/2012   Routine general medical examination at a health care facility 06/15/2012   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   Osteoporosis, post-menopausal 02/11/2012   Lumbago with sciatica of right side 02/11/2012   Posterior neck pain 02/11/2012   Hyperlipidemia LDL goal <100 12/16/2011   Hx of thyroid nodule    Atrial fibrillation, new onset (HCC) 08/02/2011   Chest pain, atypical 08/02/2011   TIA (transient ischemic attack)    Barrett esophagus    Hypertension 03/29/2011    Past Surgical History:  Procedure Laterality Date   ABDOMINAL HYSTERECTOMY     CATARACT EXTRACTION W/PHACO Right 07/29/2016   Procedure: CATARACT EXTRACTION PHACO AND INTRAOCULAR LENS PLACEMENT (IOC);  Surgeon: Sherald Hess, MD;  Location: [facility];  Service: Ophthalmology;  Laterality: Right;  right   CATARACT EXTRACTION W/PHACO  Left 07/02/2017   Procedure: CATARACT EXTRACTION PHACO AND INTRAOCULAR LENS PLACEMENT (IOC);  Surgeon: Nevada Crane, MD;  Location: [facility];  Service: Ophthalmology;  Laterality: Left;  [place] 00:50.9 AP% 14.8 CDE 7.54 Fluid Pack Lot # [id number]   CESAREAN SECTION     HERNIA REPAIR  2008   left inguinal   THYROID SURGERY  june 18   nodule removed    Prior to Admission  medications   Medication Sig Start Date End Date Taking? Authorizing Provider  amLODipine (NORVASC) 5 MG tablet TAKE 1 TABLET BY MOUTH DAILY. 06/11/16  Yes Sherlene Shams, MD  atorvastatin (LIPITOR) 20 MG tablet TAKE 1 TABLET DAILY 07/14/15  Yes Sherlene Shams, MD  cloNIDine (CATAPRES) 0.1 MG tablet Take 0.1 mg by mouth 2 (two) times daily.  12/17/17  Yes [provider]  esomeprazole (NEXIUM) 40 MG capsule TAKE 1 CAPSULE DAILY BEFORE BREAKFAST 04/01/16  Yes Sherlene Shams, MD  furosemide (LASIX) 20 MG tablet Take 20 mg by mouth daily as needed for edema. 09/02/18 04/13/21 Yes [provider]  hydrOXYzine (ATARAX/VISTARIL) 25 MG tablet Take 25 mg by mouth 3 (three) times daily as needed.   Yes [provider]  latanoprost (XALATAN) 0.005 % ophthalmic solution Place 1 drop into both eyes at bedtime.    Yes [provider]  levothyroxine (SYNTHROID) 100 MCG tablet Take 100 mcg by mouth daily before breakfast.   Yes [provider]  lisinopril (PRINIVIL,ZESTRIL) 20 MG tablet Take 20 mg by mouth 2 (two) times daily.   Yes [provider]  LORazepam (ATIVAN) 0.5 MG tablet Take 0.5 mg by mouth 3 (three) times daily as needed. 03/30/21  Yes [provider]  QUEtiapine (SEROQUEL) 25 MG tablet Take 25 mg by mouth 2 (two) times daily.   Yes [provider]  XARELTO 15 MG TABS tablet TAKE 1 TABLET DAILY 11/06/15  Yes Sherlene Shams, MD  atenolol (TENORMIN) 25 MG tablet TAKE 1 TABLET TWICE A DAY Patient not taking: No sig reported 12/21/15   Sherlene Shams, MD  azelastine (ASTELIN) 0.1 % nasal spray Place 1 spray into both nostrils 2 (two) times daily. Use in each nostril as directed Patient not taking: No sig reported    [provider]  polyethylene glycol (MIRALAX / GLYCOLAX) 17 g packet Take 17 g by mouth daily. Patient not taking: No sig reported 10/04/19   Emily Filbert, MD  sucralfate (CARAFATE) 1 g tablet Take 1 tablet  (1 g total) by mouth 2 (two) times daily. Patient not taking: No sig reported 08/28/15   Sherlene Shams, MD    Allergies Neomycin  Family History  Problem Relation Age of Onset   Hypertension Mother    Stroke Mother    Heart disease Father 15       AMI   Hypertension Father    Vision loss Paternal Aunt    Cancer Neg Hx     Social History Social History   Tobacco Use   Smoking status: Never   Smokeless tobacco: Never  Vaping Use   Vaping Use: Never used  Substance Use Topics   Alcohol use: No   Drug use: No    Review of Systems  Constitutional: No fever/chills.  Positive for fall. Eyes: No visual changes. ENT: No sore throat. Cardiovascular: Denies chest pain. Respiratory: Denies shortness of breath. Gastrointestinal: No abdominal pain.  No nausea, no vomiting.  No diarrhea.  No constipation. Genitourinary: Negative  for dysuria. Musculoskeletal: Negative for back pain. Skin: Negative for rash. Neurological: Negative for headaches, focal weakness or numbness.  ____________________________________________   PHYSICAL EXAM:  VITAL SIGNS: ED Triage Vitals  Enc Vitals Group     BP 04/13/21 1434 (!) 144/82     Pulse Rate 04/13/21 1434 71     Resp 04/13/21 1434 14     Temp 04/13/21 1434 97.7 F (36.5 C)     Temp Source 04/13/21 1434 Axillary     SpO2 04/13/21 1434 99 %     Weight 04/13/21 1431 120 lb (54.4 kg)     Height 04/13/21 1431 5\' 6"  (1.676 m)     Head Circumference --      Peak Flow --      Pain Score --      Pain Loc --      Pain Edu? --      Excl. in GC? --     Constitutional: Awake and alert. Eyes: Conjunctivae are normal. Head: Frontal scalp hematoma noted with no laceration or step-offs. Nose: No congestion/rhinnorhea. Mouth/Throat: Mucous membranes are moist. Neck: Normal ROM, no midline cervical spine tenderness to palpation. Cardiovascular: Normal rate, regular rhythm. Grossly normal heart sounds. Respiratory: Normal respiratory  effort.  No retractions. Lungs CTAB.  No chest wall bony tenderness to palpation. Gastrointestinal: Soft and nontender. No distention. Genitourinary: deferred Musculoskeletal: No lower extremity tenderness nor edema.  No upper extremity bony tenderness to palpation. Neurologic:  Normal speech and language. No gross focal neurologic deficits are appreciated. Skin:  Skin is warm, dry and intact. No rash noted. Psychiatric: Mood and affect are normal. Speech and behavior are normal.  ____________________________________________   LABS (all labs ordered are listed, but only abnormal results are displayed)  Labs Reviewed  COMPREHENSIVE METABOLIC PANEL - Abnormal; Notable for the following components:      Result Value   Glucose, Bld 124 (*)    Calcium 8.8 (*)    Total Protein 5.9 (*)    GFR, Estimated 56 (*)    All other components within normal limits  URINALYSIS, COMPLETE (UACMP) WITH MICROSCOPIC - Abnormal; Notable for the following components:   Color, Urine YELLOW (*)    APPearance CLEAR (*)    All other components within normal limits  CBC  CBG MONITORING, ED   ____________________________________________  EKG  ED ECG REPORT I, , the attending physician, personally viewed and interpreted this ECG.   Date: 04/13/2021  EKG Time: 14:45  Rate: 68  Rhythm: atrial fibrillation  Axis: Normal  Intervals:none  ST&T Change: None    PROCEDURES  Procedure(s) performed (including Critical Care):  Procedures   ____________________________________________   INITIAL IMPRESSION / ASSESSMENT AND PLAN / ED COURSE      87 year old female with past medical history of hypertension, atrial fibrillation on Xarelto, TIA, and dementia who presents to the ED following trip and fall at her nursing facility earlier today.  She struck her head and is unclear whether she lost consciousness.  She does appear at her baseline mental status with no focal neurologic deficits.   CT head and cervical spine are negative for acute process, labs also performed and are unremarkable, UA shows no signs of infection.  Patient is appropriate for discharge back to nursing facility, daughter at bedside agrees with plan.      ____________________________________________   FINAL CLINICAL IMPRESSION(S) / ED DIAGNOSES  Final diagnoses:  Fall, initial encounter  Hematoma of scalp, initial encounter  Dementia without behavioral  disturbance, unspecified dementia type [facility])     ED Discharge Orders     None        Note:  This document was prepared using Dragon voice recognition software and may include unintentional dictation errors.    Chesley Noon, MD 04/13/21 Windell Moment

## 2021-04-13 NOTE — ED Triage Notes (Signed)
Pt comes into the ED via [facility] from [facility] c/o unwitnessed fall.  Pt has a hematoma present to her head at this time.  Pt denies any blood thinners.    130/68 68 HR 99% RA CBG 208

## 2021-04-13 NOTE — ED Triage Notes (Signed)
See triage note Pt is on xeralto, was given ativan per [facility] sent with pt chart. Pt has slurred speech, hematoma to the forehead. Husband is at the bedside. States this is the first time he has seen her since she went there 10 days ago. States normally the pt is ambulatory with clear speech but has dementia with a baseline of confusion

## 2021-04-13 NOTE — ED Notes (Signed)
Daughter takes home glasses & shoes. Transport arranged for patient. Repositioned to position of comfort.   Daughter to leave at this time & await patient at [facility]. Aware of no transport ETA at this time.    Pt arousable to voice, requesting next steps. Educated to transport request. Pt agreeable to this plan.

## 2021-04-13 NOTE — ED Notes (Signed)
Request made for [facility] to transport to [facility]

## 2021-04-19 ENCOUNTER — Emergency Department
Admission: EM | Admit: 2021-04-19 | Discharge: 2021-04-20 | Disposition: A | Discharge status: Skilled Nursing Facility | Payer: Medicare Other | Source: Home / Self Care | Attending: Emergency Medicine | Admitting: Emergency Medicine

## 2021-04-19 ENCOUNTER — Emergency Department: Payer: Medicare Other

## 2021-04-19 ENCOUNTER — Emergency Department
Admission: EM | Admit: 2021-04-19 | Discharge: 2021-04-19 | Disposition: A | Discharge status: Home / Self Care | Payer: Medicare Other | Source: Home / Self Care | Attending: Emergency Medicine | Admitting: Emergency Medicine

## 2021-04-19 ENCOUNTER — Encounter: Payer: Self-pay | Admitting: *Deleted

## 2021-04-19 ENCOUNTER — Encounter: Payer: Self-pay | Admitting: Emergency Medicine

## 2021-04-19 ENCOUNTER — Other Ambulatory Visit: Payer: Self-pay

## 2021-04-19 DIAGNOSIS — W010XXA Fall on same level from slipping, tripping and stumbling without subsequent striking against object, initial encounter: Secondary | ICD-10-CM | POA: Insufficient documentation

## 2021-04-19 DIAGNOSIS — I1310 Hypertensive heart and chronic kidney disease without heart failure, with stage 1 through stage 4 chronic kidney disease, or unspecified chronic kidney disease: Secondary | ICD-10-CM | POA: Insufficient documentation

## 2021-04-19 DIAGNOSIS — A419 Sepsis, unspecified organism: Secondary | ICD-10-CM | POA: Diagnosis not present

## 2021-04-19 DIAGNOSIS — E039 Hypothyroidism, unspecified: Secondary | ICD-10-CM | POA: Insufficient documentation

## 2021-04-19 DIAGNOSIS — S0990XA Unspecified injury of head, initial encounter: Secondary | ICD-10-CM | POA: Insufficient documentation

## 2021-04-19 DIAGNOSIS — Z79899 Other long term (current) drug therapy: Secondary | ICD-10-CM | POA: Insufficient documentation

## 2021-04-19 DIAGNOSIS — F0390 Unspecified dementia without behavioral disturbance: Secondary | ICD-10-CM | POA: Insufficient documentation

## 2021-04-19 DIAGNOSIS — N183 Chronic kidney disease, stage 3 unspecified: Secondary | ICD-10-CM | POA: Insufficient documentation

## 2021-04-19 DIAGNOSIS — Z7901 Long term (current) use of anticoagulants: Secondary | ICD-10-CM | POA: Insufficient documentation

## 2021-04-19 DIAGNOSIS — I129 Hypertensive chronic kidney disease with stage 1 through stage 4 chronic kidney disease, or unspecified chronic kidney disease: Secondary | ICD-10-CM | POA: Insufficient documentation

## 2021-04-19 DIAGNOSIS — Y92129 Unspecified place in nursing home as the place of occurrence of the external cause: Secondary | ICD-10-CM | POA: Insufficient documentation

## 2021-04-19 DIAGNOSIS — W01198A Fall on same level from slipping, tripping and stumbling with subsequent striking against other object, initial encounter: Secondary | ICD-10-CM | POA: Insufficient documentation

## 2021-04-19 DIAGNOSIS — R52 Pain, unspecified: Secondary | ICD-10-CM | POA: Diagnosis not present

## 2021-04-19 DIAGNOSIS — E1122 Type 2 diabetes mellitus with diabetic chronic kidney disease: Secondary | ICD-10-CM | POA: Insufficient documentation

## 2021-04-19 DIAGNOSIS — M25552 Pain in left hip: Secondary | ICD-10-CM | POA: Insufficient documentation

## 2021-04-19 DIAGNOSIS — W19XXXA Unspecified fall, initial encounter: Secondary | ICD-10-CM

## 2021-04-19 DIAGNOSIS — I4891 Unspecified atrial fibrillation: Secondary | ICD-10-CM | POA: Insufficient documentation

## 2021-04-19 DIAGNOSIS — I2510 Atherosclerotic heart disease of native coronary artery without angina pectoris: Secondary | ICD-10-CM | POA: Insufficient documentation

## 2021-04-19 LAB — BASIC METABOLIC PANEL
Anion gap: 10 (ref 5–15)
BUN: 26 mg/dL — ABNORMAL HIGH (ref 8–23)
CO2: 29 mmol/L (ref 22–32)
Calcium: 8.9 mg/dL (ref 8.9–10.3)
Chloride: 98 mmol/L (ref 98–111)
Creatinine, Ser: 0.81 mg/dL (ref 0.44–1.00)
GFR, Estimated: >60 mL/min (ref >60)
Glucose, Bld: 147 mg/dL — ABNORMAL HIGH (ref 70–99)
Potassium: 4.5 mmol/L (ref 3.5–5.1)
Sodium: 137 mmol/L (ref 135–145)

## 2021-04-19 LAB — CBC
HCT: 38.5 % (ref 36.0–46.0)
Hemoglobin: 12.9 g/dL (ref 12.0–15.0)
MCH: 30.1 pg (ref 26.0–34.0)
MCHC: 33.5 g/dL (ref 30.0–36.0)
MCV: 90 fL (ref 80.0–100.0)
Platelets: 253 10*3/uL (ref 150–400)
RBC: 4.28 MIL/uL (ref 3.87–5.11)
RDW: 13.2 % (ref 11.5–15.5)
WBC: 7.6 10*3/uL (ref 4.0–10.5)
nRBC: 0 % (ref 0.0–0.2)

## 2021-04-19 LAB — TROPONIN I (HIGH SENSITIVITY)
Troponin I (High Sensitivity): 10 ng/L (ref <18)
Troponin I (High Sensitivity): 10 ng/L (ref <18)

## 2021-04-19 NOTE — Discharge Instructions (Addendum)
Return to the ER for worsening symptoms, persistent vomiting, difficulty breathing or other concerns. °

## 2021-04-19 NOTE — ED Provider Notes (Signed)
[facility] Emergency Department Provider Note   ____________________________________________   Event Date/Time   First MD Initiated Contact with Patient 04/19/21 0153     (approximate)  I have reviewed the triage vital signs and the nursing notes.   HISTORY  Chief Complaint Fall, left hip pain  Level of V caveat: Limited by dementia  HPI PRISTINE GLADHILL is a 87 y.o. female brought to the ED from [facility] status post mechanical fall.  Patient reports she lost her balance and fell onto her left hip.  Denies striking head or LOC.  Per [facility], looks like patient is on Xarelto for atrial fibrillation.  Denies headache, vision changes, neck pain, chest pain, shortness of breath, abdominal pain, nausea or vomiting.     Past Medical History:  Diagnosis Date  . Atrial fibrillation (HCC)   . Atrial fibrillation (HCC)   . Barrett esophagus    due for EGD July 2012, [place]  . Dysrhythmia   . Hx of thyroid nodule July 2012   benign biopsy   . Hypertension   . Hypothyroidism   . Lentigo   . Reflux   . Thyroid disease   . TIA (transient ischemic attack) June 2012  . TIA (transient ischemic attack) June 2012  . Wears dentures    partial upper  . Wears hearing aid     Patient Active Problem List   Diagnosis Date Noted  . Coronary artery disease 07/21/2018  . History of TIA (transient ischemic attack) 07/21/2018  . Pulmonary hypertension (HCC) 07/21/2018  . Type 2 diabetes mellitus with stage 3 chronic kidney disease, without long-term current use of insulin (HCC) 05/06/2018  . Mild protein-calorie malnutrition (HCC) 02/10/2018  . Carotid artery stenosis 02/28/2017  . Alpha-1-antitrypsin deficiency (HCC) 10/19/2015  . History of depression 10/19/2015  . Underweight 07/30/2015  . Furrowed tongue 07/30/2015  . Benign essential HTN 05/11/2014  . Severe mitral insufficiency 05/11/2014  . Centrilobular emphysema (HCC) 04/18/2014  . Alpha-1-antitrypsin  deficiency carrier 04/18/2014  . Unspecified hypothyroidism 04/07/2014  . Multiple pulmonary nodules 10/05/2013  . Pulmonary nodules/lesions, multiple 08/31/2013  . PAD (peripheral artery disease) (HCC) 08/08/2013  . External hemorrhoid 08/02/2013  . Anal fissure 08/02/2013  . Ecchymosis 05/10/2013  . Anxiety state 10/28/2012  . Routine general medical examination at a health care facility 06/15/2012  . 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 Osteoporosis, post-menopausal 02/11/2012  . Lumbago with sciatica of right side 02/11/2012  . Posterior neck pain 02/11/2012  . Hyperlipidemia LDL goal <100 12/16/2011  . Hx of thyroid nodule   . Atrial fibrillation, new onset (HCC) 08/02/2011  . Chest pain, atypical 08/02/2011  . TIA (transient ischemic attack)   . Barrett esophagus   . Hypertension 03/29/2011    Past Surgical History:  Procedure Laterality Date  . ABDOMINAL HYSTERECTOMY    . CATARACT EXTRACTION W/PHACO Right 07/29/2016   Procedure: CATARACT EXTRACTION PHACO AND INTRAOCULAR LENS PLACEMENT (IOC);  Surgeon: Sherald Hess, MD;  Location: [facility];  Service: Ophthalmology;  Laterality: Right;  right  . CATARACT EXTRACTION W/PHACO Left 07/02/2017   Procedure: CATARACT EXTRACTION PHACO AND INTRAOCULAR LENS PLACEMENT (IOC);  Surgeon: Nevada Crane, MD;  Location: [facility];  Service: Ophthalmology;  Laterality: Left;  [place] 00:50.9 AP% 14.8 CDE 7.54 Fluid Pack Lot # [id number]  . CESAREAN SECTION    . HERNIA REPAIR  2008   left inguinal  . THYROID SURGERY  june 18   nodule removed  Prior to Admission medications   Medication Sig Start Date End Date Taking? Authorizing Provider  amLODipine (NORVASC) 5 MG tablet TAKE 1 TABLET BY MOUTH DAILY. 06/11/16   Sherlene Shams, MD  atenolol (TENORMIN) 25 MG tablet TAKE 1 TABLET TWICE A DAY Patient not taking: No sig reported 12/21/15   Sherlene Shams, MD  atorvastatin (LIPITOR) 20 MG tablet TAKE 1  TABLET DAILY 07/14/15   Sherlene Shams, MD  azelastine (ASTELIN) 0.1 % nasal spray Place 1 spray into both nostrils 2 (two) times daily. Use in each nostril as directed Patient not taking: No sig reported    [provider]  cloNIDine (CATAPRES) 0.1 MG tablet Take 0.1 mg by mouth 2 (two) times daily.  12/17/17   [provider]  esomeprazole (NEXIUM) 40 MG capsule TAKE 1 CAPSULE DAILY BEFORE BREAKFAST 04/01/16   Sherlene Shams, MD  furosemide (LASIX) 20 MG tablet Take 20 mg by mouth daily as needed for edema. 09/02/18 04/13/21  [provider]  hydrOXYzine (ATARAX/VISTARIL) 25 MG tablet Take 25 mg by mouth 3 (three) times daily as needed.    [provider]  latanoprost (XALATAN) 0.005 % ophthalmic solution Place 1 drop into both eyes at bedtime.     [provider]  levothyroxine (SYNTHROID) 100 MCG tablet Take 100 mcg by mouth daily before breakfast.    [provider]  lisinopril (PRINIVIL,ZESTRIL) 20 MG tablet Take 20 mg by mouth 2 (two) times daily.    [provider]  LORazepam (ATIVAN) 0.5 MG tablet Take 0.5 mg by mouth 3 (three) times daily as needed. 03/30/21   [provider]  polyethylene glycol (MIRALAX / GLYCOLAX) 17 g packet Take 17 g by mouth daily. Patient not taking: No sig reported 10/04/19   Emily Filbert, MD  QUEtiapine (SEROQUEL) 25 MG tablet Take 25 mg by mouth 2 (two) times daily.    [provider]  sucralfate (CARAFATE) 1 g tablet Take 1 tablet (1 g total) by mouth 2 (two) times daily. Patient not taking: No sig reported 08/28/15   Sherlene Shams, MD  XARELTO 15 MG TABS tablet TAKE 1 TABLET DAILY 11/06/15   Sherlene Shams, MD    Allergies Neomycin  Family History  Problem Relation Age of Onset  . Hypertension Mother   . Stroke Mother   . Heart disease Father 1       AMI  . Hypertension Father   . Vision loss Paternal Aunt   . Cancer Neg Hx     Social History Social History    Tobacco Use  . Smoking status: Never  . Smokeless tobacco: Never  Vaping Use  . Vaping Use: Never used  Substance Use Topics  . Alcohol use: No  . Drug use: No    Review of Systems  Constitutional: No fever/chills Eyes: No visual changes. ENT: No sore throat. Cardiovascular: Denies chest pain. Respiratory: Denies shortness of breath. Gastrointestinal: No abdominal pain.  No nausea, no vomiting.  No diarrhea.  No constipation. Genitourinary: Negative for dysuria. Musculoskeletal: Positive for left hip pain.  Negative for back pain. Skin: Negative for rash. Neurological: Negative for headaches, focal weakness or numbness.   ____________________________________________   PHYSICAL EXAM:  VITAL SIGNS: ED Triage Vitals  Enc Vitals Group     BP 04/19/21 0105 (!) 155/72     Pulse Rate 04/19/21 0105 85     Resp 04/19/21 0105 15     Temp 04/19/21 0105  98 F (36.7 C)     Temp Source 04/19/21 0105 Oral     SpO2 04/19/21 0105 95 %     Weight 04/19/21 0105 119 lb 14.9 oz (54.4 kg)     Height 04/19/21 0105 5\' 6"  (1.676 m)     Head Circumference --      Peak Flow --      Pain Score 04/19/21 0105 10     Pain Loc --      Pain Edu? --      Excl. in GC? --     Constitutional: Alert and oriented.  Elderly appearing and in no acute distress. Eyes: Conjunctivae are normal. PERRL. EOMI. Head: Atraumatic.  Old forehead ecchymosis noted. Nose: Atraumatic. Mouth/Throat: Mucous membranes are mildly dry.  Edentulous. Neck: No stridor.  No cervical spine tenderness to palpation. Cardiovascular: Normal rate, regular rhythm. Grossly normal heart sounds.  Good peripheral circulation. Respiratory: Normal respiratory effort.  No retractions. Lungs CTAB. Gastrointestinal: Soft and nontender to light or deep palpation. No distention. No abdominal bruits. No CVA tenderness. Musculoskeletal: No spinal tenderness to palpation.  Pelvis is stable.  Left hip freely movable without pain.  No lower  extremity tenderness nor edema.  No joint effusions. Neurologic: Alert and oriented to name.  Normal speech and language. No gross focal neurologic deficits are appreciated.  Skin:  Skin is warm, dry and intact. No rash noted. Psychiatric: Mood and affect are normal. Speech and behavior are normal.  ____________________________________________   LABS (all labs ordered are listed, but only abnormal results are displayed)  Labs Reviewed - No data to display ____________________________________________  EKG  None ____________________________________________  RADIOLOGY I, Jaielle Dlouhy J, personally viewed and evaluated these images (plain radiographs) as part of my medical decision making, as well as reviewing the written report by the radiologist.  ED MD interpretation: Negative left hip x-ray  Official radiology report(s): DG Hip Unilat W or Wo Pelvis 2-3 Views Left  Result Date: 04/19/2021 CLINICAL DATA:  Fall EXAM: DG HIP (WITH OR WITHOUT PELVIS) 2-3V LEFT COMPARISON:  None. FINDINGS: There is no evidence of hip fracture or dislocation. There is no evidence of arthropathy or other focal bone abnormality. IMPRESSION: Negative. Electronically Signed   By: 04/21/2021 M.D.   On: 04/19/2021 02:03    ____________________________________________   PROCEDURES  Procedure(s) performed (including Critical Care):  Procedures   ____________________________________________   INITIAL IMPRESSION / ASSESSMENT AND PLAN / ED COURSE  As part of my medical decision making, I reviewed the following data within the electronic MEDICAL RECORD NUMBER Nursing notes reviewed and incorporated, Old chart reviewed, Radiograph reviewed, and Notes from prior ED visits     87 year old female presenting with left hip pain status post fall.  Differential diagnosis includes but is not limited to hip fracture, pelvic fracture, contusion, etc.  X-rays negative.  Will perform ambulation trial.  Clinical Course  as of 04/19/21 0246  Thu Apr 19, 2021  0246 Patient ambulated with walker without difficulty.  Will discharge back to facility.  Strict return precautions given.  Patient verbalizes understanding and agrees with plan of care. [JS]    Clinical Course User Index [JS] Apr 21, 2021, MD     ____________________________________________   FINAL CLINICAL IMPRESSION(S) / ED DIAGNOSES  Final diagnoses:  Fall, initial encounter  Left hip pain     ED Discharge Orders     None        Note:  This document was prepared using Dragon voice  recognition software and may include unintentional dictation errors.    Irean Hong, MD 04/19/21 [phone]

## 2021-04-19 NOTE — ED Notes (Signed)
Pt reports fall tonight, states she tripped and whenever she falls its usually her L hip that is painful.  Pt given warm blanket.  Denies any further needs at this time.

## 2021-04-19 NOTE — ED Notes (Signed)
Called EMS for transport back to [facility] 

## 2021-04-19 NOTE — ED Notes (Signed)
Daughter updated at this time re: patient's status.

## 2021-04-19 NOTE — ED Notes (Signed)
Pt ambulated in room with walker and gait belt, pt walked to toilet and able to sit and stand up from the toilet with minimal assistance. NO c/o pain to L hip or leg. Pt able to bear weight to both legs.

## 2021-04-19 NOTE — ED Triage Notes (Addendum)
Pt brought in via ems from [facility].  Pt fell tonight striking her head on a table. Pt has a laceration to back of head.  Pt has swelling to both ankles.  Hx afib.  Pt got up from a table with her walker and fell.  No loc.  Pt alert speech clear.   Pt is on blood thinners

## 2021-04-19 NOTE — ED Triage Notes (Signed)
EMS brings pt in from [facility] for fall PTA--transferred to stretcher, no distress noted; c/o left hip pain only, denies any other c/o or injuries; no deformity or shortening noted

## 2021-04-20 LAB — URINALYSIS, MICROSCOPIC (REFLEX)
Bacteria, UA: NONE SEEN
RBC / HPF: NONE SEEN RBC/hpf (ref 0–5)

## 2021-04-20 LAB — URINALYSIS, ROUTINE W REFLEX MICROSCOPIC
Bilirubin Urine: NEGATIVE
Glucose, UA: NEGATIVE mg/dL
Leukocytes,Ua: NEGATIVE
Nitrite: NEGATIVE
Specific Gravity, Urine: >1.03 — ABNORMAL HIGH (ref 1.005–1.030)
pH: 5.5 (ref 5.0–8.0)

## 2021-04-20 NOTE — ED Provider Notes (Signed)
[facility] Emergency Department Provider Note ____________________________________________   Event Date/Time   First MD Initiated Contact with Patient 04/19/21 2338     (approximate)  I have reviewed the triage vital signs and the nursing notes.   HISTORY  Chief Complaint Fall    HPI Tiffany Grimes is a 87 y.o. female with history of dementia, hypertension, atrial fibrillation on Xarelto, hypothyroidism who presents to the emergency department from [facility] assisted living facility after she had a fall.  Patient is unable to provide any meaningful history.  She was just seen in the emergency department on 9/23 and 9/29 for a fall.  No family at bedside.  Spoke with med tech at [facility] who states that she did not get there until 11 PM and was not given report on the patient.         Past Medical History:  Diagnosis Date   Atrial fibrillation [facility])    Atrial fibrillation (HCC)    Barrett esophagus    due for EGD July 2012, [place]   Dysrhythmia    Hx of thyroid nodule July 2012   benign biopsy    Hypertension    Hypothyroidism    Lentigo    Reflux    Thyroid disease    TIA (transient ischemic attack) June 2012   TIA (transient ischemic attack) June 2012   Wears dentures    partial upper   Wears hearing aid     Patient Active Problem List   Diagnosis Date Noted   Coronary artery disease 07/21/2018   History of TIA (transient ischemic attack) 07/21/2018   Pulmonary hypertension (HCC) 07/21/2018   Type 2 diabetes mellitus with stage 3 chronic kidney disease, without long-term current use of insulin (HCC) 05/06/2018   Mild protein-calorie malnutrition (HCC) 02/10/2018   Carotid artery stenosis 02/28/2017   Alpha-1-antitrypsin deficiency (HCC) 10/19/2015   History of depression 10/19/2015   Underweight 07/30/2015   Furrowed tongue 07/30/2015   Benign essential HTN 05/11/2014   Severe mitral insufficiency 05/11/2014    Centrilobular emphysema (HCC) 04/18/2014   Alpha-1-antitrypsin deficiency carrier 04/18/2014   Unspecified hypothyroidism 04/07/2014   Multiple pulmonary nodules 10/05/2013   Pulmonary nodules/lesions, multiple 08/31/2013   PAD (peripheral artery disease) (HCC) 08/08/2013   External hemorrhoid 08/02/2013   Anal fissure 08/02/2013   Ecchymosis 05/10/2013   Anxiety state 10/28/2012   Routine general medical examination at a health care facility 06/15/2012   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   Osteoporosis, post-menopausal 02/11/2012   Lumbago with sciatica of right side 02/11/2012   Posterior neck pain 02/11/2012   Hyperlipidemia LDL goal <100 12/16/2011   Hx of thyroid nodule    Atrial fibrillation, new onset (HCC) 08/02/2011   Chest pain, atypical 08/02/2011   TIA (transient ischemic attack)    Barrett esophagus    Hypertension 03/29/2011    Past Surgical History:  Procedure Laterality Date   ABDOMINAL HYSTERECTOMY     CATARACT EXTRACTION W/PHACO Right 07/29/2016   Procedure: CATARACT EXTRACTION PHACO AND INTRAOCULAR LENS PLACEMENT (IOC);  Surgeon: Sherald Hess, MD;  Location: [facility];  Service: Ophthalmology;  Laterality: Right;  right   CATARACT EXTRACTION W/PHACO Left 07/02/2017   Procedure: CATARACT EXTRACTION PHACO AND INTRAOCULAR LENS PLACEMENT (IOC);  Surgeon: Nevada Crane, MD;  Location: [facility];  Service: Ophthalmology;  Laterality: Left;  [place] 00:50.9 AP% 14.8 CDE 7.54 Fluid Pack Lot # [id number]   CESAREAN SECTION     HERNIA  REPAIR  2008   left inguinal   THYROID SURGERY  june 18   nodule removed    Prior to Admission medications   Medication Sig Start Date End Date Taking? Authorizing Provider  amLODipine (NORVASC) 5 MG tablet TAKE 1 TABLET BY MOUTH DAILY. 06/11/16   Sherlene Shams, MD  atenolol (TENORMIN) 25 MG tablet TAKE 1 TABLET TWICE A DAY Patient not taking: No sig reported 12/21/15   Sherlene Shams, MD   atorvastatin (LIPITOR) 20 MG tablet TAKE 1 TABLET DAILY 07/14/15   Sherlene Shams, MD  azelastine (ASTELIN) 0.1 % nasal spray Place 1 spray into both nostrils 2 (two) times daily. Use in each nostril as directed Patient not taking: No sig reported    [provider]  cloNIDine (CATAPRES) 0.1 MG tablet Take 0.1 mg by mouth 2 (two) times daily.  12/17/17   [provider]  esomeprazole (NEXIUM) 40 MG capsule TAKE 1 CAPSULE DAILY BEFORE BREAKFAST 04/01/16   Sherlene Shams, MD  furosemide (LASIX) 20 MG tablet Take 20 mg by mouth daily as needed for edema. 09/02/18 04/13/21  [provider]  hydrOXYzine (ATARAX/VISTARIL) 25 MG tablet Take 25 mg by mouth 3 (three) times daily as needed.    [provider]  latanoprost (XALATAN) 0.005 % ophthalmic solution Place 1 drop into both eyes at bedtime.     [provider]  levothyroxine (SYNTHROID) 100 MCG tablet Take 100 mcg by mouth daily before breakfast.    [provider]  lisinopril (PRINIVIL,ZESTRIL) 20 MG tablet Take 20 mg by mouth 2 (two) times daily.    [provider]  LORazepam (ATIVAN) 0.5 MG tablet Take 0.5 mg by mouth 3 (three) times daily as needed. 03/30/21   [provider]  polyethylene glycol (MIRALAX / GLYCOLAX) 17 g packet Take 17 g by mouth daily. Patient not taking: No sig reported 10/04/19   Emily Filbert, MD  QUEtiapine (SEROQUEL) 25 MG tablet Take 25 mg by mouth 2 (two) times daily.    [provider]  sucralfate (CARAFATE) 1 g tablet Take 1 tablet (1 g total) by mouth 2 (two) times daily. Patient not taking: No sig reported 08/28/15   Sherlene Shams, MD  XARELTO 15 MG TABS tablet TAKE 1 TABLET DAILY 11/06/15   Sherlene Shams, MD    Allergies Neomycin  Family History  Problem Relation Age of Onset   Hypertension Mother    Stroke Mother    Heart disease Father 34       AMI   Hypertension Father    Vision loss Paternal Aunt    Cancer Neg Hx      Social History Social History   Tobacco Use   Smoking status: Never   Smokeless tobacco: Never  Vaping Use   Vaping Use: Never used  Substance Use Topics   Alcohol use: No   Drug use: No    Review of Systems Level 5 caveat secondary to dementia   ____________________________________________   PHYSICAL EXAM:  VITAL SIGNS: ED Triage Vitals  Enc Vitals Group     BP 04/19/21 2050 140/81     Pulse Rate 04/19/21 2050 80     Resp 04/19/21 2050 18     Temp 04/19/21 2050 98.6 F (37 C)     Temp src --      SpO2 04/19/21 2050 98 %     Weight 04/19/21 2045 119 lb (54 kg)     Height  04/19/21 2045 5\' 6"  (1.676 m)     Head Circumference --      Peak Flow --      Pain Score 04/19/21 2045 5     Pain Loc --      Pain Edu? --      Excl. in GC? --    CONSTITUTIONAL: Alert and oriented person only.  Elderly. HEAD: Normocephalic; abrasion to the posterior scalp EYES: Conjunctivae clear, PERRL, EOMI ENT: normal nose; no rhinorrhea; moist mucous membranes; pharynx without lesions noted; no dental injury; no septal hematoma NECK: Supple, no meningismus, no LAD; no midline spinal tenderness, step-off or deformity; trachea midline CARD: Irregularly irregular and rate controlled; S1 and S2 appreciated; no murmurs, no clicks, no rubs, no gallops RESP: Normal chest excursion without splinting or tachypnea; breath sounds clear and equal bilaterally; no wheezes, no rhonchi, no rales; no hypoxia or respiratory distress CHEST:  chest wall stable, no crepitus or ecchymosis or deformity, nontender to palpation; no flail chest ABD/GI: Normal bowel sounds; non-distended; soft, non-tender, no rebound, no guarding; no ecchymosis or other lesions noted PELVIS:  stable, nontender to palpation BACK:  The back appears normal and is non-tender to palpation, there is no CVA tenderness; no midline spinal tenderness, step-off or deformity EXT: Normal ROM in all joints; non-tender to palpation; no edema;  normal capillary refill; no cyanosis, no bony tenderness or bony deformity of patient's extremities, no joint effusion, compartments are soft, extremities are warm and well-perfused, no ecchymosis SKIN: Normal color for age and race; warm NEURO: Moves all extremities equally, no facial asymmetry, normal speech PSYCH: The patient's mood and manner are appropriate. Grooming and personal hygiene are appropriate.  ____________________________________________   LABS (all labs ordered are listed, but only abnormal results are displayed)  Labs Reviewed  BASIC METABOLIC PANEL - Abnormal; Notable for the following components:      Result Value   Glucose, Bld 147 (*)    BUN 26 (*)    All other components within normal limits  CBC  URINALYSIS, ROUTINE W REFLEX MICROSCOPIC  TROPONIN I (HIGH SENSITIVITY)  TROPONIN I (HIGH SENSITIVITY)   ____________________________________________  EKG   Date: 04/19/2021  Rate: 90  Rhythm: a fib  QRS Axis: normal  Intervals: normal  ST/T Wave abnormalities: normal  Conduction Disutrbances: none  Narrative Interpretation: a fib    ____________________________________________  RADIOLOGY I, Jayli Fogleman, personally viewed and evaluated these images (plain radiographs) as part of my medical decision making, as well as reviewing the written report by the radiologist.  ED MD interpretation: CT head and cervical spine show no acute traumatic injury.  Official radiology report(s): CT Head Wo Contrast  Result Date: 04/19/2021 CLINICAL DATA:  Hit head on table, posterior laceration EXAM: CT HEAD WITHOUT CONTRAST TECHNIQUE: Contiguous axial images were obtained from the base of the skull through the vertex without intravenous contrast. COMPARISON:  04/13/2021 FINDINGS: Brain: No acute infarct or hemorrhage. Lateral ventricles and midline structures are unremarkable. Stable atrophy. No acute extra-axial fluid collections. No mass effect. Vascular: No hyperdense  vessel or unexpected calcification. Skull: Normal. Negative for fracture or focal lesion. Sinuses/Orbits: Mild polypoid mucosal thickening within the sphenoid and right maxillary sinuses. Other: None. IMPRESSION: 1. Stable head CT, no acute intracranial process. Electronically Signed   By: Sharlet Salina M.D.   On: 04/19/2021 21:32   CT Cervical Spine Wo Contrast  Result Date: 04/19/2021 CLINICAL DATA:  Hit head on table, posterior laceration EXAM: CT CERVICAL SPINE WITHOUT CONTRAST TECHNIQUE:  Multidetector CT imaging of the cervical spine was performed without intravenous contrast. Multiplanar CT image reconstructions were also generated. COMPARISON:  04/13/2021 FINDINGS: Alignment: Alignment is anatomic. Skull base and vertebrae: No acute fracture. No primary bone lesion or focal pathologic process. Soft tissues and spinal canal: No prevertebral fluid or swelling. No visible canal hematoma. Disc levels: Stable multilevel spondylosis greatest at C5-6 and C6-7. Mild diffuse facet hypertrophy unchanged. Upper chest: Airway is patent. Visualized portions of the lung apices are clear. Other: Reconstructed images demonstrate no additional findings. IMPRESSION: 1. No acute cervical spine fracture. 2. Stable multilevel spondylosis and facet hypertrophy. Electronically Signed   By: Sharlet Salina M.D.   On: 04/19/2021 21:29   DG Hip Unilat W or Wo Pelvis 2-3 Views Left  Result Date: 04/19/2021 CLINICAL DATA:  Fall EXAM: DG HIP (WITH OR WITHOUT PELVIS) 2-3V LEFT COMPARISON:  None. FINDINGS: There is no evidence of hip fracture or dislocation. There is no evidence of arthropathy or other focal bone abnormality. IMPRESSION: Negative. Electronically Signed   By: Deatra Robinson M.D.   On: 04/19/2021 02:03    ____________________________________________   PROCEDURES  Procedure(s) performed (including Critical Care):  Procedures    ____________________________________________   INITIAL IMPRESSION /  ASSESSMENT AND PLAN / ED COURSE  As part of my medical decision making, I reviewed the following data within the electronic MEDICAL RECORD NUMBER History obtained from family, Nursing notes reviewed and incorporated, Labs reviewed , EKG interpreted , Old EKG reviewed, Old chart reviewed, CTs reviewed, and Notes from prior ED visits         Patient here with fall from nursing facility.  No family at bedside.  Patient with dementia and unable to provide history.  Nursing home staff contacted and unable to give report.  She has an abrasion to the posterior scalp but no other sign of traumatic injury.  She denies any pain.  CT head and cervical spine show no acute traumatic abnormality.  EKG appears to be at her baseline.  No new ischemic change, arrhythmia.  Labs reassuring.  Will obtain urinalysis given this is now her third fall in a week.  Anticipate if work-up is unremarkable that she will be able to be discharged back to her nursing facility.  ED PROGRESS  Patient's urine shows no sign of infection.  Patient was able to ambulate here with a walker without difficulty and daughter is now at bedside stating that she appears to be at her baseline.  Daughter states she is concerned because she has having increasing falls over the past few weeks.  She does have a walker at her nursing facility and is in the memory care unit.  She states they are working to find a sitter to be with her 24/7 to help prevent some of these falls and also trying to get her into hospice.  Discussed with daughter that I recommend close follow-up with the primary care doctor as well as social work at the assisted living facility who could potentially help arrange some of these things as an outpatient.  I also spoke to daughter about patient's Xarelto use and frequent falls.  I have recommended that she discuss this with her primary care doctor to see if they would like to continue this medication.  At this time patient does not meet  criteria for medical admission.  Will discharge back to [facility].  Daughter is comfortable with this plan.   At this time, I do not feel there is  any life-threatening condition present. I have reviewed, interpreted and discussed all results (EKG, imaging, lab, urine as appropriate) and exam findings with patient/family. I have reviewed nursing notes and appropriate previous records.  I feel the patient is safe to be discharged home without further emergent workup and can continue workup as an outpatient as needed. Discussed usual and customary return precautions. Patient/family verbalize understanding and are comfortable with this plan.  Outpatient follow-up has been provided as needed. All questions have been answered.    ____________________________________________   FINAL CLINICAL IMPRESSION(S) / ED DIAGNOSES  Final diagnoses:  Fall, initial encounter  Injury of head, initial encounter     ED Discharge Orders     None       *Please note:  Tiffany Grimes was evaluated in Emergency Department on 04/20/2021 for the symptoms described in the history of present illness. She was evaluated in the context of the global COVID-19 pandemic, which necessitated consideration that the patient might be at risk for infection with the SARS-CoV-2 virus that causes COVID-19. Institutional protocols and algorithms that pertain to the evaluation of patients at risk for COVID-19 are in a state of rapid change based on information released by regulatory bodies including the CDC and federal and state organizations. These policies and algorithms were followed during the patient's care in the ED.  Some ED evaluations and interventions may be delayed as a result of limited staffing during and the pandemic.*   Note:  This document was prepared using Dragon voice recognition software and may include unintentional dictation errors.    Sarissa Dern, Layla Maw, DO 04/20/21 0111

## 2021-04-20 NOTE — Discharge Instructions (Addendum)
I recommend close follow-up with your primary care doctor as well as the social worker at your nursing facility to help arrange increased accommodations for your mother to try to prevent her frequent falls.

## 2021-04-20 NOTE — ED Notes (Signed)
In and out cath performed by this RN and [place], Charity fundraiser. Pt tolerated well. Pt provided with 2 warm blankets for comfort.

## 2021-04-20 NOTE — ED Notes (Signed)
Pt ambulated with walker by this RN and [place], Charity fundraiser. Per daughter at bedside pt at baseline with walker.

## 2021-04-20 NOTE — ED Notes (Signed)
Called EMS for pt transport back to [facility]

## 2021-04-21 ENCOUNTER — Other Ambulatory Visit: Payer: Self-pay

## 2021-04-21 ENCOUNTER — Encounter: Payer: Self-pay | Admitting: Intensive Care

## 2021-04-21 ENCOUNTER — Emergency Department
Admission: EM | Admit: 2021-04-21 | Discharge: 2021-04-21 | Disposition: A | Discharge status: Home / Self Care | Payer: Medicare Other | Source: Home / Self Care | Attending: Emergency Medicine | Admitting: Emergency Medicine

## 2021-04-21 DIAGNOSIS — E1122 Type 2 diabetes mellitus with diabetic chronic kidney disease: Secondary | ICD-10-CM | POA: Insufficient documentation

## 2021-04-21 DIAGNOSIS — I129 Hypertensive chronic kidney disease with stage 1 through stage 4 chronic kidney disease, or unspecified chronic kidney disease: Secondary | ICD-10-CM | POA: Insufficient documentation

## 2021-04-21 DIAGNOSIS — Z79899 Other long term (current) drug therapy: Secondary | ICD-10-CM | POA: Insufficient documentation

## 2021-04-21 DIAGNOSIS — E039 Hypothyroidism, unspecified: Secondary | ICD-10-CM | POA: Insufficient documentation

## 2021-04-21 DIAGNOSIS — N183 Chronic kidney disease, stage 3 unspecified: Secondary | ICD-10-CM | POA: Insufficient documentation

## 2021-04-21 DIAGNOSIS — F0390 Unspecified dementia without behavioral disturbance: Secondary | ICD-10-CM | POA: Insufficient documentation

## 2021-04-21 DIAGNOSIS — Z7901 Long term (current) use of anticoagulants: Secondary | ICD-10-CM | POA: Insufficient documentation

## 2021-04-21 DIAGNOSIS — I4891 Unspecified atrial fibrillation: Secondary | ICD-10-CM | POA: Insufficient documentation

## 2021-04-21 DIAGNOSIS — R319 Hematuria, unspecified: Secondary | ICD-10-CM | POA: Insufficient documentation

## 2021-04-21 DIAGNOSIS — N3090 Cystitis, unspecified without hematuria: Secondary | ICD-10-CM

## 2021-04-21 LAB — CBC
HCT: 41.5 % (ref 36.0–46.0)
Hemoglobin: 13.5 g/dL (ref 12.0–15.0)
MCH: 29.2 pg (ref 26.0–34.0)
MCHC: 32.5 g/dL (ref 30.0–36.0)
MCV: 89.6 fL (ref 80.0–100.0)
Platelets: 291 10*3/uL (ref 150–400)
RBC: 4.63 MIL/uL (ref 3.87–5.11)
RDW: 13.2 % (ref 11.5–15.5)
WBC: 7.7 10*3/uL (ref 4.0–10.5)
nRBC: 0 % (ref 0.0–0.2)

## 2021-04-21 LAB — BASIC METABOLIC PANEL
Anion gap: 9 (ref 5–15)
BUN: 20 mg/dL (ref 8–23)
CO2: 30 mmol/L (ref 22–32)
Calcium: 9.3 mg/dL (ref 8.9–10.3)
Chloride: 97 mmol/L — ABNORMAL LOW (ref 98–111)
Creatinine, Ser: 0.9 mg/dL (ref 0.44–1.00)
GFR, Estimated: >60 mL/min (ref >60)
Glucose, Bld: 135 mg/dL — ABNORMAL HIGH (ref 70–99)
Potassium: 4.6 mmol/L (ref 3.5–5.1)
Sodium: 136 mmol/L (ref 135–145)

## 2021-04-21 LAB — URINALYSIS, COMPLETE (UACMP) WITH MICROSCOPIC
Bilirubin Urine: NEGATIVE
Glucose, UA: NEGATIVE mg/dL
Ketones, ur: NEGATIVE mg/dL
Nitrite: POSITIVE — AB
Protein, ur: 30 mg/dL — AB
Specific Gravity, Urine: 1.015 (ref 1.005–1.030)
pH: 8 (ref 5.0–8.0)

## 2021-04-21 MED ORDER — SODIUM CHLORIDE 0.9 % IV BOLUS
1000.0000 mL | Freq: Once | INTRAVENOUS | Status: AC
  Administered 2021-04-21: 1000 mL via INTRAVENOUS

## 2021-04-21 MED ORDER — CEPHALEXIN 500 MG PO CAPS: 500.0000 mg | ORAL_CAPSULE | Freq: Three times a day (TID) | ORAL | 0 refills | Status: DC

## 2021-04-21 NOTE — ED Triage Notes (Signed)
Pt in via EMS from [facility]. Per nursing staff they noticed blood in her urine this am. Pt denies complaints and is at baseline mentally and physically. Family on the way.

## 2021-04-21 NOTE — ED Triage Notes (Signed)
Patient arrived by EMS from [facility] for Blood in urine noticed by staff this AM. HX dementia. Unable to answer questions appropriately.

## 2021-04-21 NOTE — Discharge Instructions (Addendum)
Your urine test shows a bladder infection.  Your other lab tests are reassuring.  Take the antibiotic as prescribed for the next week and follow-up with your doctor for a recheck.

## 2021-04-21 NOTE — ED Provider Notes (Signed)
[facility]  ____________________________________________   Event Date/Time   First MD Initiated Contact with Patient 04/21/21 1308     (approximate)  I have reviewed the triage vital signs and the nursing notes.   HISTORY  Chief Complaint Hematuria    HPI SHECCID LAHMANN is a 87 y.o. female past medical history of atrial fibrillation on Xarelto, hypertension, hypothyroidism, dementia, frequent falls who presents with hematuria.  Patient resides in a nursing facility.  They were concerned because Tiffany Grimes had blood in Tiffany Grimes urine.  Patient's daughter is at bedside.  Notes that the patient over the last several weeks has had multiple falls.  They are now transitioning Tiffany Grimes to hospice.  No longer allowing Tiffany Grimes to ambulate on Tiffany Grimes own due to significant instability.  At baseline Tiffany Grimes is confused.  Today Tiffany Grimes was sleeping more than normal.  Otherwise but has been on Tiffany Grimes baseline.  Patient is able to tell me that Tiffany Grimes has no back or abdominal pain.  Otherwise difficult to obtain significant history due to Tiffany Grimes dementia.         Past Medical History:  Diagnosis Date   Atrial fibrillation [facility])    Atrial fibrillation (HCC)    Barrett esophagus    due for EGD July 2012, [place]   Dysrhythmia    Hx of thyroid nodule July 2012   benign biopsy    Hypertension    Hypothyroidism    Lentigo    Reflux    Thyroid disease    TIA (transient ischemic attack) June 2012   TIA (transient ischemic attack) June 2012   Wears dentures    partial upper   Wears hearing aid     Patient Active Problem List   Diagnosis Date Noted   Coronary artery disease 07/21/2018   History of TIA (transient ischemic attack) 07/21/2018   Pulmonary hypertension (HCC) 07/21/2018   Type 2 diabetes mellitus with stage 3 chronic kidney disease, without long-term current use of insulin (HCC) 05/06/2018   Mild protein-calorie malnutrition (HCC) 02/10/2018   Carotid artery stenosis 02/28/2017    Alpha-1-antitrypsin deficiency (HCC) 10/19/2015   History of depression 10/19/2015   Underweight 07/30/2015   Furrowed tongue 07/30/2015   Benign essential HTN 05/11/2014   Severe mitral insufficiency 05/11/2014   Centrilobular emphysema (HCC) 04/18/2014   Alpha-1-antitrypsin deficiency carrier 04/18/2014   Unspecified hypothyroidism 04/07/2014   Multiple pulmonary nodules 10/05/2013   Pulmonary nodules/lesions, multiple 08/31/2013   PAD (peripheral artery disease) (HCC) 08/08/2013   External hemorrhoid 08/02/2013   Anal fissure 08/02/2013   Ecchymosis 05/10/2013   Anxiety state 10/28/2012   Routine general medical examination at a health care facility 06/15/2012   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   Osteoporosis, post-menopausal 02/11/2012   Lumbago with sciatica of right side 02/11/2012   Posterior neck pain 02/11/2012   Hyperlipidemia LDL goal <100 12/16/2011   Hx of thyroid nodule    Atrial fibrillation, new onset (HCC) 08/02/2011   Chest pain, atypical 08/02/2011   TIA (transient ischemic attack)    Barrett esophagus    Hypertension 03/29/2011    Past Surgical History:  Procedure Laterality Date   ABDOMINAL HYSTERECTOMY     CATARACT EXTRACTION W/PHACO Right 07/29/2016   Procedure: CATARACT EXTRACTION PHACO AND INTRAOCULAR LENS PLACEMENT (IOC);  Surgeon: Sherald Hess, MD;  Location: [facility];  Service: Ophthalmology;  Laterality: Right;  right   CATARACT EXTRACTION W/PHACO Left 07/02/2017   Procedure: CATARACT EXTRACTION PHACO AND INTRAOCULAR LENS PLACEMENT (IOC);  Surgeon: Nevada Crane, MD;  Location: [facility];  Service: Ophthalmology;  Laterality: Left;  [place] 00:50.9 AP% 14.8 CDE 7.54 Fluid Pack Lot # [id number]   CESAREAN SECTION     HERNIA REPAIR  2008   left inguinal   THYROID SURGERY  june 18   nodule removed    Prior to Admission medications   Medication Sig Start Date End Date Taking? Authorizing Provider  amLODipine  (NORVASC) 5 MG tablet TAKE 1 TABLET BY MOUTH DAILY. 06/11/16   Sherlene Shams, MD  atenolol (TENORMIN) 25 MG tablet TAKE 1 TABLET TWICE A DAY Patient not taking: No sig reported 12/21/15   Sherlene Shams, MD  atorvastatin (LIPITOR) 20 MG tablet TAKE 1 TABLET DAILY 07/14/15   Sherlene Shams, MD  azelastine (ASTELIN) 0.1 % nasal spray Place 1 spray into both nostrils 2 (two) times daily. Use in each nostril as directed Patient not taking: No sig reported    [provider]  cloNIDine (CATAPRES) 0.1 MG tablet Take 0.1 mg by mouth 2 (two) times daily.  12/17/17   [provider]  esomeprazole (NEXIUM) 40 MG capsule TAKE 1 CAPSULE DAILY BEFORE BREAKFAST 04/01/16   Sherlene Shams, MD  furosemide (LASIX) 20 MG tablet Take 20 mg by mouth daily as needed for edema. 09/02/18 04/13/21  [provider]  hydrOXYzine (ATARAX/VISTARIL) 25 MG tablet Take 25 mg by mouth 3 (three) times daily as needed.    [provider]  latanoprost (XALATAN) 0.005 % ophthalmic solution Place 1 drop into both eyes at bedtime.     [provider]  levothyroxine (SYNTHROID) 100 MCG tablet Take 100 mcg by mouth daily before breakfast.    [provider]  lisinopril (PRINIVIL,ZESTRIL) 20 MG tablet Take 20 mg by mouth 2 (two) times daily.    [provider]  LORazepam (ATIVAN) 0.5 MG tablet Take 0.5 mg by mouth 3 (three) times daily as needed. 03/30/21   [provider]  polyethylene glycol (MIRALAX / GLYCOLAX) 17 g packet Take 17 g by mouth daily. Patient not taking: No sig reported 10/04/19   Emily Filbert, MD  QUEtiapine (SEROQUEL) 25 MG tablet Take 25 mg by mouth 2 (two) times daily.    [provider]  sucralfate (CARAFATE) 1 g tablet Take 1 tablet (1 g total) by mouth 2 (two) times daily. Patient not taking: No sig reported 08/28/15   Sherlene Shams, MD  XARELTO 15 MG TABS tablet TAKE 1 TABLET DAILY 11/06/15   Sherlene Shams, MD     Allergies Neomycin  Family History  Problem Relation Age of Onset   Hypertension Mother    Stroke Mother    Heart disease Father 67       AMI   Hypertension Father    Vision loss Paternal Aunt    Cancer Neg Hx     Social History Social History   Tobacco Use   Smoking status: Never   Smokeless tobacco: Never  Vaping Use   Vaping Use: Never used  Substance Use Topics   Alcohol use: No   Drug use: No    Review of Systems   Review of Systems  Unable to perform ROS: Dementia   Physical Exam Updated Vital Signs BP 128/81   Pulse 77   Temp 97.8 F (36.6 C) (Oral)   Resp 13   Ht 6' (1.829 m)   Wt 54 kg   SpO2 96%   BMI 16.14 kg/m  Physical Exam Vitals and nursing note reviewed.  Constitutional:      General: Tiffany Grimes is not in acute distress.    Appearance: Normal appearance.  HENT:     Head: Normocephalic.     Comments: Old ecchymosis over the forehead Eyes:     General: No scleral icterus.    Conjunctiva/sclera: Conjunctivae normal.  Pulmonary:     Effort: Pulmonary effort is normal. No respiratory distress.     Breath sounds: No stridor.  Abdominal:     General: Abdomen is flat. There is no distension.     Palpations: Abdomen is soft.     Tenderness: There is no abdominal tenderness. There is no right CVA tenderness, left CVA tenderness or guarding.  Musculoskeletal:        General: No deformity or signs of injury.     Cervical back: Normal range of motion.  Skin:    General: Skin is dry.     Coloration: Skin is not jaundiced or pale.  Neurological:     Mental Status: Tiffany Grimes is alert.     Comments: Patient is sleeping comfortably, opens eyes to voice, provides a simple history, does follow simple commands Moves all extremities spontaneously  Psychiatric:        Mood and Affect: Mood normal.        Behavior: Behavior normal.     LABS (all labs ordered are listed, but only abnormal results are displayed)  Labs Reviewed  BASIC METABOLIC PANEL -  Abnormal; Notable for the following components:      Result Value   Chloride 97 (*)    Glucose, Bld 135 (*)    All other components within normal limits  CBC  URINALYSIS, COMPLETE (UACMP) WITH MICROSCOPIC   ____________________________________________  EKG  N/a ____________________________________________  RADIOLOGY Ky Barban, personally viewed and evaluated these images (plain radiographs) as part of my medical decision making, as well as reviewing the written report by the radiologist.  ED MD interpretation:  n/a    ____________________________________________   PROCEDURES  Procedure(s) performed (including Critical Care):  Procedures   ____________________________________________   INITIAL IMPRESSION / ASSESSMENT AND PLAN / ED COURSE     Patient is a 87 year old female history of dementia, frequent falls now being transitioned to hospice who presents with hematuria from nursing facility.  Patient's daughter is at bedside.  Seems like Tiffany Grimes has had a decline over the past month with frequent ED visits for falls.  Now going to be transition to hospice.  Today Tiffany Grimes is at Tiffany Grimes baseline, other than being more tired than normal.  On exam Tiffany Grimes has benign abdomen, no CVA tenderness.  Does have some old bruising of note has not had any new falls since the time Tiffany Grimes was in the ED.  Tiffany Grimes CBC and CMP are reassuring, normal hemoglobin, normal renal function.  We will obtain a UA to rule out UTI.  Nursing attempted in and out straight cath but were unable to locate the urethra.  Pure wick placed we will give the patient a fluid bolus.  At the time of signout Tiffany Grimes is pending a UA, then discharged back to Tiffany Grimes facility.      ____________________________________________   FINAL CLINICAL IMPRESSION(S) / ED DIAGNOSES  Final diagnoses:  Hematuria, unspecified type     ED Discharge Orders     None        Note:  This document was prepared using Dragon voice recognition  software and may include unintentional dictation errors.  Georga Hacking, MD 04/21/21 [phone]

## 2021-04-22 ENCOUNTER — Encounter: Payer: Self-pay | Admitting: Emergency Medicine

## 2021-04-22 ENCOUNTER — Inpatient Hospital Stay
Admission: EM | Admit: 2021-04-22 | Discharge: 2021-04-24 | DRG: 871 | Disposition: A | Discharge status: Hospice | Payer: Medicare Other | Attending: Internal Medicine | Admitting: Internal Medicine

## 2021-04-22 ENCOUNTER — Other Ambulatory Visit: Payer: Self-pay

## 2021-04-22 ENCOUNTER — Emergency Department: Payer: Medicare Other

## 2021-04-22 DIAGNOSIS — Z90710 Acquired absence of both cervix and uterus: Secondary | ICD-10-CM

## 2021-04-22 DIAGNOSIS — Z8673 Personal history of transient ischemic attack (TIA), and cerebral infarction without residual deficits: Secondary | ICD-10-CM | POA: Diagnosis not present

## 2021-04-22 DIAGNOSIS — E039 Hypothyroidism, unspecified: Secondary | ICD-10-CM | POA: Diagnosis present

## 2021-04-22 DIAGNOSIS — I129 Hypertensive chronic kidney disease with stage 1 through stage 4 chronic kidney disease, or unspecified chronic kidney disease: Secondary | ICD-10-CM | POA: Diagnosis present

## 2021-04-22 DIAGNOSIS — N1831 Chronic kidney disease, stage 3a: Secondary | ICD-10-CM | POA: Diagnosis not present

## 2021-04-22 DIAGNOSIS — R296 Repeated falls: Secondary | ICD-10-CM | POA: Diagnosis present

## 2021-04-22 DIAGNOSIS — Z974 Presence of external hearing-aid: Secondary | ICD-10-CM | POA: Diagnosis not present

## 2021-04-22 DIAGNOSIS — E785 Hyperlipidemia, unspecified: Secondary | ICD-10-CM | POA: Diagnosis present

## 2021-04-22 DIAGNOSIS — Z20822 Contact with and (suspected) exposure to covid-19: Secondary | ICD-10-CM | POA: Diagnosis present

## 2021-04-22 DIAGNOSIS — Z823 Family history of stroke: Secondary | ICD-10-CM

## 2021-04-22 DIAGNOSIS — Z7901 Long term (current) use of anticoagulants: Secondary | ICD-10-CM

## 2021-04-22 DIAGNOSIS — Z79899 Other long term (current) drug therapy: Secondary | ICD-10-CM | POA: Diagnosis not present

## 2021-04-22 DIAGNOSIS — A419 Sepsis, unspecified organism: Secondary | ICD-10-CM | POA: Diagnosis present

## 2021-04-22 DIAGNOSIS — J189 Pneumonia, unspecified organism: Secondary | ICD-10-CM | POA: Diagnosis present

## 2021-04-22 DIAGNOSIS — K2270 Barrett's esophagus without dysplasia: Secondary | ICD-10-CM | POA: Diagnosis present

## 2021-04-22 DIAGNOSIS — F0392 Unspecified dementia, unspecified severity, with psychotic disturbance: Secondary | ICD-10-CM | POA: Diagnosis present

## 2021-04-22 DIAGNOSIS — E1122 Type 2 diabetes mellitus with diabetic chronic kidney disease: Secondary | ICD-10-CM | POA: Diagnosis present

## 2021-04-22 DIAGNOSIS — N183 Chronic kidney disease, stage 3 unspecified: Secondary | ICD-10-CM | POA: Diagnosis present

## 2021-04-22 DIAGNOSIS — Z66 Do not resuscitate: Secondary | ICD-10-CM | POA: Diagnosis present

## 2021-04-22 DIAGNOSIS — E1151 Type 2 diabetes mellitus with diabetic peripheral angiopathy without gangrene: Secondary | ICD-10-CM | POA: Diagnosis present

## 2021-04-22 DIAGNOSIS — Z881 Allergy status to other antibiotic agents status: Secondary | ICD-10-CM

## 2021-04-22 DIAGNOSIS — Z79890 Hormone replacement therapy (postmenopausal): Secondary | ICD-10-CM | POA: Diagnosis not present

## 2021-04-22 DIAGNOSIS — N390 Urinary tract infection, site not specified: Secondary | ICD-10-CM | POA: Diagnosis present

## 2021-04-22 DIAGNOSIS — Z515 Encounter for palliative care: Secondary | ICD-10-CM | POA: Diagnosis not present

## 2021-04-22 DIAGNOSIS — J432 Centrilobular emphysema: Secondary | ICD-10-CM | POA: Diagnosis present

## 2021-04-22 DIAGNOSIS — R52 Pain, unspecified: Secondary | ICD-10-CM

## 2021-04-22 DIAGNOSIS — Z8249 Family history of ischemic heart disease and other diseases of the circulatory system: Secondary | ICD-10-CM

## 2021-04-22 DIAGNOSIS — K219 Gastro-esophageal reflux disease without esophagitis: Secondary | ICD-10-CM | POA: Diagnosis present

## 2021-04-22 DIAGNOSIS — I10 Essential (primary) hypertension: Secondary | ICD-10-CM | POA: Diagnosis present

## 2021-04-22 DIAGNOSIS — I4891 Unspecified atrial fibrillation: Secondary | ICD-10-CM | POA: Diagnosis present

## 2021-04-22 DIAGNOSIS — F03918 Unspecified dementia, unspecified severity, with other behavioral disturbance: Secondary | ICD-10-CM | POA: Diagnosis not present

## 2021-04-22 DIAGNOSIS — M810 Age-related osteoporosis without current pathological fracture: Secondary | ICD-10-CM | POA: Diagnosis present

## 2021-04-22 DIAGNOSIS — Z972 Presence of dental prosthetic device (complete) (partial): Secondary | ICD-10-CM

## 2021-04-22 LAB — CBC WITH DIFFERENTIAL/PLATELET
Abs Immature Granulocytes: 0.02 10*3/uL (ref 0.00–0.07)
Basophils Absolute: 0 10*3/uL (ref 0.0–0.1)
Basophils Relative: 0 %
Eosinophils Absolute: 0 10*3/uL (ref 0.0–0.5)
Eosinophils Relative: 0 %
HCT: 34.6 % — ABNORMAL LOW (ref 36.0–46.0)
Hemoglobin: 11.8 g/dL — ABNORMAL LOW (ref 12.0–15.0)
Immature Granulocytes: 0 %
Lymphocytes Relative: 2 %
Lymphs Abs: 0.2 10*3/uL — ABNORMAL LOW (ref 0.7–4.0)
MCH: 30.5 pg (ref 26.0–34.0)
MCHC: 34.1 g/dL (ref 30.0–36.0)
MCV: 89.4 fL (ref 80.0–100.0)
Monocytes Absolute: 0.3 10*3/uL (ref 0.1–1.0)
Monocytes Relative: 3 %
Neutro Abs: 8.3 10*3/uL — ABNORMAL HIGH (ref 1.7–7.7)
Neutrophils Relative %: 95 %
Platelets: 260 10*3/uL (ref 150–400)
RBC: 3.87 MIL/uL (ref 3.87–5.11)
RDW: 13 % (ref 11.5–15.5)
WBC: 8.8 10*3/uL (ref 4.0–10.5)
nRBC: 0 % (ref 0.0–0.2)

## 2021-04-22 LAB — RESP PANEL BY RT-PCR (FLU A&B, COVID) ARPGX2
Influenza A by PCR: NEGATIVE
Influenza B by PCR: NEGATIVE
SARS Coronavirus 2 by RT PCR: NEGATIVE

## 2021-04-22 LAB — PROTIME-INR
INR: 2 — ABNORMAL HIGH (ref 0.8–1.2)
Prothrombin Time: 22.6 seconds — ABNORMAL HIGH (ref 11.4–15.2)

## 2021-04-22 LAB — COMPREHENSIVE METABOLIC PANEL
ALT: 17 U/L (ref 0–44)
AST: 26 U/L (ref 15–41)
Albumin: 3 g/dL — ABNORMAL LOW (ref 3.5–5.0)
Alkaline Phosphatase: 92 U/L (ref 38–126)
Anion gap: 8 (ref 5–15)
BUN: 22 mg/dL (ref 8–23)
CO2: 26 mmol/L (ref 22–32)
Calcium: 8.2 mg/dL — ABNORMAL LOW (ref 8.9–10.3)
Chloride: 101 mmol/L (ref 98–111)
Creatinine, Ser: 0.9 mg/dL (ref 0.44–1.00)
GFR, Estimated: >60 mL/min (ref >60)
Glucose, Bld: 155 mg/dL — ABNORMAL HIGH (ref 70–99)
Potassium: 4.5 mmol/L (ref 3.5–5.1)
Sodium: 135 mmol/L (ref 135–145)
Total Bilirubin: 1.4 mg/dL — ABNORMAL HIGH (ref 0.3–1.2)
Total Protein: 5.3 g/dL — ABNORMAL LOW (ref 6.5–8.1)

## 2021-04-22 LAB — GLUCOSE, CAPILLARY
Glucose-Capillary: 162 mg/dL — ABNORMAL HIGH (ref 70–99)
Glucose-Capillary: 167 mg/dL — ABNORMAL HIGH (ref 70–99)
Glucose-Capillary: 187 mg/dL — ABNORMAL HIGH (ref 70–99)

## 2021-04-22 LAB — LACTIC ACID, PLASMA
Lactic Acid, Venous: 2.7 mmol/L (ref 0.5–1.9)
Lactic Acid, Venous: 1.7 mmol/L (ref 0.5–1.9)

## 2021-04-22 MED ORDER — MORPHINE SULFATE (PF) 4 MG/ML IV SOLN
4.0000 mg | INTRAVENOUS | Status: DC | PRN
  Administered 2021-04-22 – 2021-04-24 (×3): 4 mg via INTRAVENOUS
  Filled 2021-04-22 (×3): qty 1

## 2021-04-22 MED ORDER — SODIUM CHLORIDE 0.9 % IV SOLN: 500.0000 mg | INTRAVENOUS | Status: DC

## 2021-04-22 MED ORDER — CEFTRIAXONE SODIUM 2 G IJ SOLR
2.0000 g | INTRAMUSCULAR | Status: DC
Start: 2021-04-22 — End: 2021-04-22
  Filled 2021-04-22: qty 20

## 2021-04-22 MED ORDER — VANCOMYCIN HCL 1250 MG/250ML IV SOLN
1250.0000 mg | Freq: Once | INTRAVENOUS | Status: AC
  Administered 2021-04-22: 1250 mg via INTRAVENOUS
  Filled 2021-04-22: qty 250

## 2021-04-22 MED ORDER — ACETAMINOPHEN 650 MG RE SUPP: 650.0000 mg | Freq: Four times a day (QID) | RECTAL | Status: DC | PRN

## 2021-04-22 MED ORDER — ONDANSETRON HCL 4 MG/2ML IJ SOLN: 4.0000 mg | Freq: Four times a day (QID) | INTRAMUSCULAR | Status: DC | PRN

## 2021-04-22 MED ORDER — LORAZEPAM 2 MG/ML IJ SOLN: 0.5000 mg | INTRAMUSCULAR | Status: DC | PRN

## 2021-04-22 MED ORDER — ENOXAPARIN SODIUM 40 MG/0.4ML IJ SOSY
40.0000 mg | PREFILLED_SYRINGE | INTRAMUSCULAR | Status: DC
  Filled 2021-04-22: qty 0.4

## 2021-04-22 MED ORDER — ENOXAPARIN SODIUM 40 MG/0.4ML IJ SOSY: 40.0000 mg | PREFILLED_SYRINGE | INTRAMUSCULAR | Status: DC

## 2021-04-22 MED ORDER — LACTATED RINGERS IV SOLN: INTRAVENOUS | Status: DC

## 2021-04-22 MED ORDER — METRONIDAZOLE 500 MG/100ML IV SOLN
500.0000 mg | Freq: Once | INTRAVENOUS | Status: AC
  Administered 2021-04-22: 500 mg via INTRAVENOUS
  Filled 2021-04-22: qty 100

## 2021-04-22 MED ORDER — VANCOMYCIN HCL IN DEXTROSE 1-5 GM/200ML-% IV SOLN: 1000.0000 mg | Freq: Once | INTRAVENOUS | Status: DC

## 2021-04-22 MED ORDER — ACETAMINOPHEN 325 MG PO TABS: 650.0000 mg | ORAL_TABLET | Freq: Four times a day (QID) | ORAL | Status: DC | PRN

## 2021-04-22 MED ORDER — SODIUM CHLORIDE 0.9 % IV BOLUS (SEPSIS)
500.0000 mL | Freq: Once | INTRAVENOUS | Status: AC
Start: 2021-04-22 — End: 2021-04-22
  Administered 2021-04-22: 500 mL via INTRAVENOUS

## 2021-04-22 MED ORDER — ACETAMINOPHEN 325 MG RE SUPP
650.0000 mg | Freq: Once | RECTAL | Status: AC
  Administered 2021-04-22: 650 mg via RECTAL

## 2021-04-22 MED ORDER — INSULIN ASPART 100 UNIT/ML IJ SOLN
0.0000 [IU] | Freq: Three times a day (TID) | INTRAMUSCULAR | Status: DC
  Filled 2021-04-22: qty 1

## 2021-04-22 MED ORDER — CEFEPIME HCL 2 G IJ SOLR
2.0000 g | Freq: Once | INTRAMUSCULAR | Status: AC
Start: 2021-04-22 — End: 2021-04-22
  Administered 2021-04-22: 2 g via INTRAVENOUS
  Filled 2021-04-22: qty 2

## 2021-04-22 MED ORDER — ONDANSETRON HCL 4 MG PO TABS: 4.0000 mg | ORAL_TABLET | Freq: Four times a day (QID) | ORAL | Status: DC | PRN

## 2021-04-22 NOTE — H&P (Signed)
History and Physical    Tiffany Grimes:[id number] DOB: 1929/03/15 DOA: 04/22/2021  PCP: Kirk Ruths, MD   Patient coming from: long term care  I have personally briefly reviewed patient's old medical records in [place]  Chief Complaint: fever, N/V  HPI: Tiffany Grimes is a 87 y.o. female with medical history significant of advanced dementia, hypertension, diabetes, presents to the emergency department for fever nausea and vomiting.  Patient has had a declining course. She has had multiple falls over the past 10 days with several ED evaluations. According to the daughter and record review patient was seen in the emergency department yesterday was diagnosed with urinary tract infection.  Antibiotics were called in but they had not yet picked the antibiotics up.  This evening while at her nursing facility vomited and was found to be febrile.     ED Course: Tmax 101.8  103/57  HR 94  RR 16. EDP exam - no focal findings. Lab: glucose 155, Lactic acid 2.7 - 1.7  WBC 8.8 w/ 95/2/3. U/A w/ 21-50 WBC/hpf, many bacteria. CXR extensive air space disease right lung c/w PNA. In ED code sepsis called: 1.5 L LR given, abx - Vanc, Flagyl, Rocephin. Patient's temp came down, lactic acid improved. TRH called to admit for continued management  Review of Systems: As per HPI otherwise 10 point review of systems negative.    Past Medical History:  Diagnosis Date   Atrial fibrillation [facility])    Atrial fibrillation ([place])    Barrett esophagus    due for EGD July 2012, [place]   Dysrhythmia    Hx of thyroid nodule July 2012   benign biopsy    Hypertension    Hypothyroidism    Lentigo    Reflux    Thyroid disease    TIA (transient ischemic attack) June 2012   TIA (transient ischemic attack) June 2012   Wears dentures    partial upper   Wears hearing aid     Past Surgical History:  Procedure Laterality Date   ABDOMINAL HYSTERECTOMY     CATARACT EXTRACTION W/PHACO Right 07/29/2016    Procedure: CATARACT EXTRACTION PHACO AND INTRAOCULAR LENS PLACEMENT (IOC);  Surgeon: Ronnell Freshwater, MD;  Location: [place];  Service: Ophthalmology;  Laterality: Right;  right   CATARACT EXTRACTION W/PHACO Left 07/02/2017   Procedure: CATARACT EXTRACTION PHACO AND INTRAOCULAR LENS PLACEMENT (IOC);  Surgeon: Eulogio Bear, MD;  Location: [facility];  Service: Ophthalmology;  Laterality: Left;  [place] 00:50.9 AP% 14.8 CDE 7.54 Fluid Pack Lot # [id number]   CESAREAN SECTION     HERNIA REPAIR  2008   left inguinal   THYROID SURGERY  june 18   nodule removed    Soc Hx - married 70+ years. She has 1 son, 1 daughter. She was a full time homemaker. Due to her decline, with dementia, she has been in long term care and was to have hospice consult   reports that she has never smoked. She has never used smokeless tobacco. She reports that she does not drink alcohol and does not use drugs.  Allergies  Allergen Reactions   Neomycin Itching    Family History  Problem Relation Age of Onset   Hypertension Mother    Stroke Mother    Heart disease Father 1       AMI   Hypertension Father    Vision loss Paternal Aunt    Cancer Neg Hx  Prior to Admission medications   Medication Sig Start Date End Date Taking? Authorizing Provider  amLODipine (NORVASC) 5 MG tablet TAKE 1 TABLET BY MOUTH DAILY. 06/11/16   Crecencio Mc, MD  atenolol (TENORMIN) 25 MG tablet TAKE 1 TABLET TWICE A DAY Patient not taking: No sig reported 12/21/15   Crecencio Mc, MD  atorvastatin (LIPITOR) 20 MG tablet TAKE 1 TABLET DAILY 07/14/15   Crecencio Mc, MD  azelastine (ASTELIN) 0.1 % nasal spray Place 1 spray into both nostrils 2 (two) times daily. Use in each nostril as directed Patient not taking: No sig reported    [provider]  cephALEXin (KEFLEX) 500 MG capsule Take 1 capsule (500 mg total) by mouth 3 (three) times daily. 04/21/21   Carrie Mew, MD  cloNIDine (CATAPRES) 0.1  MG tablet Take 0.1 mg by mouth 2 (two) times daily.  12/17/17   [provider]  esomeprazole (NEXIUM) 40 MG capsule TAKE 1 CAPSULE DAILY BEFORE BREAKFAST 04/01/16   Crecencio Mc, MD  furosemide (LASIX) 20 MG tablet Take 20 mg by mouth daily as needed for edema. 09/02/18 04/13/21  [provider]  hydrOXYzine (ATARAX/VISTARIL) 25 MG tablet Take 25 mg by mouth 3 (three) times daily as needed.    [provider]  latanoprost (XALATAN) 0.005 % ophthalmic solution Place 1 drop into both eyes at bedtime.     [provider]  levothyroxine (SYNTHROID) 100 MCG tablet Take 100 mcg by mouth daily before breakfast.    [provider]  lisinopril (PRINIVIL,ZESTRIL) 20 MG tablet Take 20 mg by mouth 2 (two) times daily.    [provider]  LORazepam (ATIVAN) 0.5 MG tablet Take 0.5 mg by mouth 3 (three) times daily as needed. 03/30/21   [provider]  polyethylene glycol (MIRALAX / GLYCOLAX) 17 g packet Take 17 g by mouth daily. Patient not taking: No sig reported 10/04/19   Earleen Newport, MD  QUEtiapine (SEROQUEL) 25 MG tablet Take 25 mg by mouth 2 (two) times daily.    [provider]  sucralfate (CARAFATE) 1 g tablet Take 1 tablet (1 g total) by mouth 2 (two) times daily. Patient not taking: No sig reported 08/28/15   Crecencio Mc, MD  XARELTO 15 MG TABS tablet TAKE 1 TABLET DAILY 11/06/15   Crecencio Mc, MD    Physical Exam: Vitals:   04/22/21 0630 04/22/21 0700 04/22/21 0708 04/22/21 0850  BP: 119/70 (!) 103/57  (!) 161/76  Pulse: (!) 108 94  (!) 107  Resp: (!) _0 Temp:   98 F (36.7 C) 98.9 F (37.2 C)  TempSrc:   Oral Oral  SpO2: 90% 95% 90% 92%  Weight:      Height:        Vitals:   04/22/21 0630 04/22/21 0700 04/22/21 0708 04/22/21 0850  BP: 119/70 (!) 103/57  (!) 161/76  Pulse: (!) 108 94  (!) 107  Resp: (!) _1 Temp:   98 F (36.7 C) 98.9 F (37.2 C)  TempSrc:   Oral Oral  SpO2: 90% 95%  90% 92%  Weight:      Height:       General: Very frail appearing elderly woman who does not seem uncomfortable. Eyes: PERRL, lids and conjunctivae normal ENMT: Mucous membranes are moist. Posterior pharynx clear of any exudate or lesions.Normal dentition.  Neck: normal, supple, no masses, no thyromegaly Respiratory: No increased WOB. Dense  consolidation right chest w/o audible breath sounds. Left lung clear with good air movement.  Cardiovascular: IRIR with good rate control, no murmurs / rubs / gallops. No extremity edema. 2+ pedal pulses. No carotid bruits.  Abdomen: diffuse tenderness, no masses palpated. No hepatosplenomegaly. Bowel sounds hypoactive.  Musculoskeletal: no clubbing / cyanosis. No joint deformity upper and lower extremities. no contractures. Decreased muscle tone.  Skin: no rashes, lesions, ulcers. No induration. Resolving ecchymosis forehead/glabella Neurologic: CN 2-12 grossly intact.  Psychiatric: demented. Lacks decision making capacity.    Labs on Admission: I have personally reviewed following labs and imaging studies  CBC: Recent Labs  Lab 04/19/21 2051 04/21/21 1138 04/22/21 0331  WBC 7.6 7.7 8.8  NEUTROABS  --   --  8.3*  HGB 12.9 13.5 11.8*  HCT 38.5 41.5 34.6*  MCV 90.0 89.6 89.4  PLT 253 291 924   Basic Metabolic Panel: Recent Labs  Lab 04/19/21 2051 04/21/21 1138 04/22/21 0331  NA 137 136 135  K 4.5 4.6 4.5  CL 98 97* 101  CO2 _0 GLUCOSE 147* 135* 155*  BUN 26* 20 22  CREATININE 0.81 0.90 0.90  CALCIUM 8.9 9.3 8.2*   GFR: Estimated Creatinine Clearance: 35.4 mL/min (by C-G formula based on SCr of 0.9 mg/dL). Liver Function Tests: Recent Labs  Lab 04/22/21 0331  AST 26  ALT 17  ALKPHOS 92  BILITOT 1.4*  PROT 5.3*  ALBUMIN 3.0*   No results for input(s): LIPASE, AMYLASE in the last 168 hours. No results for input(s): AMMONIA in the last 168 hours. Coagulation Profile: Recent Labs  Lab 04/22/21 0331  INR 2.0*    Cardiac Enzymes: No results for input(s): CKTOTAL, CKMB, CKMBINDEX, TROPONINI in the last 168 hours. BNP (last 3 results) No results for input(s): PROBNP in the last 8760 hours. HbA1C: No results for input(s): HGBA1C in the last 72 hours. CBG: Recent Labs  Lab 04/22/21 0844  GLUCAP 162*   Lipid Profile: No results for input(s): CHOL, HDL, LDLCALC, TRIG, CHOLHDL, LDLDIRECT in the last 72 hours. Thyroid Function Tests: No results for input(s): TSH, T4TOTAL, FREET4, T3FREE, THYROIDAB in the last 72 hours. Anemia Panel: No results for input(s): VITAMINB12, FOLATE, FERRITIN, TIBC, IRON, RETICCTPCT in the last 72 hours. Urine analysis:    Component Value Date/Time   COLORURINE YELLOW 04/21/2021 1538   APPEARANCEUR CLOUDY (A) 04/21/2021 1538   APPEARANCEUR Clear 07/19/2014 1051   LABSPEC 1.015 04/21/2021 1538   LABSPEC 1.005 07/19/2014 1051   PHURINE 8.0 04/21/2021 1538   GLUCOSEU NEGATIVE 04/21/2021 1538   GLUCOSEU Negative 07/19/2014 1051   HGBUR LARGE (A) 04/21/2021 1538   BILIRUBINUR NEGATIVE 04/21/2021 1538   BILIRUBINUR Negative 07/19/2014 [place] 04/21/2021 1538   PROTEINUR 30 (A) 04/21/2021 1538   NITRITE POSITIVE (A) 04/21/2021 1538   LEUKOCYTESUR MODERATE (A) 04/21/2021 1538   LEUKOCYTESUR Negative 07/19/2014 1051    Radiological Exams on Admission: DG Chest Port 1 View  Result Date: 04/22/2021 CLINICAL DATA:  Fever EXAM: PORTABLE CHEST 1 VIEW COMPARISON:  12/25/2018 FINDINGS: Mild cardiomegaly. Extensive airspace opacity asymmetric to the right lung asymmetry and fever history suggesting pneumonia. Trace right pleural effusion is likely. No pneumothorax. IMPRESSION: Extensive airspace disease on the right with history implicating pneumonia. Electronically Signed   By: Jorje Guild M.D.   On: 04/22/2021 04:10    EKG: Independently reviewed. A fib with RVR, no acute changes  Assessment/Plan Active Problems:   Sepsis secondary to UTI ([place])  CAP (community acquired pneumonia)   End of life care   Presenile dementia with paranoia   Centrilobular emphysema ([place])   Type 2 diabetes mellitus with stage 3 chronic kidney disease, without long-term current use of insulin ([place])   Hypertension  Sepsis 2/2 UTI/PNA - patient resuscitated in ED with IVF bolus, triple abx. See below for plan  2, Dementia - per family progressive dementia with growing confusion and withdrawal  3. DM stable glucose  4. HTN - stable. See below for plan  5. End of Life care - prolonged family conference involving spouse and children. Discussed her current morbities and her poor prognosis. Her husband stated when discussing hospice care that she would not want heroic measures and that the quality of her life was poor.Given her poor overall prognosis and very low probability of meaningful on-gong quality of life her husband and children endorse comfort care only PLan D/c all medications except for comfort medication  Convert IVF to saline lock  MS 68m IV q 4 prn restlessness or distress  Ativan 0.5 mg q 4 agitation  Palliative care consult for transition to residential hospice should she survive the next Several days  Chaplin consult ordered.   DVT prophylaxis: None-comfort care  Code Status: DNR  Family Communication: met with husband and two children for prolonged family conference re: end of life care.   Disposition Plan: TBD  Consults called: none  Admission status: inpatient   MAdella HareMD [facility] Pager [phone] If 7PM-7AM, please contact night-coverage [email or web address] Password [facility] 04/22/2021, 10:32 AM

## 2021-04-22 NOTE — ED Triage Notes (Signed)
Pt to ED via [facility] with c/o fever 103.9 axillary, cbg 233, 113/83 after fluids given en route. Per EMS pt in A-fib at this time. Per EMS pt from [facility] with c/o fever and emesis, EMS reports pt's daughter reports some coffee ground appearance to emesis. Pt with baseline dementia, seen and dx with UTI.

## 2021-04-22 NOTE — Progress Notes (Signed)
CODE SEPSIS - PHARMACY COMMUNICATION  **Broad Spectrum Antibiotics should be administered within 1 hour of Sepsis diagnosis**  Time Code Sepsis Called/Page Received: 10/2 @ 0357  Antibiotics Ordered: metronidazole, cefepime , vancomycin  Time of 1st antibiotic administration: metronidazole 500 mg IV X 1 10/2 @ 0503   Additional action taken by pharmacy:   If necessary, Name of Provider/Nurse Contacted:     Ornella Coderre D ,PharmD Clinical Pharmacist  04/22/2021  5:27 AM

## 2021-04-22 NOTE — ED Notes (Signed)
RN to bedside to introduce self to patient. Pt sleeping. Family at bedside.

## 2021-04-22 NOTE — Progress Notes (Signed)
PHARMACY -  BRIEF ANTIBIOTIC NOTE   Pharmacy has received consult(s) for Vancomycin , Cefepime  from an ED provider.  The patient's profile has been reviewed for ht/wt/allergies/indication/available labs.    One time order(s) placed for Vancomycin 1250 mg IV X 1 and cefepime 2 gm IV X 1   Further antibiotics/pharmacy consults should be ordered by admitting physician if indicated.                       Thank you, Manda Holstad D 04/22/2021  4:12 AM

## 2021-04-22 NOTE — ED Provider Notes (Signed)
[facility] Emergency Department Provider Note  Time seen: 4:11 AM  I have reviewed the triage vital signs and the nursing notes.   HISTORY  Chief Complaint Fever and Emesis   HPI Tiffany Grimes is a 87 y.o. female with a past medical history of advanced dementia, hypertension, diabetes, presents to the emergency department for fever nausea and vomiting.  According to the daughter and record review patient was seen in the emergency department yesterday was diagnosed with urinary tract infection.  Antibiotics were called in but they had not yet picked the antibiotics up.  This evening while at her nursing facility vomited and was found to be febrile.   Past Medical History:  Diagnosis Date   Atrial fibrillation [facility])    Atrial fibrillation (HCC)    Barrett esophagus    due for EGD July 2012, [place]   Dysrhythmia    Hx of thyroid nodule July 2012   benign biopsy    Hypertension    Hypothyroidism    Lentigo    Reflux    Thyroid disease    TIA (transient ischemic attack) June 2012   TIA (transient ischemic attack) June 2012   Wears dentures    partial upper   Wears hearing aid     Patient Active Problem List   Diagnosis Date Noted   Coronary artery disease 07/21/2018   History of TIA (transient ischemic attack) 07/21/2018   Pulmonary hypertension (HCC) 07/21/2018   Type 2 diabetes mellitus with stage 3 chronic kidney disease, without long-term current use of insulin (HCC) 05/06/2018   Mild protein-calorie malnutrition (HCC) 02/10/2018   Carotid artery stenosis 02/28/2017   Alpha-1-antitrypsin deficiency (HCC) 10/19/2015   History of depression 10/19/2015   Underweight 07/30/2015   Furrowed tongue 07/30/2015   Benign essential HTN 05/11/2014   Severe mitral insufficiency 05/11/2014   Centrilobular emphysema (HCC) 04/18/2014   Alpha-1-antitrypsin deficiency carrier 04/18/2014   Unspecified hypothyroidism 04/07/2014   Multiple pulmonary nodules  10/05/2013   Pulmonary nodules/lesions, multiple 08/31/2013   PAD (peripheral artery disease) (HCC) 08/08/2013   External hemorrhoid 08/02/2013   Anal fissure 08/02/2013   Ecchymosis 05/10/2013   Anxiety state 10/28/2012   Routine general medical examination at a health care facility 06/15/2012   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   Osteoporosis, post-menopausal 02/11/2012   Lumbago with sciatica of right side 02/11/2012   Posterior neck pain 02/11/2012   Hyperlipidemia LDL goal <100 12/16/2011   Hx of thyroid nodule    Atrial fibrillation, new onset (HCC) 08/02/2011   Chest pain, atypical 08/02/2011   TIA (transient ischemic attack)    Barrett esophagus    Hypertension 03/29/2011    Past Surgical History:  Procedure Laterality Date   ABDOMINAL HYSTERECTOMY     CATARACT EXTRACTION W/PHACO Right 07/29/2016   Procedure: CATARACT EXTRACTION PHACO AND INTRAOCULAR LENS PLACEMENT (IOC);  Surgeon: Sherald Hess, MD;  Location: [facility];  Service: Ophthalmology;  Laterality: Right;  right   CATARACT EXTRACTION W/PHACO Left 07/02/2017   Procedure: CATARACT EXTRACTION PHACO AND INTRAOCULAR LENS PLACEMENT (IOC);  Surgeon: Nevada Crane, MD;  Location: [facility] ORS;  Service: Ophthalmology;  Laterality: Left;  [place] 00:50.9 AP% 14.8 CDE 7.54 Fluid Pack Lot # [id number]   CESAREAN SECTION     HERNIA REPAIR  2008   left inguinal   THYROID SURGERY  june 18   nodule removed    Prior to Admission medications   Medication Sig Start Date End Date Taking? Authorizing  Provider  amLODipine (NORVASC) 5 MG tablet TAKE 1 TABLET BY MOUTH DAILY. 06/11/16   Sherlene Shams, MD  atenolol (TENORMIN) 25 MG tablet TAKE 1 TABLET TWICE A DAY Patient not taking: No sig reported 12/21/15   Sherlene Shams, MD  atorvastatin (LIPITOR) 20 MG tablet TAKE 1 TABLET DAILY 07/14/15   Sherlene Shams, MD  azelastine (ASTELIN) 0.1 % nasal spray Place 1 spray into both nostrils 2 (two) times  daily. Use in each nostril as directed Patient not taking: No sig reported    [provider]  cephALEXin (KEFLEX) 500 MG capsule Take 1 capsule (500 mg total) by mouth 3 (three) times daily. 04/21/21   Sharman Cheek, MD  cloNIDine (CATAPRES) 0.1 MG tablet Take 0.1 mg by mouth 2 (two) times daily.  12/17/17   [provider]  esomeprazole (NEXIUM) 40 MG capsule TAKE 1 CAPSULE DAILY BEFORE BREAKFAST 04/01/16   Sherlene Shams, MD  furosemide (LASIX) 20 MG tablet Take 20 mg by mouth daily as needed for edema. 09/02/18 04/13/21  [provider]  hydrOXYzine (ATARAX/VISTARIL) 25 MG tablet Take 25 mg by mouth 3 (three) times daily as needed.    [provider]  latanoprost (XALATAN) 0.005 % ophthalmic solution Place 1 drop into both eyes at bedtime.     [provider]  levothyroxine (SYNTHROID) 100 MCG tablet Take 100 mcg by mouth daily before breakfast.    [provider]  lisinopril (PRINIVIL,ZESTRIL) 20 MG tablet Take 20 mg by mouth 2 (two) times daily.    [provider]  LORazepam (ATIVAN) 0.5 MG tablet Take 0.5 mg by mouth 3 (three) times daily as needed. 03/30/21   [provider]  polyethylene glycol (MIRALAX / GLYCOLAX) 17 g packet Take 17 g by mouth daily. Patient not taking: No sig reported 10/04/19   Emily Filbert, MD  QUEtiapine (SEROQUEL) 25 MG tablet Take 25 mg by mouth 2 (two) times daily.    [provider]  sucralfate (CARAFATE) 1 g tablet Take 1 tablet (1 g total) by mouth 2 (two) times daily. Patient not taking: No sig reported 08/28/15   Sherlene Shams, MD  XARELTO 15 MG TABS tablet TAKE 1 TABLET DAILY 11/06/15   Sherlene Shams, MD    Allergies  Allergen Reactions   Neomycin Itching    Family History  Problem Relation Age of Onset   Hypertension Mother    Stroke Mother    Heart disease Father 71       AMI   Hypertension Father    Vision loss Paternal Aunt    Cancer Neg Hx     Social  History Social History   Tobacco Use   Smoking status: Never   Smokeless tobacco: Never  Vaping Use   Vaping Use: Never used  Substance Use Topics   Alcohol use: No   Drug use: No    Review of Systems Unable to obtain adequate/accurate review of systems secondary to baseline dementia.  Per daughter patient was found to have a fever and vomited at her nursing facility tonight. ____________________________________________   PHYSICAL EXAM:  VITAL SIGNS: ED Triage Vitals  Enc Vitals Group     BP 04/22/21 0329 130/72     Pulse Rate 04/22/21 0329 96     Resp 04/22/21 0329 (!) 26     Temp 04/22/21 0329 (!) 101.8 F (38.8 C)     Temp Source 04/22/21 0329 Rectal     SpO2  04/22/21 0329 (!) 88 %     Weight 04/22/21 0316 119 lb (54 kg)     Height 04/22/21 0316 5\' 5"  (1.651 m)     Head Circumference --      Peak Flow --      Pain Score --      Pain Loc --      Pain Edu? --      Excl. in GC? --    Constitutional: Awake alert, no distress.  Currently calm and cooperative. Eyes: Normal exam ENT      Head: Normocephalic and atraumatic.      Mouth/Throat: Somewhat dry appearing mucous membranes. Cardiovascular: Normal rate, regular rhythm.  Respiratory: Normal respiratory effort without tachypnea nor retractions. Breath sounds are clear without obvious wheeze rales or rhonchi Gastrointestinal: Soft and nontender. No distention.   Musculoskeletal: Good range of motion all extremities without any elicited pain. Neurologic:  Normal speech and language. No gross focal neurologic deficits  Skin:  Skin is warm, dry and intact.  Psychiatric: Mood and affect are normal. Speech and behavior are normal.   ____________________________________________   RADIOLOGY  Extensive airspace disease on the right with history implicating  pneumonia.   ____________________________________________   INITIAL IMPRESSION / ASSESSMENT AND PLAN / ED COURSE  Pertinent labs & imaging results that  were available during my care of the patient were reviewed by me and considered in my medical decision making (see chart for details).   Patient presents to the emergency department from her nursing facility for fever and vomiting.  Patient was seen in the emergency department yesterday 04/21/2021 and diagnosed with urinary tract infection.  Per record review patient had nitrite positive urine with many bacteria.  Was unable to fill the prescription yesterday so has not yet started antibiotics.  Patient currently is awake alert, no distress but is febrile to 101.8 slightly tachycardic at 96 bpm.  Mildly tachypneic.  Patient meets sepsis criteria.  We will send blood cultures, recheck labs.  We will start IV antibiotics.  Patient will require admission to the hospital service once her emergency department work-up is been completed.  X-ray consistent with pneumonia as well.  Patient receiving IV antibiotics.   Tiffany Grimes was evaluated in Emergency Department on 04/22/2021 for the symptoms described in the history of present illness. She was evaluated in the context of the global COVID-19 pandemic, which necessitated consideration that the patient might be at risk for infection with the SARS-CoV-2 virus that causes COVID-19. Institutional protocols and algorithms that pertain to the evaluation of patients at risk for COVID-19 are in a state of rapid change based on information released by regulatory bodies including the CDC and federal and state organizations. These policies and algorithms were followed during the patient's care in the ED.  CRITICAL CARE Performed by: 06/22/2021   Total critical care time: 30 minutes  Critical care time was exclusive of separately billable procedures and treating other patients.  Critical care was necessary to treat or prevent imminent or life-threatening deterioration.  Critical care was time spent personally by me on the following activities: development of  treatment plan with patient and/or surrogate as well as nursing, discussions with consultants, evaluation of patient's response to treatment, examination of patient, obtaining history from patient or surrogate, ordering and performing treatments and interventions, ordering and review of laboratory studies, ordering and review of radiographic studies, pulse oximetry and re-evaluation of patient's condition.  ____________________________________________   FINAL CLINICAL IMPRESSION(S) / ED DIAGNOSES  Sepsis UTI Pneumonia   Minna Antis, MD 04/22/21 [phone]

## 2021-04-22 NOTE — Sepsis Progress Note (Signed)
Elink following for Sepsis Protocol 

## 2021-04-22 NOTE — ED Notes (Signed)
MD at the bedside  

## 2021-04-22 NOTE — TOC CM/SW Note (Addendum)
Patient is high risk for readmission. Disoriented x 4. Left VM for daughter/guardian Darl Pikes requesting a return call for TOC assessment. Per chart review, patient resides at [facility]  2:20- Spoke to daughter. Patient is now comfort care. Daughter has medical questions. Asked RN to call daughter per her request. Encouraged daughter to reach out with any needs or questions.  Alfonso Ramus, [place] [phone]

## 2021-04-22 NOTE — Plan of Care (Signed)
Patient admitted today from the ED.  Disoriented x4.  Easily aroused. Minimal speech.  Comfort measures initiated. Slept throughout the shift.  Resting comfortably.  No signs of pain nor dyspnea.  Enteric precautions d/c. Patient had a formed stool.

## 2021-04-22 NOTE — Progress Notes (Signed)
Met with family as requested by staff. Provided presence and support. Informed husband, son and daughter a chaplain is always available for support if and when needed. No other needs expressed at the time.

## 2021-04-22 NOTE — ED Notes (Signed)
Critical Lactic--2.7, MD notified and aware

## 2021-04-23 ENCOUNTER — Encounter: Payer: Self-pay | Admitting: Internal Medicine

## 2021-04-23 DIAGNOSIS — N390 Urinary tract infection, site not specified: Secondary | ICD-10-CM | POA: Diagnosis not present

## 2021-04-23 DIAGNOSIS — Z66 Do not resuscitate: Secondary | ICD-10-CM | POA: Diagnosis not present

## 2021-04-23 DIAGNOSIS — J189 Pneumonia, unspecified organism: Secondary | ICD-10-CM | POA: Diagnosis not present

## 2021-04-23 DIAGNOSIS — F03918 Unspecified dementia, unspecified severity, with other behavioral disturbance: Secondary | ICD-10-CM

## 2021-04-23 DIAGNOSIS — A419 Sepsis, unspecified organism: Secondary | ICD-10-CM | POA: Diagnosis not present

## 2021-04-23 DIAGNOSIS — Z515 Encounter for palliative care: Secondary | ICD-10-CM | POA: Diagnosis not present

## 2021-04-23 LAB — BLOOD CULTURE ID PANEL (REFLEXED) - BCID2

## 2021-04-23 LAB — HEMOGLOBIN A1C
Hgb A1c MFr Bld: 6.6 % — ABNORMAL HIGH (ref 4.8–5.6)
Mean Plasma Glucose: 143 mg/dL

## 2021-04-23 NOTE — TOC Initial Note (Signed)
Transition of Care [facility]) - Initial/Assessment Note    Patient Details  Name: Tiffany Grimes MRN: [id number] Date of Birth: Sep 16, 1928  Transition [facility]) CM/SW Contact:    Allayne Butcher, RN Phone Number: 04/23/2021, 3:18 PM  Clinical Narrative:                 Patient has been made comfort care.  RNCM spoke with patient's daughter's and legal guardian, Darl Pikes.  Darl Pikes reports that patient was living at [facility] but had only been there since Sept 8th, patient had been declining at home and then when she got to the ALF she continued to decline, having multiple falls and multiple trips to the ER.  TOC Consult for residential hospice.  Darl Pikes would like the [facility] in [place].  Referral given to [facility] with [facility].    Expected Discharge Plan: Hospice Medical Facility Barriers to Discharge: Hospice Bed not available   Patient Goals and CMS Choice Patient states their goals for this hospitalization and ongoing recovery are:: Patient's daughter is interested in residential hospice here in [place].gov Compare Post Acute Care list provided to:: Legal Guardian Choice offered to / list presented to : Adult Children, HC POA / Guardian  Expected Discharge Plan and Services Expected Discharge Plan: Hospice Medical Facility   Discharge Planning Services: CM Consult Post Acute Care Choice: Hospice Living arrangements for the past 2 months: Single Family Home, Assisted Living Facility                 DME Arranged: N/A DME Agency: NA       HH Arranged: NA HH Agency: NA        Prior Living Arrangements/Services Living arrangements for the past 2 months: Single Family Home, Assisted Living Facility Lives with:: Facility Resident, Spouse Patient language and need for interpreter reviewed:: Yes Do you feel safe going back to the place where you live?: No   not able to go back to [facility]- patient deteriorated significantly since beginning of  Sept  Need for Family Participation in Patient Care: Yes (Comment) Care giver support system in place?: Yes (comment) (daughter, son, husband) Current home services: DME (walker at facility) Criminal Activity/Legal Involvement Pertinent to Current Situation/Hospitalization: No - Comment as needed  Activities of Daily Living   ADL Screening (condition at time of admission) Patient's cognitive ability adequate to safely complete daily activities?: No Does the patient have difficulty concentrating, remembering, or making decisions?: Yes Patient able to express need for assistance with ADLs?: No Does the patient have difficulty dressing or bathing?: Yes Independently performs ADLs?: No Communication: Independent Dressing (OT): Dependent Grooming: Dependent Bathing: Dependent Toileting: Dependent In/Out Bed: Needs assistance (Patient on comfort measures.)  Permission Sought/Granted Permission sought to share information with : Case Manager, Magazine features editor, Family Supports Permission granted to share information with : Yes, Verbal Permission Granted  Share Information with NAME: Mikalyn Hermida  Permission granted to share info w AGENCY: [facility]  Permission granted to share info w Relationship: daughter  Permission granted to share info w Contact Information: [phone]  Emotional Assessment Appearance:: Appears stated age Attitude/Demeanor/Rapport: Unable to Assess Affect (typically observed): Unable to Assess Orientation: : Oriented to Self Alcohol / Substance Use: Not Applicable Psych Involvement: No (comment)  Admission diagnosis:  Pain [R52] Sepsis secondary to UTI (HCC) [A41.9, N39.0] Patient Active Problem List   Diagnosis Date Noted   Sepsis secondary to UTI (HCC) 04/22/2021  CAP (community acquired pneumonia) 04/22/2021   End of life care 04/22/2021   Coronary artery disease 07/21/2018   History of TIA (transient ischemic attack) 07/21/2018    Pulmonary hypertension (HCC) 07/21/2018   Type 2 diabetes mellitus with stage 3 chronic kidney disease, without long-term current use of insulin (HCC) 05/06/2018   Mild protein-calorie malnutrition (HCC) 02/10/2018   Carotid artery stenosis 02/28/2017   Alpha-1-antitrypsin deficiency (HCC) 10/19/2015   History of depression 10/19/2015   Underweight 07/30/2015   Furrowed tongue 07/30/2015   Benign essential HTN 05/11/2014   Severe mitral insufficiency 05/11/2014   Centrilobular emphysema (HCC) 04/18/2014   Alpha-1-antitrypsin deficiency carrier 04/18/2014   Unspecified hypothyroidism 04/07/2014   Multiple pulmonary nodules 10/05/2013   Pulmonary nodules/lesions, multiple 08/31/2013   PAD (peripheral artery disease) (HCC) 08/08/2013   External hemorrhoid 08/02/2013   Anal fissure 08/02/2013   Ecchymosis 05/10/2013   Anxiety state 10/28/2012   Routine general medical examination at a health care facility 06/15/2012   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 [place] [address] Phone: [phone] Fax: [phone]  [facility] - [place], [place] - [address] [place] [address] Phone: [phone] Fax: [phone]  [facility] - [place], [place] - [address] [place] [address] Phone: [phone] Fax: [phone]     Social Determinants of Health (SDOH) Interventions    Readmission Risk  Interventions No flowsheet data found.

## 2021-04-23 NOTE — Progress Notes (Addendum)
[facility]) Hospital Liaison Note   Received request from Transitions of Care Manager Robbie Lis, RN for family interest in Hospice Home. Visited patient at bedside and spoke with daughter, Darl Pikes to confirm interest and explain services. Patient chart and information reviewed by [facility] physician. Hospice Home eligibility confirmed.    Unfortunately, Hospice Home is not able to offer a room today. Family and [facility] Manager aware hospital liaison will follow up tomorrow or sooner if a room becomes available.    Please do not hesitate to call with any hospice related questions.    Thank you for the opportunity to participate in this patient's care.   Bobbie "Einar Gip, RN, BSN [facility] Liaison [phone]

## 2021-04-23 NOTE — Consult Note (Signed)
Consultation Note Date: 04/23/2021   Patient Name: Tiffany Grimes  DOB: 08-06-1928  MRN: [id number]  Age / Sex: 87 y.o., female  PCP: Lauro Regulus, MD Referring Physician: Lurene Shadow, MD  Reason for Consultation: Establishing goals of care and Psychosocial/spiritual support  HPI/Patient Profile: 87 y.o. female   admitted on 04/22/2021 with past medical history significant of advanced dementia, hypertension, diabetes, presents to the emergency department for fever nausea and vomiting.  Patient has had continued physical, functional and cognitive decline over the past many months.  She has had multiple falls over the past 10 days with several ED evaluations.  She lives at [facility] care [facility].   According to the daughter and record review patient was seen in the emergency department yesterday was diagnosed with urinary tract infection.  Antibiotics were called in but they had not yet picked the antibiotics up.  This evening while at her nursing facility vomited and was found to be febrile.    ED Course: Tmax 101.8  103/57  HR 94  RR 16. EDP exam - no focal findings. Lab: glucose 155, Lactic acid 2.7 - 1.7  WBC 8.8 w/ 95/2/3. U/A w/ 21-50 WBC/hpf, many bacteria. CXR extensive air space disease right lung c/w PNA. In ED code sepsis called: 1.5 L LR given, abx - Vanc, Flagyl, Rocephin.   After meeting with admitting physician, family has made decision to shift to a full comfort path.  Patient opens eyes to her name, is lethargic.  Does not follow commands. Has only hd a few sips over the past 36 hours.      Clinical Assessment and Goals of Care:  This NP Lorinda Creed reviewed medical records, received report from team, assessed the patient and then meet at the patient's bedside  to discuss diagnosis, prognosis, GOC, EOL wishes disposition and options.   Concept of Palliative Care was introduced as  specialized medical care for people and their families living with serious illness.  If focuses on providing relief from the symptoms and stress of a serious illness.  The goal is to improve quality of life for both the patient and the family.  Created space and opportunity for patient  and family to explore thoughts and feelings regarding current medical situation     A  discussion was had today regarding advanced directives.  Concepts specific to code status, artifical feeding and hydration, continued IV antibiotics and rehospitalization was had.  The difference between a aggressive medical intervention path  and a palliative comfort care path for this patient at this time was had.  Values and goals of care important to patient and family were attempted to be elicited.     Natural trajectory and expectations at EOL were discussed.  Questions and concerns addressed.  Patient  encouraged to call with questions or concerns.     PMT will continue to support holistically.      Daughter/ Darl Pikes Gravett/HPOA- documented in [facility]    SUMMARY OF RECOMMENDATIONS    -focus of  care is comfort and dignity, allow for a natural death  -no artifical feeding or hydration now or in the future, sips/chips as tolerated -no further diagnostics or life prolonging measures -minimize medications to those enhancing comfort -evaluate in 24-48 hours for TOC  Code Status/Advance Care Planning: DNR   Symptom Management:  Dyspnea/Pain:  Morphine 4 mg IV every 4 hrs prn  Anxiety/Agitation: Ativan 0.5 mg IV every 4 hours prn   Palliative Prophylaxis:  Bowel Regimen, Delirium Protocol, Eye Care, Frequent Pain Assessment, and Oral Care  Additional Recommendations (Limitations, Scope, Preferences): Full Comfort Care  Psycho-social/Spiritual:  Desire for further Chaplaincy support:yes Additional Recommendations: Education on Hospice  Prognosis:  < 2 weeks  Discharge Planning: To Be Determined       Primary Diagnoses: Present on Admission:  Sepsis secondary to UTI (HCC)  Hypertension  Presenile dementia with paranoia  Centrilobular emphysema (HCC)  Type 2 diabetes mellitus with stage 3 chronic kidney disease, without long-term current use of insulin (HCC)  CAP (community acquired pneumonia)   I have reviewed the medical record, interviewed the patient and family, and examined the patient. The following aspects are pertinent.  Past Medical History:  Diagnosis Date   Atrial fibrillation [facility])    Atrial fibrillation (HCC)    Barrett esophagus    due for EGD July 2012, [place]   Dysrhythmia    Hx of thyroid nodule July 2012   benign biopsy    Hypertension    Hypothyroidism    Lentigo    Reflux    Thyroid disease    TIA (transient ischemic attack) June 2012   TIA (transient ischemic attack) June 2012   Wears dentures    partial upper   Wears hearing aid    Social History   Socioeconomic History   Marital status: Married    Spouse name: Not on file   Number of children: Not on file   Years of education: Not on file   Highest education level: Not on file  Occupational History   Not on file  Tobacco Use   Smoking status: Never   Smokeless tobacco: Never  Vaping Use   Vaping Use: Never used  Substance and Sexual Activity   Alcohol use: No   Drug use: No   Sexual activity: Not on file  Other Topics Concern   Not on file  Social History Narrative   Not on file   Social Determinants of Health   Financial Resource Strain: Not on file  Food Insecurity: Not on file  Transportation Needs: Not on file  Physical Activity: Not on file  Stress: Not on file  Social Connections: Not on file   Family History  Problem Relation Age of Onset   Hypertension Mother    Stroke Mother    Heart disease Father 45       AMI   Hypertension Father    Vision loss Paternal Aunt    Cancer Neg Hx    Scheduled Meds: Continuous Infusions: PRN Meds:.acetaminophen **OR**  acetaminophen, LORazepam, morphine injection, ondansetron **OR** ondansetron (ZOFRAN) IV Medications Prior to Admission:  Prior to Admission medications   Medication Sig Start Date End Date Taking? Authorizing Provider  amLODipine (NORVASC) 5 MG tablet TAKE 1 TABLET BY MOUTH DAILY. 06/11/16   Sherlene Shams, MD  atenolol (TENORMIN) 25 MG tablet TAKE 1 TABLET TWICE A DAY Patient not taking: No sig reported 12/21/15   Sherlene Shams, MD  atorvastatin (LIPITOR) 20 MG tablet TAKE 1 TABLET  DAILY 07/14/15   Sherlene Shams, MD  azelastine (ASTELIN) 0.1 % nasal spray Place 1 spray into both nostrils 2 (two) times daily. Use in each nostril as directed Patient not taking: No sig reported    [provider]  cephALEXin (KEFLEX) 500 MG capsule Take 1 capsule (500 mg total) by mouth 3 (three) times daily. 04/21/21   Sharman Cheek, MD  cloNIDine (CATAPRES) 0.1 MG tablet Take 0.1 mg by mouth 2 (two) times daily.  12/17/17   [provider]  esomeprazole (NEXIUM) 40 MG capsule TAKE 1 CAPSULE DAILY BEFORE BREAKFAST 04/01/16   Sherlene Shams, MD  furosemide (LASIX) 20 MG tablet Take 20 mg by mouth daily as needed for edema. 09/02/18 04/13/21  [provider]  hydrOXYzine (ATARAX/VISTARIL) 25 MG tablet Take 25 mg by mouth 3 (three) times daily as needed.    [provider]  latanoprost (XALATAN) 0.005 % ophthalmic solution Place 1 drop into both eyes at bedtime.     [provider]  levothyroxine (SYNTHROID) 100 MCG tablet Take 100 mcg by mouth daily before breakfast.    [provider]  lisinopril (PRINIVIL,ZESTRIL) 20 MG tablet Take 20 mg by mouth 2 (two) times daily.    [provider]  LORazepam (ATIVAN) 0.5 MG tablet Take 0.5 mg by mouth 3 (three) times daily as needed. 03/30/21   [provider]  polyethylene glycol (MIRALAX / GLYCOLAX) 17 g packet Take 17 g by mouth daily. Patient not taking: No sig reported 10/04/19   Emily Filbert, MD  QUEtiapine (SEROQUEL) 25 MG tablet Take 25 mg by mouth 2 (two) times daily.    [provider]  sucralfate (CARAFATE) 1 g tablet Take 1 tablet (1 g total) by mouth 2 (two) times daily. Patient not taking: No sig reported 08/28/15   Sherlene Shams, MD  XARELTO 15 MG TABS tablet TAKE 1 TABLET DAILY 11/06/15   Sherlene Shams, MD   Allergies  Allergen Reactions   Neomycin Itching   Review of Systems  Unable to perform ROS: Acuity of condition   Physical Exam Constitutional:      Appearance: She is cachectic. She is ill-appearing.     Interventions: Nasal cannula in place.  Cardiovascular:     Rate and Rhythm: Normal rate.  Pulmonary:     Breath sounds: Normal breath sounds.  Skin:    General: Skin is warm and dry.  Neurological:     Mental Status: She is lethargic.    Vital Signs: BP 130/85 (BP Location: Left Arm)   Pulse 100   Temp 97.8 F (36.6 C)   Resp 17   Ht 5\' 5"  (1.651 m)   Wt 54 kg   SpO2 95%   BMI 19.80 kg/m  Pain Scale: 0-10   Pain Score: 0-No pain   SpO2: SpO2: 95 % O2 Device:SpO2: 95 % O2 Flow Rate: .O2 Flow Rate (L/min): 5 L/min  IO: Intake/output summary: No intake or output data in the 24 hours ending 04/23/21 1159  LBM: Last BM Date: 04/22/21 Baseline Weight: Weight: 54 kg Most recent weight: Weight: 54 kg     Palliative Assessment/Data: 20 %    Discussed with bedside RN/Chris  Time In: 1100 Time Out: 1215 Time Total: 75 minutes Greater than 50%  of this time was spent counseling and coordinating care related to the above assessment and plan.  Signed by: 1216, NP   Please contact Palliative Medicine Team phone at  [phone] for questions and concerns.  For individual provider: See Shea Evans

## 2021-04-23 NOTE — Progress Notes (Addendum)
Progress Note    Tiffany Grimes  URK:[id number] DOB: 10-19-1928  DOA: 04/22/2021 PCP: Lauro Regulus, MD      Brief Narrative:    Medical records reviewed and are as summarized below:  Tiffany Grimes is a 87 y.o. female with medical history significant for dementia, type II DM, hypertension, COPD, who was brought from the long-term care facility to the emergency room because of fever, nausea and vomiting.  She was found to have atrial fibrillation with RVR, sepsis secondary to acute UTI and community-acquired pneumonia.  Initially, she was treated with empiric IV antibiotics.  Her family opted for comfort measures with hospice.      Assessment/Plan:   Active Problems:   Hypertension   Presenile dementia with paranoia   Centrilobular emphysema (HCC)   Type 2 diabetes mellitus with stage 3 chronic kidney disease, without long-term current use of insulin (HCC)   Sepsis secondary to UTI (HCC)   CAP (community acquired pneumonia)   End of life care    Body mass index is 19.8 kg/m.   Sepsis secondary to acute UTI and community-acquired pneumonia Atrial fibrillation with RVR Progressive dementia COPD/emphysema Type 2 diabetes mellitus Hypertension  PLAN  Patient has been transitioned to comfort care. Consulted hospice to assist with disposition to hospice home. Continue Ativan as needed for anxiety and IV morphine as needed for pain.   Diet Order             Diet Heart Room service appropriate? Yes; Fluid consistency: Thin  Diet effective now                      Consultants: Palliative care, hospice  Procedures: None    Medications:    Continuous Infusions:   Anti-infectives (From admission, onward)    Start     Dose/Rate Route Frequency Ordered Stop   04/22/21 1300  cefTRIAXone (ROCEPHIN) 2 g in sodium chloride 0.9 % 100 mL IVPB  Status:  Discontinued        2 g 200 mL/hr over 30 Minutes Intravenous Every 24 hours 04/22/21 0622  04/22/21 1025   04/22/21 0800  azithromycin (ZITHROMAX) 500 mg in sodium chloride 0.9 % 250 mL IVPB  Status:  Discontinued        500 mg 250 mL/hr over 60 Minutes Intravenous Every 24 hours 04/22/21 0726 04/22/21 1025   04/22/21 0415  vancomycin (VANCOREADY) IVPB 1250 mg/250 mL        1,250 mg 166.7 mL/hr over 90 Minutes Intravenous  Once 04/22/21 0412 04/22/21 0743   04/22/21 0400  ceFEPIme (MAXIPIME) 2 g in sodium chloride 0.9 % 100 mL IVPB        2 g 200 mL/hr over 30 Minutes Intravenous  Once 04/22/21 0357 04/22/21 0540   04/22/21 0400  metroNIDAZOLE (FLAGYL) IVPB 500 mg        500 mg 100 mL/hr over 60 Minutes Intravenous  Once 04/22/21 0357 04/22/21 0605   04/22/21 0400  vancomycin (VANCOCIN) IVPB 1000 mg/200 mL premix  Status:  Discontinued        1,000 mg 200 mL/hr over 60 Minutes Intravenous  Once 04/22/21 0357 04/22/21 0411              Family Communication/Anticipated D/C date and plan/Code Status   DVT prophylaxis:      Code Status: DNR  Family Communication: None Disposition Plan:    Status is: Inpatient  Remains inpatient appropriate because:Inpatient level  of care appropriate due to severity of illness  Dispo: The patient is from: Home              Anticipated d/c is to:  Hospice home              Patient currently is not medically stable to d/c.   Difficult to place patient No           Subjective:   Interval events noted.  She is confused and unable to provide any history.  Objective:    Vitals:   04/22/21 1549 04/23/21 0635 04/23/21 0727 04/23/21 1135  BP: 134/84 131/82 (!) 148/81 130/85  Pulse: 97 (!) 104 (!) 103 100  Resp: 16  17 17   Temp: 98 F (36.7 C) 98.1 F (36.7 C) (!) 97.4 F (36.3 C) 97.8 F (36.6 C)  TempSrc:   Oral   SpO2: 90% 94% 98% 95%  Weight:      Height:       No data found.  No intake or output data in the 24 hours ending 04/23/21 1743 Filed Weights   04/22/21 0316  Weight: 54 kg    Exam:  GEN:  NAD SKIN: No rash EYES: EOMI ENT: MMM CV: RRR PULM: CTA B ABD: soft, ND, NT, +BS CNS: AAO x 3, non focal EXT: No edema or tenderness        Data Reviewed:   I have personally reviewed following labs and imaging studies:  Labs: Labs show the following:   Basic Metabolic Panel: Recent Labs  Lab 04/19/21 2051 04/21/21 1138 04/22/21 0331  NA 137 136 135  K 4.5 4.6 4.5  CL 98 97* 101  CO2 29 30 26   GLUCOSE 147* 135* 155*  BUN 26* 20 22  CREATININE 0.81 0.90 0.90  CALCIUM 8.9 9.3 8.2*   GFR Estimated Creatinine Clearance: 35.4 mL/min (by C-G formula based on SCr of 0.9 mg/dL). Liver Function Tests: Recent Labs  Lab 04/22/21 0331  AST 26  ALT 17  ALKPHOS 92  BILITOT 1.4*  PROT 5.3*  ALBUMIN 3.0*   No results for input(s): LIPASE, AMYLASE in the last 168 hours. No results for input(s): AMMONIA in the last 168 hours. Coagulation profile Recent Labs  Lab 04/22/21 0331  INR 2.0*    CBC: Recent Labs  Lab 04/19/21 2051 04/21/21 1138 04/22/21 0331  WBC 7.6 7.7 8.8  NEUTROABS  --   --  8.3*  HGB 12.9 13.5 11.8*  HCT 38.5 41.5 34.6*  MCV 90.0 89.6 89.4  PLT 253 291 260   Cardiac Enzymes: No results for input(s): CKTOTAL, CKMB, CKMBINDEX, TROPONINI in the last 168 hours. BNP (last 3 results) No results for input(s): PROBNP in the last 8760 hours. CBG: Recent Labs  Lab 04/22/21 0844 04/22/21 1152 04/22/21 1654  GLUCAP 162* 167* 187*   D-Dimer: No results for input(s): DDIMER in the last 72 hours. Hgb A1c: Recent Labs    04/22/21 0331  HGBA1C 6.6*   Lipid Profile: No results for input(s): CHOL, HDL, LDLCALC, TRIG, CHOLHDL, LDLDIRECT in the last 72 hours. Thyroid function studies: No results for input(s): TSH, T4TOTAL, T3FREE, THYROIDAB in the last 72 hours.  Invalid input(s): FREET3 Anemia work up: No results for input(s): VITAMINB12, FOLATE, FERRITIN, TIBC, IRON, RETICCTPCT in the last 72 hours. Sepsis Labs: Recent Labs  Lab  04/19/21 2051 04/21/21 1138 04/22/21 0331 04/22/21 0448  WBC 7.6 7.7 8.8  --   LATICACIDVEN  --   --  2.7* 1.7    Microbiology Recent Results (from the past 240 hour(s))  Urine Culture     Status: Abnormal (Preliminary result)   Collection Time: 04/21/21  3:38 PM   Specimen: Urine, Clean Catch  Result Value Ref Range Status   Specimen Description   Final    URINE, CLEAN CATCH Performed at [facility], [address] [place], [place] [address]    Special Requests   Final    NONE Performed at [facility], [address], [place], [place] [address]    Culture (A)  Final    >=100,000 COLONIES/mL PROTEUS MIRABILIS SUSCEPTIBILITIES TO FOLLOW Performed at [facility], [address] [place], [place] [address]    Report Status PENDING  Incomplete  Culture, blood (Routine x 2)     Status: None (Preliminary result)   Collection Time: 04/22/21  3:31 AM   Specimen: BLOOD  Result Value Ref Range Status   Specimen Description BLOOD LEFT ARM  Final   Special Requests   Final    BOTTLES DRAWN AEROBIC AND ANAEROBIC Blood Culture adequate volume   Culture   Final    NO GROWTH 1 DAY Performed at [facility], [address] [place], [place] [address]    Report Status PENDING  Incomplete  Resp Panel by RT-PCR (Flu A&B, Covid) Nasopharyngeal Swab     Status: None   Collection Time: 04/22/21  3:31 AM   Specimen: Nasopharyngeal Swab; Nasopharyngeal(NP) swabs in vial transport medium  Result Value Ref Range Status   SARS Coronavirus 2 by RT PCR NEGATIVE NEGATIVE Final    Comment: (NOTE) SARS-CoV-2 target nucleic acids are NOT DETECTED.  The SARS-CoV-2 RNA is generally detectable in upper respiratory specimens during the acute phase of infection. The lowest concentration of SARS-CoV-2 viral copies this assay can detect is 138 copies/mL. A negative result does not preclude SARS-Cov-2 infection and should not be used as the sole basis for treatment  or other patient management decisions. A negative result may occur with  improper specimen collection/handling, submission of specimen other than nasopharyngeal swab, presence of viral mutation(s) within the areas targeted by this assay, and inadequate number of viral copies(<138 copies/mL). A negative result must be combined with clinical observations, patient history, and epidemiological information. The expected result is Negative.  Fact Sheet for Patients:  BloggerCourse.com  Fact Sheet for Healthcare Providers:  SeriousBroker.it  This test is no t yet approved or cleared by the [place] FDA and  has been authorized for detection and/or diagnosis of SARS-CoV-2 by FDA under an Emergency Use Authorization (EUA). This EUA will remain  in effect (meaning this test can be used) for the duration of the COVID-19 declaration under Section 564(b)(1) of the Act, 21 U.S.C.section 360bbb-3(b)(1), unless the authorization is terminated  or revoked sooner.       Influenza A by PCR NEGATIVE NEGATIVE Final   Influenza B by PCR NEGATIVE NEGATIVE Final    Comment: (NOTE) The Xpert Xpress SARS-CoV-2/FLU/RSV plus assay is intended as an aid in the diagnosis of influenza from Nasopharyngeal swab specimens and should not be used as a sole basis for treatment. Nasal washings and aspirates are unacceptable for Xpert Xpress SARS-CoV-2/FLU/RSV testing.  Fact Sheet for Patients: BloggerCourse.com  Fact Sheet for Healthcare Providers: SeriousBroker.it  This test is not yet approved or cleared by the [place] FDA and has been authorized for detection and/or diagnosis of SARS-CoV-2 by FDA under an Emergency Use Authorization (EUA). This  EUA will remain in effect (meaning this test can be used) for the duration of the COVID-19 declaration under Section 564(b)(1) of the Act, 21 U.S.C. section  360bbb-3(b)(1), unless the authorization is terminated or revoked.  Performed at [facility], [address] [place], [place] [address]   Culture, blood (Routine x 2)     Status: None (Preliminary result)   Collection Time: 04/22/21  4:49 AM   Specimen: BLOOD  Result Value Ref Range Status   Specimen Description BLOOD RIGHT FOREARM  Final   Special Requests   Final    Blood Culture results may not be optimal due to an inadequate volume of blood received in culture bottles   Culture  Setup Time   Final    Organism ID to follow GRAM POSITIVE COCCI AEROBIC BOTTLE ONLY CRITICAL RESULT CALLED TO, READ BACK BY AND VERIFIED WITH: C/KRISTIN MERRIS 04/23/21 1238 AMK Performed at [facility], [address] [place], [place] [address]    Culture GRAM POSITIVE COCCI  Final   Report Status PENDING  Incomplete  Blood Culture ID Panel (Reflexed)     Status: None   Collection Time: 04/22/21  4:49 AM  Result Value Ref Range Status   Enterococcus faecalis NOT DETECTED NOT DETECTED Final   Enterococcus Faecium NOT DETECTED NOT DETECTED Final   Listeria monocytogenes NOT DETECTED NOT DETECTED Final   Staphylococcus species NOT DETECTED NOT DETECTED Final   Staphylococcus aureus (BCID) NOT DETECTED NOT DETECTED Final   Staphylococcus epidermidis NOT DETECTED NOT DETECTED Final   Staphylococcus lugdunensis NOT DETECTED NOT DETECTED Final   Streptococcus species NOT DETECTED NOT DETECTED Final   Streptococcus agalactiae NOT DETECTED NOT DETECTED Final   Streptococcus pneumoniae NOT DETECTED NOT DETECTED Final   Streptococcus pyogenes NOT DETECTED NOT DETECTED Final   A.calcoaceticus-baumannii NOT DETECTED NOT DETECTED Final   Bacteroides fragilis NOT DETECTED NOT DETECTED Final   Enterobacterales NOT DETECTED NOT DETECTED Final   Enterobacter cloacae complex NOT DETECTED NOT DETECTED Final   Escherichia coli NOT DETECTED NOT DETECTED Final   Klebsiella aerogenes NOT DETECTED  NOT DETECTED Final   Klebsiella oxytoca NOT DETECTED NOT DETECTED Final   Klebsiella pneumoniae NOT DETECTED NOT DETECTED Final   Proteus species NOT DETECTED NOT DETECTED Final   Salmonella species NOT DETECTED NOT DETECTED Final   Serratia marcescens NOT DETECTED NOT DETECTED Final   Haemophilus influenzae NOT DETECTED NOT DETECTED Final   Neisseria meningitidis NOT DETECTED NOT DETECTED Final   Pseudomonas aeruginosa NOT DETECTED NOT DETECTED Final   Stenotrophomonas maltophilia NOT DETECTED NOT DETECTED Final   Candida albicans NOT DETECTED NOT DETECTED Final   Candida auris NOT DETECTED NOT DETECTED Final   Candida glabrata NOT DETECTED NOT DETECTED Final   Candida krusei NOT DETECTED NOT DETECTED Final   Candida parapsilosis NOT DETECTED NOT DETECTED Final   Candida tropicalis NOT DETECTED NOT DETECTED Final   Cryptococcus neoformans/gattii NOT DETECTED NOT DETECTED Final    Comment: Performed at [facility], [address] [place], [place] [address]    Procedures and diagnostic studies:  DG Chest Port 1 View  Result Date: 04/22/2021 CLINICAL DATA:  Fever EXAM: PORTABLE CHEST 1 VIEW COMPARISON:  12/25/2018 FINDINGS: Mild cardiomegaly. Extensive airspace opacity asymmetric to the right lung asymmetry and fever history suggesting pneumonia. Trace right pleural effusion is likely. No pneumothorax. IMPRESSION: Extensive airspace disease on the right with history implicating pneumonia. Electronically Signed   By: Audry Riles.D.  On: 04/22/2021 04:10               LOS: 1 day   Ninette Cotta  [facility]   Pager on [email or web address]. If 7PM-7AM, please contact night-coverage at [email or web address]     04/23/2021, 5:43 PM

## 2021-04-23 NOTE — Progress Notes (Signed)
PHARMACY - PHYSICIAN COMMUNICATION CRITICAL VALUE ALERT - BLOOD CULTURE IDENTIFICATION (BCID)  Tiffany Grimes is an 87 y.o. female who presented to [facility] on 04/22/2021 with a chief complaint of fever, N/V.   Assessment:  blood culture with GPC in 1 of 4 bottles.  BCID did not detect anything. Patient is comfort care  Name of physician (or Provider) Contacted: Dr Myriam Forehand  Current antibiotics: none  Changes to prescribed antibiotics recommended:  Comfort care  Results for orders placed or performed during the hospital encounter of 04/22/21  Blood Culture ID Panel (Reflexed) (Collected: 04/22/2021  4:49 AM)  Result Value Ref Range   Enterococcus faecalis NOT DETECTED NOT DETECTED   Enterococcus Faecium NOT DETECTED NOT DETECTED   Listeria monocytogenes NOT DETECTED NOT DETECTED   Staphylococcus species NOT DETECTED NOT DETECTED   Staphylococcus aureus (BCID) NOT DETECTED NOT DETECTED   Staphylococcus epidermidis NOT DETECTED NOT DETECTED   Staphylococcus lugdunensis NOT DETECTED NOT DETECTED   Streptococcus species NOT DETECTED NOT DETECTED   Streptococcus agalactiae NOT DETECTED NOT DETECTED   Streptococcus pneumoniae NOT DETECTED NOT DETECTED   Streptococcus pyogenes NOT DETECTED NOT DETECTED   A.calcoaceticus-baumannii NOT DETECTED NOT DETECTED   Bacteroides fragilis NOT DETECTED NOT DETECTED   Enterobacterales NOT DETECTED NOT DETECTED   Enterobacter cloacae complex NOT DETECTED NOT DETECTED   Escherichia coli NOT DETECTED NOT DETECTED   Klebsiella aerogenes NOT DETECTED NOT DETECTED   Klebsiella oxytoca NOT DETECTED NOT DETECTED   Klebsiella pneumoniae NOT DETECTED NOT DETECTED   Proteus species NOT DETECTED NOT DETECTED   Salmonella species NOT DETECTED NOT DETECTED   Serratia marcescens NOT DETECTED NOT DETECTED   Haemophilus influenzae NOT DETECTED NOT DETECTED   Neisseria meningitidis NOT DETECTED NOT DETECTED   Pseudomonas aeruginosa NOT DETECTED NOT DETECTED    Stenotrophomonas maltophilia NOT DETECTED NOT DETECTED   Candida albicans NOT DETECTED NOT DETECTED   Candida auris NOT DETECTED NOT DETECTED   Candida glabrata NOT DETECTED NOT DETECTED   Candida krusei NOT DETECTED NOT DETECTED   Candida parapsilosis NOT DETECTED NOT DETECTED   Candida tropicalis NOT DETECTED NOT DETECTED   Cryptococcus neoformans/gattii NOT DETECTED NOT DETECTED    Juliette Alcide, PharmD, BCPS.   Work Cell: [phone] 04/23/2021 2:12 PM

## 2021-04-24 DIAGNOSIS — Z515 Encounter for palliative care: Secondary | ICD-10-CM | POA: Diagnosis not present

## 2021-04-24 DIAGNOSIS — J189 Pneumonia, unspecified organism: Secondary | ICD-10-CM | POA: Diagnosis not present

## 2021-04-24 DIAGNOSIS — N390 Urinary tract infection, site not specified: Secondary | ICD-10-CM | POA: Diagnosis not present

## 2021-04-24 DIAGNOSIS — A419 Sepsis, unspecified organism: Secondary | ICD-10-CM | POA: Diagnosis not present

## 2021-04-24 LAB — URINE CULTURE: Culture: >=100000 — AB

## 2021-04-24 NOTE — Progress Notes (Signed)
[facility]) Hospital Liaison Note   Hospice home is able to offer a room today.    Family agreeable to transfer today. Transport set up with [facility] at 11 am. Robbie Lis, RN [facility] Manager aware.   RN please call report to Hospice Home at [phone] prior to patient leaving the unit.  Please send signed and completed DNR with patient at discharge.   Please do not hesitate to call with any hospice related questions.    Thank you for the opportunity to participate in this patient's care.   Bobbie "Einar Gip, RN, BSN [facility] Liaison [phone]

## 2021-04-24 NOTE — Progress Notes (Signed)
Report given to inpatient hospice facility. Patient to be transfer with PIV intact per accepting facility request. EMS transport scheduled from 11am.

## 2021-04-24 NOTE — TOC Transition Note (Signed)
Transition of Care [facility]) - CM/SW Discharge Note   Patient Details  Name: Tiffany Grimes MRN: [id number] Date of Birth: 12-Feb-1929  Transition of Care [facility]) CM/SW Contact:  Allayne Butcher, RN Phone Number: 04/24/2021, 9:31 AM   Clinical Narrative:    [facility] home has accepted patient and has a bed available today.  Patient will transport around 11 am via [facility].   Final next level of care: Hospice Medical Facility Barriers to Discharge: Barriers Resolved   Patient Goals and CMS Choice Patient states their goals for this hospitalization and ongoing recovery are:: Patient's daughter is interested in residential hospice here in [facility].gov Compare Post Acute Care list provided to:: Legal Guardian Choice offered to / list presented to : Adult Children, HC POA / Guardian  Discharge Placement              Patient chooses bed at: Other - please specify in the comment section below: [facility]) Patient to be transferred to facility by: [facility] Name of family member notified: Azhane Eckart Patient and family notified of of transfer: 04/24/21  Discharge Plan and Services   Discharge Planning Services: CM Consult Post Acute Care Choice: Hospice          DME Arranged: N/A DME Agency: NA       HH Arranged: NA HH Agency: NA        Social Determinants of Health (SDOH) Interventions     Readmission Risk Interventions No flowsheet data found.

## 2021-04-24 NOTE — Progress Notes (Signed)
Patient transferred out of facility to inpatient hospice via EMS in stable condition.

## 2021-04-24 NOTE — Discharge Summary (Addendum)
Physician Discharge Summary  Tiffany Grimes KDX:[id number] DOB: 11-15-1928 DOA: 04/22/2021  PCP: Lauro Regulus, MD  Admit date: 04/22/2021 Discharge date: 04/24/2021  Discharge disposition: Hospice home   Recommendations for Outpatient Follow-Up:   Follow-up with hospice team within 24 hours of discharge   Discharge Diagnosis:   Principal Problem:   Sepsis secondary to UTI [facility]) Active Problems:   Hypertension   Presenile dementia with paranoia   Centrilobular emphysema (HCC)   Type 2 diabetes mellitus with stage 3 chronic kidney disease, without long-term current use of insulin (HCC)   End of life care   CAP (community acquired pneumonia)    Discharge Condition: Stable.  Diet recommendation:  Diet Order             Diet general           Diet Heart Room service appropriate? Yes; Fluid consistency: Thin  Diet effective now                     Code Status: DNR     Hospital Course:   Tiffany Grimes is a 87 y.o. female with medical history significant for dementia, type II DM, hypertension, COPD, who was brought from the long-term care facility to the emergency room because of fever, nausea and vomiting.  She was found to have atrial fibrillation with RVR, sepsis secondary to acute UTI and community-acquired pneumonia.  Initially, she was treated with empiric IV antibiotics.  Her family opted for comfort measures with hospice. Evaluated by the hospice team and she was deemed to be a candidate for comfort measures at the hospital. Discharge plan was discussed with her daughter, Tiffany Grimes.      Discharge Exam:    Vitals:   04/23/21 2000 04/23/21 2328 04/24/21 0530 04/24/21 0739  BP:   135/74 132/81  Pulse:   (!) 101 (!) 101  Resp:  20 17 18   Temp:   97.8 F (36.6 C) 97.8 F (36.6 C)  TempSrc:   Oral Oral  SpO2: 95% 95% 94% 94%  Weight:      Height:         GEN: NAD SKIN: Warm and dry EYES: No pallor or icterus ENT: MMM CV:  Irregular rate and rhythm PULM: CTA B ABD: soft, ND, NT, +BS CNS: Alert, nonverbal, non focal EXT: No edema or tenderness   The results of significant diagnostics from this hospitalization (including imaging, microbiology, ancillary and laboratory) are listed below for reference.     Procedures and Diagnostic Studies:   DG Chest Port 1 View  Result Date: 04/22/2021 CLINICAL DATA:  Fever EXAM: PORTABLE CHEST 1 VIEW COMPARISON:  12/25/2018 FINDINGS: Mild cardiomegaly. Extensive airspace opacity asymmetric to the right lung asymmetry and fever history suggesting pneumonia. Trace right pleural effusion is likely. No pneumothorax. IMPRESSION: Extensive airspace disease on the right with history implicating pneumonia. Electronically Signed   By: 02/24/2019 M.D.   On: 04/22/2021 04:10     Labs:   Basic Metabolic Panel: Recent Labs  Lab 04/19/21 2051 04/21/21 1138 04/22/21 0331  NA 137 136 135  K 4.5 4.6 4.5  CL 98 97* 101  CO2 29 30 26   GLUCOSE 147* 135* 155*  BUN 26* 20 22  CREATININE 0.81 0.90 0.90  CALCIUM 8.9 9.3 8.2*   GFR Estimated Creatinine Clearance: 35.4 mL/min (by C-G formula based on SCr of 0.9 mg/dL). Liver Function Tests: Recent Labs  Lab 04/22/21 [phone]  AST 26  ALT 17  ALKPHOS 92  BILITOT 1.4*  PROT 5.3*  ALBUMIN 3.0*   No results for input(s): LIPASE, AMYLASE in the last 168 hours. No results for input(s): AMMONIA in the last 168 hours. Coagulation profile Recent Labs  Lab 04/22/21 0331  INR 2.0*    CBC: Recent Labs  Lab 04/19/21 2051 04/21/21 1138 04/22/21 0331  WBC 7.6 7.7 8.8  NEUTROABS  --   --  8.3*  HGB 12.9 13.5 11.8*  HCT 38.5 41.5 34.6*  MCV 90.0 89.6 89.4  PLT 253 291 260   Cardiac Enzymes: No results for input(s): CKTOTAL, CKMB, CKMBINDEX, TROPONINI in the last 168 hours. BNP: Invalid input(s): POCBNP CBG: Recent Labs  Lab 04/22/21 0844 04/22/21 1152 04/22/21 1654  GLUCAP 162* 167* 187*   D-Dimer No results for  input(s): DDIMER in the last 72 hours. Hgb A1c Recent Labs    04/22/21 0331  HGBA1C 6.6*   Lipid Profile No results for input(s): CHOL, HDL, LDLCALC, TRIG, CHOLHDL, LDLDIRECT in the last 72 hours. Thyroid function studies No results for input(s): TSH, T4TOTAL, T3FREE, THYROIDAB in the last 72 hours.  Invalid input(s): FREET3 Anemia work up No results for input(s): VITAMINB12, FOLATE, FERRITIN, TIBC, IRON, RETICCTPCT in the last 72 hours. Microbiology Recent Results (from the past 240 hour(s))  Urine Culture     Status: Abnormal   Collection Time: 04/21/21  3:38 PM   Specimen: Urine, Clean Catch  Result Value Ref Range Status   Specimen Description   Final    URINE, CLEAN CATCH Performed at [facility], [address] [place], [place] [address]    Special Requests   Final    NONE Performed at [facility], [address] [place], [place] [address]    Culture >=100,000 COLONIES/mL PROTEUS MIRABILIS (A)  Final   Report Status 04/24/2021 FINAL  Final   Organism ID, Bacteria PROTEUS MIRABILIS (A)  Final      Susceptibility   Proteus mirabilis - MIC*    AMPICILLIN <=2 SENSITIVE Sensitive     CEFAZOLIN <=4 SENSITIVE Sensitive     CEFEPIME <=0.12 SENSITIVE Sensitive     CEFTRIAXONE <=0.25 SENSITIVE Sensitive     CIPROFLOXACIN <=0.25 SENSITIVE Sensitive     GENTAMICIN <=1 SENSITIVE Sensitive     IMIPENEM 2 SENSITIVE Sensitive     NITROFURANTOIN 128 RESISTANT Resistant     TRIMETH/SULFA <=20 SENSITIVE Sensitive     AMPICILLIN/SULBACTAM <=2 SENSITIVE Sensitive     PIP/TAZO <=4 SENSITIVE Sensitive     * >=100,000 COLONIES/mL PROTEUS MIRABILIS  Culture, blood (Routine x 2)     Status: None (Preliminary result)   Collection Time: 04/22/21  3:31 AM   Specimen: BLOOD  Result Value Ref Range Status   Specimen Description BLOOD LEFT ARM  Final   Special Requests   Final    BOTTLES DRAWN AEROBIC AND ANAEROBIC Blood Culture adequate volume   Culture   Final     NO GROWTH 2 DAYS Performed at [facility], [address] [place], [place] [address]    Report Status PENDING  Incomplete  Resp Panel by RT-PCR (Flu A&B, Covid) Nasopharyngeal Swab     Status: None   Collection Time: 04/22/21  3:31 AM   Specimen: Nasopharyngeal Swab; Nasopharyngeal(NP) swabs in vial transport medium  Result Value Ref Range Status   SARS Coronavirus 2 by RT PCR NEGATIVE NEGATIVE Final    Comment: (NOTE) SARS-CoV-2 target nucleic acids are NOT DETECTED.  The SARS-CoV-2 RNA is generally detectable in upper respiratory specimens during the acute phase of infection. The lowest concentration of SARS-CoV-2 viral copies this assay can detect is 138 copies/mL. A negative result does not preclude SARS-Cov-2 infection and should not be used as the sole basis for treatment or other patient management decisions. A negative result may occur with  improper specimen collection/handling, submission of specimen other than nasopharyngeal swab, presence of viral mutation(s) within the areas targeted by this assay, and inadequate number of viral copies(<138 copies/mL). A negative result must be combined with clinical observations, patient history, and epidemiological information. The expected result is Negative.  Fact Sheet for Patients:  BloggerCourse.com  Fact Sheet for Healthcare Providers:  SeriousBroker.it  This test is no t yet approved or cleared by the [place] FDA and  has been authorized for detection and/or diagnosis of SARS-CoV-2 by FDA under an Emergency Use Authorization (EUA). This EUA will remain  in effect (meaning this test can be used) for the duration of the COVID-19 declaration under Section 564(b)(1) of the Act, 21 U.S.C.section 360bbb-3(b)(1), unless the authorization is terminated  or revoked sooner.       Influenza A by PCR NEGATIVE NEGATIVE Final   Influenza B by PCR NEGATIVE NEGATIVE  Final    Comment: (NOTE) The Xpert Xpress SARS-CoV-2/FLU/RSV plus assay is intended as an aid in the diagnosis of influenza from Nasopharyngeal swab specimens and should not be used as a sole basis for treatment. Nasal washings and aspirates are unacceptable for Xpert Xpress SARS-CoV-2/FLU/RSV testing.  Fact Sheet for Patients: BloggerCourse.com  Fact Sheet for Healthcare Providers: SeriousBroker.it  This test is not yet approved or cleared by the [place] FDA and has been authorized for detection and/or diagnosis of SARS-CoV-2 by FDA under an Emergency Use Authorization (EUA). This EUA will remain in effect (meaning this test can be used) for the duration of the COVID-19 declaration under Section 564(b)(1) of the Act, 21 U.S.C. section 360bbb-3(b)(1), unless the authorization is terminated or revoked.  Performed at [facility], [address] [place], [place] [address]   Culture, blood (Routine x 2)     Status: None (Preliminary result)   Collection Time: 04/22/21  4:49 AM   Specimen: BLOOD  Result Value Ref Range Status   Specimen Description   Final    BLOOD RIGHT FOREARM Performed at [facility], [address] [place], [place] [address]    Special Requests   Final    Blood Culture results may not be optimal due to an inadequate volume of blood received in culture bottles Performed at [facility], [address], [place], [place] [address]    Culture  Setup Time   Final    GRAM POSITIVE COCCI AEROBIC BOTTLE ONLY CRITICAL RESULT CALLED TO, READ BACK BY AND VERIFIED WITH: C/KRISTIN MERRIS 04/23/21 1238 AMK    Culture   Final    GRAM POSITIVE COCCI CULTURE REINCUBATED FOR BETTER GROWTH Performed at [facility], [address], [place], [place] [address]    Report Status PENDING  Incomplete  Blood Culture ID Panel (Reflexed)     Status: None   Collection Time: 04/22/21   4:49 AM  Result Value Ref Range Status   Enterococcus faecalis NOT DETECTED NOT DETECTED Final   Enterococcus Faecium NOT DETECTED NOT DETECTED Final   Listeria monocytogenes NOT DETECTED NOT DETECTED Final   Staphylococcus species NOT DETECTED NOT DETECTED Final   Staphylococcus aureus (BCID) NOT  DETECTED NOT DETECTED Final   Staphylococcus epidermidis NOT DETECTED NOT DETECTED Final   Staphylococcus lugdunensis NOT DETECTED NOT DETECTED Final   Streptococcus species NOT DETECTED NOT DETECTED Final   Streptococcus agalactiae NOT DETECTED NOT DETECTED Final   Streptococcus pneumoniae NOT DETECTED NOT DETECTED Final   Streptococcus pyogenes NOT DETECTED NOT DETECTED Final   A.calcoaceticus-baumannii NOT DETECTED NOT DETECTED Final   Bacteroides fragilis NOT DETECTED NOT DETECTED Final   Enterobacterales NOT DETECTED NOT DETECTED Final   Enterobacter cloacae complex NOT DETECTED NOT DETECTED Final   Escherichia coli NOT DETECTED NOT DETECTED Final   Klebsiella aerogenes NOT DETECTED NOT DETECTED Final   Klebsiella oxytoca NOT DETECTED NOT DETECTED Final   Klebsiella pneumoniae NOT DETECTED NOT DETECTED Final   Proteus species NOT DETECTED NOT DETECTED Final   Salmonella species NOT DETECTED NOT DETECTED Final   Serratia marcescens NOT DETECTED NOT DETECTED Final   Haemophilus influenzae NOT DETECTED NOT DETECTED Final   Neisseria meningitidis NOT DETECTED NOT DETECTED Final   Pseudomonas aeruginosa NOT DETECTED NOT DETECTED Final   Stenotrophomonas maltophilia NOT DETECTED NOT DETECTED Final   Candida albicans NOT DETECTED NOT DETECTED Final   Candida auris NOT DETECTED NOT DETECTED Final   Candida glabrata NOT DETECTED NOT DETECTED Final   Candida krusei NOT DETECTED NOT DETECTED Final   Candida parapsilosis NOT DETECTED NOT DETECTED Final   Candida tropicalis NOT DETECTED NOT DETECTED Final   Cryptococcus neoformans/gattii NOT DETECTED NOT DETECTED Final    Comment: Performed at  [facility], [address], [place], [place] [address]     Discharge Instructions:   Discharge Instructions     Diet general   Complete by: As directed       Allergies as of 04/24/2021       Reactions   Neomycin Itching        Medication List     STOP taking these medications    amLODipine 5 MG tablet Commonly known as: NORVASC   atenolol 25 MG tablet Commonly known as: TENORMIN   atorvastatin 20 MG tablet Commonly known as: LIPITOR   azelastine 0.1 % nasal spray Commonly known as: ASTELIN   cephALEXin 500 MG capsule Commonly known as: KEFLEX   cloNIDine 0.1 MG tablet Commonly known as: CATAPRES   esomeprazole 40 MG capsule Commonly known as: NEXIUM   furosemide 20 MG tablet Commonly known as: LASIX   hydrOXYzine 25 MG tablet Commonly known as: ATARAX/VISTARIL   latanoprost 0.005 % ophthalmic solution Commonly known as: XALATAN   levothyroxine 100 MCG tablet Commonly known as: SYNTHROID   lisinopril 20 MG tablet Commonly known as: ZESTRIL   LORazepam 0.5 MG tablet Commonly known as: ATIVAN   polyethylene glycol 17 g packet Commonly known as: MIRALAX / GLYCOLAX   QUEtiapine 25 MG tablet Commonly known as: SEROQUEL   sucralfate 1 g tablet Commonly known as: CARAFATE   Xarelto 15 MG Tabs tablet Generic drug: Rivaroxaban           If you experience worsening of your admission symptoms, develop shortness of breath, life threatening emergency, suicidal or homicidal thoughts you must seek medical attention immediately by calling 911 or calling your MD immediately  if symptoms less severe.   You must read complete instructions/literature along with all the possible adverse reactions/side effects for all the medicines you take and that have been prescribed to you. Take any new medicines after you have completely understood and accept all the possible adverse reactions/side  effects.    Please note   You were cared for by a  hospitalist during your hospital stay. If you have any questions about your discharge medications or the care you received while you were in the hospital after you are discharged, you can call the unit and asked to speak with the hospitalist on call if the hospitalist that took care of you is not available. Once you are discharged, your primary care physician will handle any further medical issues. Please note that NO REFILLS for any discharge medications will be authorized once you are discharged, as it is imperative that you return to your primary care physician (or establish a relationship with a primary care physician if you do not have one) for your aftercare needs so that they can reassess your need for medications and monitor your lab values.       Time coordinating discharge: 28 minutes  Signed:  Catherine Cubero  [facility] 04/24/2021, 9:41 AM   Pager on [email or web address]. If 7PM-7AM, please contact night-coverage at [email or web address]

## 2021-04-25 LAB — CULTURE, BLOOD (ROUTINE X 2)

## 2021-04-27 LAB — CULTURE, BLOOD (ROUTINE X 2)
Culture: NO GROWTH
Special Requests: ADEQUATE

## 2021-05-22 DEATH — deceased
